# Patient Record
Sex: Female | Born: 1995 | Race: White | Hispanic: No | Marital: Married | Smoking: Former smoker
Health system: Southern US, Community
[De-identification: ages and names within clinical notes are randomized; demographics above are authoritative.]

## PROBLEM LIST (undated history)

## (undated) ENCOUNTER — Inpatient Hospital Stay (HOSPITAL_COMMUNITY): Payer: Self-pay

## (undated) DIAGNOSIS — O009 Unspecified ectopic pregnancy without intrauterine pregnancy: Secondary | ICD-10-CM

## (undated) DIAGNOSIS — D649 Anemia, unspecified: Secondary | ICD-10-CM

## (undated) DIAGNOSIS — Z3493 Encounter for supervision of normal pregnancy, unspecified, third trimester: Secondary | ICD-10-CM

## (undated) DIAGNOSIS — I491 Atrial premature depolarization: Secondary | ICD-10-CM

## (undated) DIAGNOSIS — I493 Ventricular premature depolarization: Secondary | ICD-10-CM

## (undated) DIAGNOSIS — R51 Headache: Secondary | ICD-10-CM

## (undated) DIAGNOSIS — B999 Unspecified infectious disease: Secondary | ICD-10-CM

## (undated) DIAGNOSIS — F329 Major depressive disorder, single episode, unspecified: Secondary | ICD-10-CM

## (undated) DIAGNOSIS — R002 Palpitations: Secondary | ICD-10-CM

## (undated) DIAGNOSIS — Z3490 Encounter for supervision of normal pregnancy, unspecified, unspecified trimester: Secondary | ICD-10-CM

## (undated) DIAGNOSIS — N159 Renal tubulo-interstitial disease, unspecified: Secondary | ICD-10-CM

## (undated) DIAGNOSIS — F913 Oppositional defiant disorder: Secondary | ICD-10-CM

## (undated) DIAGNOSIS — F419 Anxiety disorder, unspecified: Secondary | ICD-10-CM

## (undated) DIAGNOSIS — F32A Depression, unspecified: Secondary | ICD-10-CM

## (undated) DIAGNOSIS — F988 Other specified behavioral and emotional disorders with onset usually occurring in childhood and adolescence: Secondary | ICD-10-CM

## (undated) DIAGNOSIS — Z20828 Contact with and (suspected) exposure to other viral communicable diseases: Secondary | ICD-10-CM

## (undated) DIAGNOSIS — J45909 Unspecified asthma, uncomplicated: Secondary | ICD-10-CM

## (undated) HISTORY — PX: ANKLE SURGERY: SHX546

## (undated) HISTORY — DX: Oppositional defiant disorder: F91.3

## (undated) HISTORY — DX: Depression, unspecified: F32.A

## (undated) HISTORY — PX: OTHER SURGICAL HISTORY: SHX169

## (undated) HISTORY — PX: WISDOM TOOTH EXTRACTION: SHX21

## (undated) HISTORY — DX: Encounter for supervision of normal pregnancy, unspecified, unspecified trimester: Z34.90

## (undated) HISTORY — DX: Unspecified ectopic pregnancy without intrauterine pregnancy: O00.90

## (undated) HISTORY — DX: Anxiety disorder, unspecified: F41.9

## (undated) HISTORY — DX: Atrial premature depolarization: I49.1

## (undated) HISTORY — DX: Ventricular premature depolarization: I49.3

## (undated) HISTORY — DX: Major depressive disorder, single episode, unspecified: F32.9

## (undated) HISTORY — DX: Contact with and (suspected) exposure to other viral communicable diseases: Z20.828

## (undated) HISTORY — PX: APPENDECTOMY: SHX54

## (undated) HISTORY — DX: Unspecified asthma, uncomplicated: J45.909

## (undated) HISTORY — DX: Palpitations: R00.2

---
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
## 2014-01-30 ENCOUNTER — Ambulatory Visit (HOSPITAL_COMMUNITY): Payer: Self-pay | Admitting: Psychiatry

## 2014-07-23 ENCOUNTER — Other Ambulatory Visit (HOSPITAL_COMMUNITY): Payer: Self-pay | Admitting: Psychiatry

## 2014-08-08 ENCOUNTER — Telehealth (HOSPITAL_COMMUNITY): Payer: Self-pay

## 2014-08-09 ENCOUNTER — Telehealth (HOSPITAL_COMMUNITY): Payer: Self-pay

## 2014-08-09 NOTE — Telephone Encounter (Signed)
Can be seen by adult psychiatrist and can see Forde RadonLeanne Yates for therapy

## 2014-08-21 ENCOUNTER — Ambulatory Visit (INDEPENDENT_AMBULATORY_CARE_PROVIDER_SITE_OTHER): Payer: 59 | Admitting: Psychiatry

## 2014-08-21 ENCOUNTER — Encounter (HOSPITAL_COMMUNITY): Payer: Self-pay | Admitting: Psychiatry

## 2014-08-21 VITALS — BP 112/81 | HR 89 | Ht 63.75 in | Wt 120.8 lb

## 2014-08-21 DIAGNOSIS — F900 Attention-deficit hyperactivity disorder, predominantly inattentive type: Secondary | ICD-10-CM

## 2014-08-21 DIAGNOSIS — F410 Panic disorder [episodic paroxysmal anxiety] without agoraphobia: Secondary | ICD-10-CM

## 2014-08-21 DIAGNOSIS — F331 Major depressive disorder, recurrent, moderate: Secondary | ICD-10-CM | POA: Insufficient documentation

## 2014-08-21 MED ORDER — FLUOXETINE HCL 20 MG PO CAPS: 60.0000 mg | ORAL_CAPSULE | Freq: Every day | ORAL | Status: DC

## 2014-08-21 MED ORDER — HYDROXYZINE HCL 10 MG PO TABS: ORAL_TABLET | ORAL | Status: DC

## 2014-08-21 MED ORDER — TRAZODONE HCL 50 MG PO TABS: 50.0000 mg | ORAL_TABLET | Freq: Every day | ORAL | Status: DC

## 2014-08-21 NOTE — Progress Notes (Signed)
[facility] [id number] Progress Note  Allison Bernard [id number] 18 y.o.   Chief Complaint: I am having a lot of bad panic attacks for last 1.5 months.   History of Present Illness:  Pt is having panic attacks every 2-3 days. Reports SOB, shaking, nausea, crying, headaches, FOI, nervous. Symptoms last for few minutes to hours. During the day she can distract herself but at night it goes on for hours.   Reports stressors including- beauty school, problems with fiance and her family, finances.   Depression comes and goes and is related to her panic attacks. On bad days she feels very low. Reports low self esteem, worthlessness and some anhedonia. Sleep is good with Trazodone. Appetite and energy are variable. Concentration is good.  Irritability is high. Denies AVH. Denies grandiouse thougths and ideas of reference.   Suicidal Ideation: No, last time was one week ago Plan Formed: No Patient has means to carry out plan: No  Homicidal Ideation: No Plan Formed: No Patient has means to carry out plan: No  Review of Systems:Review of Systems  Constitutional: Positive for chills. Negative for fever.  HENT: Positive for congestion and nosebleeds. Negative for sore throat.   Eyes: Negative for blurred vision, double vision and redness.  Respiratory: Negative for cough, sputum production and shortness of breath.   Cardiovascular: Positive for chest pain and palpitations. Negative for leg swelling.  Gastrointestinal: Negative.  Negative for heartburn, nausea, vomiting and abdominal pain.  Musculoskeletal: Positive for joint pain. Negative for back pain and neck pain.  Skin: Negative for itching and rash.  Neurological: Positive for headaches. Negative for dizziness, tremors, seizures, loss of consciousness and weakness.  Psychiatric/Behavioral: Positive for depression. Negative for suicidal ideas, hallucinations and substance abuse. The patient is nervous/anxious. The patient does not  have insomnia.       Past Medical Family, Social History: Pt is in beauty school.She lives on her parents property with her fiance.  reports that she has never smoked. She has never used smokeless tobacco. She reports that she drinks alcohol. She reports that she does not use illicit drugs.  Family History  Problem Relation Age of Onset  . Depression Mother   . Anxiety disorder Mother    Past Medical History  Diagnosis Date  . Anxiety   . Depression   . Oppositional defiant disorder   . ADD (attention deficit disorder)   . Kidney infection   . Headache(784.0)     had migraines when she was younger  . Anemia      Outpatient Encounter Prescriptions as of 08/21/2014  Medication Sig  . FLUoxetine (PROZAC) 40 MG capsule TAKE 1 CAPSULE BY MOUTH DAILY.  Marland Kitchen. Norethindrone-Ethinyl Estradiol-Fe Biphas (LO LOESTRIN FE) 1 MG-10 MCG / 10 MCG tablet Take 1 tablet by mouth daily.  . traZODone (DESYREL) 50 MG tablet TAKE 1 TABLET BY MOUTH AT BEDTIME.  Marland Kitchen. HYDROcodone-acetaminophen (NORCO) 5-325 MG per tablet Take 1 tablet by mouth every 6 (six) hours as needed for pain. (Patient not taking: Reported on 08/21/2014)  . ibuprofen (ADVIL,MOTRIN) 200 MG tablet Take 400 mg by mouth every 6 (six) hours as needed for pain.     Past Psychiatric History/Hospitalization(s): Anxiety: Yes Bipolar Disorder: No Depression: Yes Mania: No Psychosis: No Schizophrenia: No Personality Disorder: No Hospitalization for psychiatric illness: No History of Electroconvulsive Shock Therapy: No Prior Suicide Attempts: No  Physical Exam: Constitutional:  BP 112/81 mmHg  Pulse 89  Ht 5' 3.75" (1.619 m)  Wt 120  lb 12.8 oz (54.795 kg)  BMI 20.90 kg/m2  General Appearance: alert, oriented, no acute distress  Musculoskeletal: Strength & Muscle Tone: within normal limits Gait & Station: normal Patient leans: N/A  Mental Status Examination/Evaluation: Objective: Attitude: Calm and cooperative  Appearance:  Casual, appears to be stated age  Eye Contact::  Good  Speech:  Clear and Coherent and Normal Rate  Volume:  Normal  Mood:  anxious  Affect:  Congruent  Thought Process:  Goal Directed  Orientation:  Full (Time, Place, and Person)  Thought Content:  Negative  Suicidal Thoughts:  No  Homicidal Thoughts:  No  Judgement:  Fair  Insight:  Fair  Concentration: good  Memory: Immediate-fair Recent-fair Remote-fair  Recall: fair  Language: fair  Gait and Station: normal  [employer] of Knowledge: average  Psychomotor Activity:  Normal  Akathisia:  No  Handed:  Right  AIMS (if indicated):  n/a  Assets:  Communication Skills Desire for Improvement Housing Intimacy Leisure Time Physical Health Resilience Social Support Advertising account executiveTalents/Skills Transportation Vocational/Educational     Medical Decision Making (Choose Three): Review of Psycho-Social Stressors (1), Established Problem, Worsening (2), Review of Last Therapy Session (1), Review of Medication Regimen & Side Effects (2) and Review of New Medication or Change in Dosage (2)   Assessment: Axis I: major depressive disorder- recurrent, moderate; 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 Anxiety   . Depression   . Oppositional defiant disorder   . ADD (attention deficit disorder)   . Kidney infection   . Headache(784.0)     had migraines when she was younger  . Anemia      Axis IV: Educational issues, limited coping skills  Axis V: 65   Plan:Continue trazodone 50 mg at bedtime as needed for sleep. Increase Prozac 60 mg qAM for anxiety and depression Start trial of Vistaril 10-20mg  po BID prn anxiety  Medication management with supportive therapy. Risks/benefits and SE of the medication discussed. Pt verbalized understanding and verbal consent obtained for treatment.  Affirm with the patient that the medications  are taken as ordered. Patient expressed understanding of how their medications were to be used.  -worsening of symptoms  Labs: order at next visit  Therapy: brief supportive therapy provided. Discussed psychosocial stressors in detail.   Encouraged to restart individual therapy  Pt denies SI and is at an acute low risk for suicide.Patient told to call clinic if any problems occur. Patient advised to go to ER if they should develop SI/HI, side effects, or if symptoms worsen. Has crisis numbers to call if needed. Pt verbalized understanding.  F/up in 2 months or sooner if needed   Oletta DarterAGARWAL, , MD 08/21/2014

## 2014-08-31 ENCOUNTER — Encounter (HOSPITAL_COMMUNITY): Payer: Self-pay

## 2014-08-31 ENCOUNTER — Emergency Department (INDEPENDENT_AMBULATORY_CARE_PROVIDER_SITE_OTHER)
Admission: EM | Admit: 2014-08-31 | Discharge: 2014-08-31 | Disposition: A | Discharge status: Home / Self Care | Payer: 59 | Source: Home / Self Care | Attending: Family Medicine | Admitting: Family Medicine

## 2014-08-31 DIAGNOSIS — F410 Panic disorder [episodic paroxysmal anxiety] without agoraphobia: Secondary | ICD-10-CM

## 2014-08-31 DIAGNOSIS — F419 Anxiety disorder, unspecified: Secondary | ICD-10-CM

## 2014-08-31 LAB — POCT PREGNANCY, URINE: Preg Test, Ur: NEGATIVE

## 2014-08-31 LAB — POCT URINALYSIS DIP (DEVICE)
Bilirubin Urine: NEGATIVE
GLUCOSE, UA: NEGATIVE mg/dL
Hgb urine dipstick: NEGATIVE
Ketones, ur: NEGATIVE mg/dL
Leukocytes, UA: NEGATIVE
NITRITE: NEGATIVE
PH: 6.5 (ref 5.0–8.0)
Protein, ur: NEGATIVE mg/dL
Specific Gravity, Urine: 1.015 (ref 1.005–1.030)
Urobilinogen, UA: 1 mg/dL (ref 0.0–1.0)

## 2014-08-31 NOTE — ED Notes (Signed)
C/o dizzy , shaky x couple of days . Has a recent dental abscess (started on amoxicillin yesterday)

## 2014-08-31 NOTE — Discharge Instructions (Signed)
Panic Attacks Recommend take a vistaril when you get home Panic attacks are sudden, short-livedsurges of severe anxiety, fear, or discomfort. They may occur for no reason when you are relaxed, when you are anxious, or when you are sleeping. Panic attacks may occur for a number of reasons:   Healthy people occasionally have panic attacks in extreme, life-threatening situations, such as war or natural disasters. Normal anxiety is a protective mechanism of the body that helps us react to danger (fight or flight response).  Panic attacks are often seen with anxiety disorders, such as panic disorder, social anxiety disorder, generalized anxiety disorder, and phobias. Anxiety disorders cause excessive or uncontrollable anxiety. They may interfere with your relationships or other life activities.  Panic attacks are sometimes seen with other mental illnesses, such as depression and posttraumatic stress disorder.  Certain medical conditions, prescription medicines, and drugs of abuse can cause panic attacks. SYMPTOMS  Panic attacks start suddenly, peak within 20 minutes, and are accompanied by four or more of the following symptoms:  Pounding heart or fast heart rate (palpitations).  Sweating.  Trembling or shaking.  Shortness of breath or feeling smothered.  Feeling choked.  Chest pain or discomfort.  Nausea or strange feeling in your stomach.  Dizziness, light-headedness, or feeling like you will faint.  Chills or hot flushes.  Numbness or tingling in your lips or hands and feet.  Feeling that things are not real or feeling that you are not yourself.  Fear of losing control or going crazy.  Fear of dying. Some of these symptoms can mimic serious medical conditions. For example, you may think you are having a heart attack. Although panic attacks can be very scary, they are not life threatening. DIAGNOSIS  Panic attacks are diagnosed through an assessment by your health care  provider. Your health care provider will ask questions about your symptoms, such as where and when they occurred. Your health care provider will also ask about your medical history and use of alcohol and drugs, including prescription medicines. Your health care provider may order blood tests or other studies to rule out a serious medical condition. Your health care provider may refer you to a mental health professional for further evaluation. TREATMENT   Most healthy people who have one or two panic attacks in an extreme, life-threatening situation will not require treatment.  The treatment for panic attacks associated with anxiety disorders or other mental illness typically involves counseling with a mental health professional, medicine, or a combination of both. Your health care provider will help determine what treatment is best for you.  Panic attacks due to physical illness usually go away with treatment of the illness. If prescription medicine is causing panic attacks, talk with your health care provider about stopping the medicine, decreasing the dose, or substituting another medicine.  Panic attacks due to alcohol or drug abuse go away with abstinence. Some adults need professional help in order to stop drinking or using drugs. HOME CARE INSTRUCTIONS   Take all medicines as directed by your health care provider.   Schedule and attend follow-up visits as directed by your health care provider. It is important to keep all your appointments. SEEK MEDICAL CARE IF:  You are not able to take your medicines as prescribed.  Your symptoms do not improve or get worse. SEEK IMMEDIATE MEDICAL CARE IF:   You experience panic attack symptoms that are different than your usual symptoms.  You have serious thoughts about hurting yourself or others.  You are taking medicine for panic attacks and have a serious side effect. MAKE SURE YOU:  Understand these instructions.  Will watch your  condition.  Will get help right away if you are not doing well or get worse. Document Released: 09/14/2005 Document Revised: 09/19/2013 Document Reviewed: 04/28/2013 [facility] Patient Information 2015 [place], [place]. This information is not intended to replace advice given to you by your health care provider. Make sure you discuss any questions you have with your health care provider.

## 2014-08-31 NOTE — ED Provider Notes (Signed)
CSN: [id number]     Arrival date & time 08/31/14  1134 History   First MD Initiated Contact with Patient 08/31/14 1209     Chief Complaint  Patient presents with  . Dizziness   (Consider location/radiation/quality/duration/timing/severity/associated sxs/prior Treatment) HPI Comments: 18 year old female states that while in school, approximately one hour prior to arrival to the urgent care, she developed dizziness and lightheadedness. She felt like she may pass out. She had transient blurring of vision, hyperventilation and tingling of hands. The preponderance of the symptoms last 15-20 minutes. She still has some tingling in the hands and nausea. She endorses vomiting twice during this episode. She feels tired. She has a history of panic attacks and her psychiatrist increased her Prozac from 40 mg to 60 mg just a little over a week ago. In addition, she has a history of depression, oppositional defiant disorder, ADD and headaches. Other medicines include trazodone and Vistaril.   Past Medical History  Diagnosis Date  . Anxiety   . Depression   . Oppositional defiant disorder   . ADD (attention deficit disorder)   . Kidney infection   . Headache(784.0)     had migraines when she was younger  . Anemia    Past Surgical History  Procedure Laterality Date  . Tubes in ears      in the past  . Mass excision Left 07/04/2013    Procedure: EXCISION MASS;  Surgeon: Nadara MustardMarcus V Duda, MD;  [facility];  Service: Orthopedics;  Laterality: Left;  Excision Left Foot Talo-calcaneous fibrous bar  . Ankle surgery     Family History  Problem Relation Age of Onset  . Depression Mother   . Anxiety disorder Mother    History  Substance Use Topics  . Smoking status: Never Smoker   . Smokeless tobacco: Never Used  . Alcohol Use: Yes     Comment: 1 drink a month   OB History    No data available     Review of Systems  Constitutional: Positive for activity change. Negative for fever.  HENT:  Negative.   Eyes: Positive for visual disturbance.  Respiratory: Positive for shortness of breath. Negative for cough and wheezing.   Cardiovascular: Negative for chest pain and leg swelling.  Gastrointestinal: Positive for nausea and vomiting. Negative for abdominal pain.  Genitourinary: Positive for dysuria and urgency.  Musculoskeletal: Negative.   Skin: Negative.   Neurological: Positive for dizziness, light-headedness and numbness. Negative for seizures, syncope, speech difficulty and weakness.  Psychiatric/Behavioral: The patient is nervous/anxious.     Allergies  Review of patient's allergies indicates no known allergies.  Home Medications   Prior to Admission medications   Medication Sig Start Date End Date Taking? Authorizing Provider  FLUoxetine (PROZAC) 20 MG capsule Take 3 capsules (60 mg total) by mouth daily. 08/21/14  Yes Oletta DarterSalina Agarwal, MD  Norethindrone-Ethinyl Estradiol-Fe Biphas (LO LOESTRIN FE) 1 MG-10 MCG / 10 MCG tablet Take 1 tablet by mouth daily.   Yes Historical Provider, MD  traZODone (DESYREL) 50 MG tablet Take 1 tablet (50 mg total) by mouth at bedtime. 08/21/14  Yes Oletta DarterSalina Agarwal, MD  HYDROcodone-acetaminophen (NORCO) 5-325 MG per tablet Take 1 tablet by mouth every 6 (six) hours as needed for pain. Patient not taking: Reported on 08/21/2014 07/04/13   Nadara MustardMarcus Duda V, MD  hydrOXYzine (ATARAX/VISTARIL) 10 MG tablet Take 10-20mg  po BID prn anxiety 08/21/14   Oletta DarterSalina Agarwal, MD  ibuprofen (ADVIL,MOTRIN) 200 MG tablet Take 400 mg by mouth  every 6 (six) hours as needed for pain.     Historical Provider, MD   BP 113/74 mmHg  Pulse 92  Temp(Src) 98.4 F (36.9 C) (Oral)  Resp 14  SpO2 100%  LMP  (LMP Unknown) Physical Exam  Constitutional: She is oriented to person, place, and time. She appears well-developed and well-nourished. No distress.  HENT:  Right Ear: External ear normal.  Left Ear: External ear normal.  Mouth/Throat: Oropharynx is clear and moist.  No oropharyngeal exudate.  Eyes: Conjunctivae and EOM are normal. Pupils are equal, round, and reactive to light.  Neck: Normal range of motion. Neck supple.  Cardiovascular: Normal rate, regular rhythm, normal heart sounds and intact distal pulses.   No murmur heard. Pulmonary/Chest: Effort normal and breath sounds normal. No respiratory distress. She has no wheezes. She has no rales.  Abdominal: Soft. There is no tenderness.  Musculoskeletal: She exhibits no edema or tenderness.  Lymphadenopathy:    She has no cervical adenopathy.  Neurological: She is alert and oriented to person, place, and time. No cranial nerve deficit or sensory deficit. She exhibits normal muscle tone. She displays a negative Romberg sign. Coordination and gait normal.  Skin: Skin is warm and dry.  Psychiatric: She has a normal mood and affect.  Nursing note and vitals reviewed.   ED Course  Procedures (including critical care time) Labs Review Labs Reviewed  POCT PREGNANCY, URINE    Imaging Review No results found.   MDM   1. Panic attacks   2. Anxiety    Sx's typical of panic attacks. Pt did not recognize it as such because she could not identify a particular trigger. Exam unremarkable Home, [facility]    Hayden Rasmussenavid , NP 08/31/14 1253

## 2014-09-06 ENCOUNTER — Ambulatory Visit (HOSPITAL_COMMUNITY): Payer: Self-pay | Admitting: Psychiatry

## 2014-10-04 ENCOUNTER — Telehealth (HOSPITAL_COMMUNITY): Payer: Self-pay

## 2014-10-04 NOTE — Telephone Encounter (Signed)
Called and left voice message for pt to call back.

## 2014-10-11 NOTE — Telephone Encounter (Signed)
Called and left voice message for call back 

## 2014-10-25 ENCOUNTER — Encounter (HOSPITAL_COMMUNITY): Payer: Self-pay | Admitting: Psychiatry

## 2014-10-25 ENCOUNTER — Ambulatory Visit (INDEPENDENT_AMBULATORY_CARE_PROVIDER_SITE_OTHER): Payer: Commercial Indemnity | Admitting: Psychiatry

## 2014-10-25 VITALS — BP 118/82 | HR 93 | Ht 63.5 in | Wt 135.0 lb

## 2014-10-25 DIAGNOSIS — F900 Attention-deficit hyperactivity disorder, predominantly inattentive type: Secondary | ICD-10-CM

## 2014-10-25 DIAGNOSIS — F410 Panic disorder [episodic paroxysmal anxiety] without agoraphobia: Secondary | ICD-10-CM

## 2014-10-25 DIAGNOSIS — F331 Major depressive disorder, recurrent, moderate: Secondary | ICD-10-CM

## 2014-10-25 MED ORDER — PAROXETINE HCL 20 MG PO TABS: 20.0000 mg | ORAL_TABLET | Freq: Every day | ORAL | Status: DC

## 2014-10-25 MED ORDER — TRAZODONE HCL 50 MG PO TABS: 50.0000 mg | ORAL_TABLET | Freq: Every day | ORAL | Status: DC

## 2014-10-25 MED ORDER — HYDROXYZINE HCL 10 MG PO TABS: ORAL_TABLET | ORAL | Status: DC

## 2014-10-25 NOTE — Progress Notes (Signed)
[facility] [id number] Progress Note  Allison Bernard [id number] 19 y.o.   Chief Complaint: I am depressed and anxious  History of Present Illness:  Pt feels depressed at least 5 days a week for a few hours. She feels sad, frustrated and stressed. Reports frequent crying spells and low motivation. She has some anhednoia and isolation. She is having some hopelessness and worthlessness.   Sleep was ok until this week for unknown reasons. Takes Trazodone to help with sleep. She is getting about 4-5 hrs. Energy is low and appetite is increased. Concentration is ok.   Pt is having panic attacks 1-2x/week. They are random and stress induced. She is very anxious in social situations. Feels things are not going her way for the last few months. She takes Vistaril at night to help her calm down.   Reports stressors including- beauty school, problems with fiance and her family, finances, planning wedding and finding a job.   Irritability is a little better. Denies AVH. Denies grandiouse thougths and ideas of reference and compulsions.   Taking Trazodone and Prozac as prescribed and denies SE.   Suicidal Ideation: No Plan Formed: No Patient has means to carry out plan: No  Homicidal Ideation: No Plan Formed: No Patient has means to carry out plan: No  Review of Systems:Review of Systems  Constitutional: Negative for fever and chills.  HENT: Negative for congestion and sore throat.   Eyes: Negative for blurred vision, double vision and redness.  Respiratory: Negative for cough, sputum production and shortness of breath.   Cardiovascular: Negative for chest pain, palpitations and leg swelling.  Gastrointestinal: Negative for heartburn, nausea, vomiting and abdominal pain.  Musculoskeletal: Positive for joint pain. Negative for myalgias, back pain and neck pain.  Skin: Negative for itching and rash.  Neurological: Positive for headaches. Negative for dizziness, sensory change, focal  weakness, seizures and weakness.  Psychiatric/Behavioral: Positive for depression. Negative for suicidal ideas, hallucinations and substance abuse. The patient is nervous/anxious and has insomnia.       Past Medical Family, Social History: Pt is in beauty school.She lives on her parents property with her fiance.  reports that she has never smoked. She has never used smokeless tobacco. She reports that she drinks alcohol. She reports that she does not use illicit drugs.  Family History  Problem Relation Age of Onset  . Depression Mother   . Anxiety disorder Mother    Past Medical History  Diagnosis Date  . Anxiety   . Depression   . Oppositional defiant disorder   . ADD (attention deficit disorder)   . Kidney infection   . Headache(784.0)     had migraines when she was younger  . Anemia      Outpatient Encounter Prescriptions as of 10/25/2014  Medication Sig  . FLUoxetine (PROZAC) 20 MG capsule Take 3 capsules (60 mg total) by mouth daily.  Marland Kitchen HYDROcodone-acetaminophen (NORCO) 5-325 MG per tablet Take 1 tablet by mouth every 6 (six) hours as needed for pain.  . hydrOXYzine (ATARAX/VISTARIL) 10 MG tablet Take 10-20mg  po BID prn anxiety  . ibuprofen (ADVIL,MOTRIN) 200 MG tablet Take 400 mg by mouth every 6 (six) hours as needed for pain.   . Norethindrone-Ethinyl Estradiol-Fe Biphas (LO LOESTRIN FE) 1 MG-10 MCG / 10 MCG tablet Take 1 tablet by mouth daily.  . traZODone (DESYREL) 50 MG tablet Take 1 tablet (50 mg total) by mouth at bedtime.    Past Psychiatric History/Hospitalization(s): Anxiety: Yes Bipolar Disorder: No  Depression: Yes Mania: No Psychosis: No Schizophrenia: No Personality Disorder: No Hospitalization for psychiatric illness: No History of Electroconvulsive Shock Therapy: No Prior Suicide Attempts: No  Physical Exam: Constitutional:  BP 118/82 mmHg  Pulse 93  Ht 5' 3.5" (1.613 m)  Wt 135 lb (61.236 kg)  BMI 23.54 kg/m2  General Appearance: alert,  oriented, no acute distress  Musculoskeletal: Strength & Muscle Tone: within normal limits Gait & Station: normal Patient leans: N/A  Mental Status Examination/Evaluation: Objective: Attitude: Calm and cooperative  Appearance: Casual, appears to be stated age  Eye Contact::  Good  Speech:  Clear and Coherent and Normal Rate  Volume:  Normal  Mood:  Anxious and depressed  Affect:  Congruent  Thought Process:  Goal Directed  Orientation:  Full (Time, Place, and Person)  Thought Content:  Negative  Suicidal Thoughts:  No  Homicidal Thoughts:  No  Judgement:  Fair  Insight:  Fair  Concentration: good  Memory: Immediate-fair Recent-fair Remote-fair  Recall: fair  Language: fair  Gait and Station: normal  [employer] of Knowledge: average  Psychomotor Activity:  Normal  Akathisia:  No  Handed:  Right  AIMS (if indicated):  n/a  Assets:  Communication Skills Desire for Improvement Housing Intimacy Leisure Time Physical Health Resilience Social Support Advertising account executiveTalents/Skills Transportation Vocational/Educational     Medical Decision Making (Choose Three): Review of Psycho-Social Stressors (1), Established Problem, Worsening (2), Review of Last Therapy Session (1), Review of Medication Regimen & Side Effects (2) and Review of New Medication or Change in Dosage (2)   Assessment: Axis I: major depressive disorder- recurrent, moderate; 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 Anxiety   . Depression   . Oppositional defiant disorder   . ADD (attention deficit disorder)   . Kidney infection   . Headache(784.0)     had migraines when she was younger  . Anemia      Axis IV: Educational issues, limited coping skills  Axis V: 65   Plan:Continue trazodone 50 mg at bedtime as needed for sleep. D/c Prozac as pt feels it is no longer effective Start trial of  Paxil  20mg  qAM for anxiety and depression Vistaril 10-20mg  po BID prn anxiety  Medication management with supportive therapy. Risks/benefits and SE of the medication discussed. Pt verbalized understanding and verbal consent obtained for treatment.  Affirm with the patient that the medications are taken as ordered. Patient expressed understanding of how their medications were to be used.  -worsening of symptoms  Labs: order at next visit  Therapy: brief supportive therapy provided. Discussed psychosocial stressors in detail.   Encouraged to restart individual therapy  Pt denies SI and is at an acute low risk for suicide.Patient told to call clinic if any problems occur. Patient advised to go to ER if they should develop SI/HI, side effects, or if symptoms worsen. Has crisis numbers to call if needed. Pt verbalized understanding.  F/up in 2 months or sooner if needed   Oletta DarterAGARWAL, , MD 10/25/2014

## 2014-12-27 ENCOUNTER — Ambulatory Visit (HOSPITAL_COMMUNITY): Payer: Self-pay | Admitting: Psychiatry

## 2015-01-03 ENCOUNTER — Ambulatory Visit (INDEPENDENT_AMBULATORY_CARE_PROVIDER_SITE_OTHER): Payer: Commercial Indemnity | Admitting: Psychiatry

## 2015-01-03 ENCOUNTER — Encounter (HOSPITAL_COMMUNITY): Payer: Self-pay | Admitting: Psychiatry

## 2015-01-03 VITALS — BP 113/74 | HR 86 | Ht 63.0 in | Wt 146.8 lb

## 2015-01-03 DIAGNOSIS — Z0000 Encounter for general adult medical examination without abnormal findings: Secondary | ICD-10-CM

## 2015-01-03 DIAGNOSIS — F410 Panic disorder [episodic paroxysmal anxiety] without agoraphobia: Secondary | ICD-10-CM

## 2015-01-03 DIAGNOSIS — F331 Major depressive disorder, recurrent, moderate: Secondary | ICD-10-CM

## 2015-01-03 DIAGNOSIS — F900 Attention-deficit hyperactivity disorder, predominantly inattentive type: Secondary | ICD-10-CM | POA: Diagnosis not present

## 2015-01-03 MED ORDER — PAROXETINE HCL 40 MG PO TABS: 40.0000 mg | ORAL_TABLET | Freq: Every day | ORAL | Status: DC

## 2015-01-03 MED ORDER — HYDROXYZINE HCL 10 MG PO TABS: ORAL_TABLET | ORAL | Status: DC

## 2015-01-03 MED ORDER — TRAZODONE HCL 100 MG PO TABS: 100.0000 mg | ORAL_TABLET | Freq: Every day | ORAL | Status: DC

## 2015-01-03 NOTE — Progress Notes (Signed)
Patient ID: Allison KocherLauren A Bernard, female   DOB: 11/02/1995, 19 y.o.   MRN: [id number]  [facility] [id number] Progress Note  Allison KocherLauren A Bernard [id number] 19 y.o.   Chief Complaint: I am overall good  History of Present Illness:  Pt has graduated and [place] She has been pretty stressed out. Her uncle passed away suddenly earlier this week. She was not close to him and feels sad watching her dad feeling down.   Pt feels depressed at least 5 days a week for a few hours but is more situational. She feels sad, frustrated and stressed. Reports frequent crying spells and low motivation. She has some anhednoia and isolation. She is having some hopelessness and worthlessness. Overall depression has not changed much. States she has noticed much change with Paxil.   Sleep was ok until this week for unknown reasons. Takes Trazodone to help with sleep but it is not working as well as it used to. She is getting about 5-6 hrs. Energy is slowly improving and appetite is increased. Pt states she is gaining weight (10 lbs in 3 months). Pt thinks she might be stress eating and lack of activity.  Concentration is ok.   Pt is having panic attacks 1-2x/week. They are random and stress induced. She is very anxious in social situations. Feels things are not going her way for the last few months. She takes Vistaril at night to help her calm down. She can't take it during the day b/c it makes her tired.   Irritability is a little unchanged. Denies AVH. Denies grandiouse thougths and ideas of reference and compulsions.   Taking Trazodone and Paxil as prescribed and denies SE.   Suicidal Ideation: No Plan Formed: No Patient has means to carry out plan: No  Homicidal Ideation: No Plan Formed: No Patient has means to carry out plan: No  Review of Systems:Review of Systems  Constitutional: Negative for fever, chills and weight loss.  HENT: Positive for congestion and  sore throat. Negative for ear pain and hearing loss.   Eyes: Negative for blurred vision, double vision and pain.  Respiratory: Positive for cough. Negative for sputum production and wheezing.   Cardiovascular: Negative for chest pain, palpitations and leg swelling.  Gastrointestinal: Negative for heartburn, nausea, vomiting and abdominal pain.  Musculoskeletal: Positive for myalgias and joint pain. Negative for back pain and neck pain.  Skin: Negative for itching and rash.  Neurological: Negative for dizziness, sensory change, seizures, loss of consciousness and headaches.  Psychiatric/Behavioral: Positive for depression. Negative for suicidal ideas, hallucinations and substance abuse. The patient is nervous/anxious and has insomnia.       Past Medical Family, Social History: [place] She lives [place] her parents property with her fiance.  reports that she has never smoked. She has never used smokeless tobacco. She reports that she drinks alcohol. She reports that she does not use illicit drugs.  Family History  Problem Relation Age of Onset  . Depression Mother   . Anxiety disorder Mother    Past Medical History  Diagnosis Date  . Anxiety   . Depression   . Oppositional defiant disorder   . ADD (attention deficit disorder)   . Kidney infection   . Headache(784.0)     had migraines when she was younger  . Anemia      Outpatient Encounter Prescriptions as of 01/03/2015  Medication Sig  .  HYDROcodone-acetaminophen (NORCO) 5-325 MG per tablet Take 1 tablet by mouth every 6 (six) hours as needed for pain.  . hydrOXYzine (ATARAX/VISTARIL) 10 MG tablet Take 10-20mg  po BID prn anxiety  . ibuprofen (ADVIL,MOTRIN) 200 MG tablet Take 400 mg by mouth every 6 (six) hours as needed for pain.   . Norethindrone-Ethinyl Estradiol-Fe Biphas (LO LOESTRIN FE) 1 MG-10 MCG / 10 MCG tablet Take 1 tablet by mouth daily.  Marland Kitchen PARoxetine (PAXIL) 20 MG tablet Take 1 tablet (20  mg total) by mouth daily.  . traZODone (DESYREL) 50 MG tablet Take 1 tablet (50 mg total) by mouth at bedtime.    Past Psychiatric History/Hospitalization(s): Anxiety: Yes Bipolar Disorder: No Depression: Yes Mania: No Psychosis: No Schizophrenia: No Personality Disorder: No Hospitalization for psychiatric illness: No History of Electroconvulsive Shock Therapy: No Prior Suicide Attempts: No  Physical Exam: Constitutional:  BP 113/74 mmHg  Pulse 86  Ht  (1.6 m)  Wt 146 lb 12.8 oz (66.588 kg)  BMI 26.01 kg/m2  General Appearance: alert, oriented, no acute distress  Musculoskeletal: Strength & Muscle Tone: within normal limits Gait & Station: normal Patient leans: N/A  Mental Status Examination/Evaluation: Objective: Attitude: Calm and cooperative  Appearance: Casual, appears to be stated age  Eye Contact::  Good  Speech:  Clear and Coherent and Normal Rate  Volume:  Normal  Mood:  Anxious and depressed  Affect:  Congruent  Thought Process:  Goal Directed  Orientation:  Full (Time, Place, and Person)  Thought Content:  Negative  Suicidal Thoughts:  No  Homicidal Thoughts:  No  Judgement:  Fair  Insight:  Fair  Concentration: good  Memory: Immediate-fair Recent-fair Remote-fair  Recall: fair  Language: fair  Gait and Station: normal  [employer] of Knowledge: average  Psychomotor Activity:  Normal  Akathisia:  No  Handed:  Right  AIMS (if indicated):  n/a  Assets:  Communication Skills Desire for Improvement Housing Intimacy Leisure Time Physical Health Resilience Social Support Advertising account executive (Choose Three): Review of Psycho-Social Stressors (1), Review or order clinical lab tests (1), Established Problem, Worsening (2), Review of Last Therapy Session (1), Review of Medication Regimen & Side Effects (2) and Review of New Medication or Change in Dosage (2)   Assessment: Axis  I: major depressive disorder- recurrent, moderate; 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 Anxiety   . Depression   . Oppositional defiant disorder   . ADD (attention deficit disorder)   . Kidney infection   . Headache(784.0)     had migraines when she was younger  . Anemia      Axis IV: Educational issues, limited coping skills  Axis V: 65   Plan: Increase Trazodone to  at bedtime as needed for sleep. Increase Paxil to  qAM for anxiety and depression. Pt is not pregnant and is not actively trying to get pregnant at this time but is concerned about the possibility. We discussed teratogenic risks and pt  Verbalized understanding and opted to continue. She will inform us if she gets pregnant and plans to stop using all MH meds at that point in time.  Vistaril 10-20mg  po BID prn anxiety  Medication management with supportive therapy. Risks/benefits and SE of the medication discussed. Pt verbalized understanding and verbal consent obtained for treatment.  Affirm with the patient that the medications are  taken as ordered. Patient expressed understanding of how their medications were to be used.  -worsening of symptoms  Labs: ordered CBC, CMP, TSH, UDS  Therapy: brief supportive therapy provided. Discussed psychosocial stressors in detail.   Encouraged to restart individual therapy  Pt denies SI and is at an acute low risk for suicide.Patient told to call clinic if any problems occur. Patient advised to go to ER if they should develop SI/HI, side effects, or if symptoms worsen. Has crisis numbers to call if needed. Pt verbalized understanding.  F/up in 2 months or sooner if needed   Oletta Darter, MD 01/03/2015

## 2015-03-05 ENCOUNTER — Encounter (HOSPITAL_COMMUNITY): Payer: Self-pay | Admitting: Psychiatry

## 2015-03-05 ENCOUNTER — Ambulatory Visit (INDEPENDENT_AMBULATORY_CARE_PROVIDER_SITE_OTHER): Payer: Commercial Indemnity | Admitting: Psychiatry

## 2015-03-05 VITALS — BP 131/81 | HR 92 | Ht 63.0 in | Wt 150.0 lb

## 2015-03-05 DIAGNOSIS — F410 Panic disorder [episodic paroxysmal anxiety] without agoraphobia: Secondary | ICD-10-CM | POA: Diagnosis not present

## 2015-03-05 DIAGNOSIS — F900 Attention-deficit hyperactivity disorder, predominantly inattentive type: Secondary | ICD-10-CM

## 2015-03-05 DIAGNOSIS — F988 Other specified behavioral and emotional disorders with onset usually occurring in childhood and adolescence: Secondary | ICD-10-CM

## 2015-03-05 DIAGNOSIS — F331 Major depressive disorder, recurrent, moderate: Secondary | ICD-10-CM

## 2015-03-05 MED ORDER — TRAZODONE HCL 100 MG PO TABS
100.0000 mg | ORAL_TABLET | Freq: Every day | ORAL | Status: DC
Start: 2015-03-05 — End: 2015-07-25

## 2015-03-05 MED ORDER — PAROXETINE HCL 40 MG PO TABS: 60.0000 mg | ORAL_TABLET | Freq: Every day | ORAL | Status: DC

## 2015-03-05 MED ORDER — HYDROXYZINE HCL 10 MG PO TABS: ORAL_TABLET | ORAL | Status: DC

## 2015-03-05 NOTE — Progress Notes (Signed)
BH MD/PA/NP OP Progress Note  03/05/2015 9:43 AM Allison Bernard  MRN:  [id number]  Subjective:   Pt has been stressed due to her new job. She is training and they are moving her between 3 locations.   She still has 1-2 bad days a week of depression. On those days she is overwhelmed and is scared about her progress and future. She questions everything about herself. She feels tired and wants to spend her day in bed. On those days she has to force herself to do things. She is motivated at work but nothing else. Reports isolation and crying spells on bad days. Anehdonia remains. Denies worthlessness and hopelessness. Denies SI/HI.   Pt is stress eating and is concerned about her weight. Sleep is good with Vistaril and Trazodone. Energy is low.   Concentration is ok and she able to focus at work.   When overwhelmed and on bad days pt has stress induced panic attacks.   Irritability is a little unchanged. Denies AVH. Denies grandiouse thougths and ideas of reference and compulsions.   Taking meds as prescribed and denies SE.   Chief Complaint: good Chief Complaint    Follow-up     Visit Diagnosis:     ICD-9-CM ICD-10-CM   1. Panic disorder without agoraphobia 300.01 F41.0 hydrOXYzine (ATARAX/VISTARIL) 10 MG tablet     PARoxetine (PAXIL) 40 MG tablet  2. Major depressive disorder, recurrent episode, moderate 296.32 F33.1 PARoxetine (PAXIL) 40 MG tablet     traZODone (DESYREL) 100 MG tablet  3. 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 Anxiety   . Depression   . Oppositional defiant disorder   . ADD (attention deficit disorder)   . Kidney infection   . Headache(784.0)     had migraines when she was younger  . Anemia     Past Surgical History  Procedure Laterality Date  . Tubes in ears      in the past  . Mass excision Left 07/04/2013    Procedure: EXCISION MASS;  Surgeon: Nadara Mustard, MD;  Location: [facility] OR;  Service: Orthopedics;  Laterality: Left;  Excision Left Foot Talo-calcaneous fibrous bar  . Ankle surgery    . Wisdom tooth extraction     Family History:  Family History  Problem Relation Age of Onset  . Depression Mother   . Anxiety disorder Mother    Social History:  History   Social History  . Marital Status: Single    Spouse Name: N/A  . Number of Children: N/A  . Years of Education: N/A   Social History Main Topics  . Smoking status: Never Smoker   . Smokeless tobacco: Never Used  . Alcohol Use: Yes     Comment: 1 drink a month  . Drug Use: No  . Sexual Activity: Yes    Birth Control/ Protection: Condom, Pill   Other Topics Concern  . 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 She lives on her parents property with her fiance.  reports that she has never smoked. She has never used smokeless tobacco. She reports that she drinks alcohol. She reports that she does not use illicit drugs. Past Psychiatric History/Hospitalization(s): Anxiety: Yes Bipolar Disorder: No Depression: Yes Mania: No Psychosis: No Schizophrenia: No Personality Disorder: No Hospitalization for psychiatric illness: No History of Electroconvulsive Shock  Therapy: No Prior Suicide Attempts: No   Assessment:   Musculoskeletal: Strength & Muscle Tone: within normal limits Gait & Station: normal Patient leans: N/A  Psychiatric Specialty Exam: HPI  Review of Systems  Constitutional: Negative for fever, chills and weight loss.  HENT: Negative for congestion and sore throat.   Eyes: Negative for blurred vision, double vision and redness.  Respiratory: Negative for cough, shortness of breath and wheezing.   Cardiovascular: Negative for chest pain, palpitations and leg swelling.  Gastrointestinal: Negative for heartburn, nausea, vomiting and abdominal pain.  Musculoskeletal: Negative for back pain, joint  pain and neck pain.  Skin: Negative for itching and rash.  Neurological: Negative for dizziness, seizures, loss of consciousness and headaches.  Psychiatric/Behavioral: Positive for depression. Negative for suicidal ideas, hallucinations and substance abuse. The patient is nervous/anxious. The patient does not have insomnia.     Blood pressure 131/81, pulse 92, height 5\' 3"  (1.6 m), weight 150 lb (68.04 kg).Body mass index is 26.58 kg/(m^2).  General Appearance: Fairly Groomed  Eye Contact:  Good  Speech:  Clear and Coherent and Normal Rate  Volume:  Normal  Mood:  Depressed  Affect:  Congruent  Thought Process:  Goal Directed, Linear and Logical  Orientation:  Full (Time, Place, and Person)  Thought Content:  Negative  Suicidal Thoughts:  No  Homicidal Thoughts:  No  Memory:  Immediate;   Good Recent;   Good Remote;   Good  Judgement:  Good  Insight:  Good  Psychomotor Activity:  Normal  Concentration:  Good  Recall:  Good  Fund of Knowledge: Good  Language: Good  Akathisia:  No  Handed:  Right  AIMS (if indicated):  n/a  Assets:  Communication Skills Desire for Improvement Housing Intimacy Leisure Time Physical Health Resilience Social Support Talents/Skills Transportation Vocational/Educational  ADL's:  Intact  Cognition: WNL  Sleep:  good   Is the patient at risk to self?  No. Has the patient been a risk to self in the past 6 months?  No. Has the patient been a risk to self within the distant past?  No. Is the patient a risk to others?  No. Has the patient been a risk to others in the past 6 months?  No. Has the patient been a risk to others within the distant past?  No.  Current Medications: Current Outpatient Prescriptions  Medication Sig Dispense Refill  . HYDROcodone-acetaminophen (NORCO) 5-325 MG per tablet Take 1 tablet by mouth every 6 (six) hours as needed for pain. 30 tablet 0  . hydrOXYzine (ATARAX/VISTARIL) 10 MG tablet Take 10-20mg  po BID prn  anxiety 120 tablet 1  . ibuprofen (ADVIL,MOTRIN) 200 MG tablet Take 400 mg by mouth every 6 (six) hours as needed for pain.     . Norethindrone-Ethinyl Estradiol-Fe Biphas (LO LOESTRIN FE) 1 MG-10 MCG / 10 MCG tablet Take 1 tablet by mouth daily.    Marland Kitchen. PARoxetine (PAXIL) 40 MG tablet Take 1 tablet (40 mg total) by mouth daily. 30 tablet 1  . traZODone (DESYREL) 100 MG tablet Take 1 tablet (100 mg total) by mouth at bedtime. 30 tablet 1   No current facility-administered medications for this visit.    Medical Decision Making:  Established Problem, Stable/Improving (1), Review of Psycho-Social Stressors (1), Review or order clinical lab tests (1), Established Problem, Worsening (2), Review of Medication Regimen & Side Effects (2) and Review of New Medication or Change in Dosage (2)  Treatment Plan Summary:Medication management and Plan see  below  Axis I: Major depressive disorder- recurrent, moderate; Panic disorder without agorophobia; ADHD inattentive type moderate severity  Axis II: Deferred   Plan: Trazodone  at bedtime as needed for sleep. Increase Paxil to  qAM for anxiety and depression. Pt is not pregnant and is not actively trying to get pregnant at this time but is concerned about the possibility. We discussed teratogenic risks and pt Verbalized understanding and opted to continue. She will inform us if she gets pregnant and plans to stop using all MH meds at that point in time.  Vistaril 10-20mg  po BID prn anxiety Medication management with supportive therapy. Risks/benefits and SE of the medication discussed. Pt verbalized understanding and verbal consent obtained for treatment. Affirm with the patient that the medications are taken as ordered. Patient expressed understanding of how their medications were to be used.  -worsening of depression symptoms  Labs: ordered CBC, CMP, TSH, UDS. Pt reminded to get labs done   Therapy: brief supportive therapy provided.  Discussed psychosocial stressors in detail.  Encouraged to restart individual therapy  Pt denies SI and is at an acute low risk for suicide.Patient told to call clinic if any problems occur. Patient advised to go to ER if they should develop SI/HI, side effects, or if symptoms worsen. Has crisis numbers to call if needed. Pt verbalized understanding.  F/up in 6 months or sooner if needed           ,  03/05/2015, 9:43 AM

## 2015-03-30 ENCOUNTER — Inpatient Hospital Stay (HOSPITAL_COMMUNITY)
Admission: AD | Admit: 2015-03-30 | Discharge: 2015-03-30 | Disposition: A | Discharge status: Home / Self Care | Payer: Managed Care, Other (non HMO) | Source: Ambulatory Visit | Attending: Obstetrics and Gynecology | Admitting: Obstetrics and Gynecology

## 2015-03-30 ENCOUNTER — Encounter (HOSPITAL_COMMUNITY): Payer: Self-pay

## 2015-03-30 DIAGNOSIS — O009 Ectopic pregnancy, unspecified: Secondary | ICD-10-CM | POA: Diagnosis present

## 2015-03-30 LAB — CBC WITH DIFFERENTIAL/PLATELET
BASOS ABS: 0.1 10*3/uL (ref 0.0–0.1)
Basophils Relative: 1 % (ref 0–1)
Eosinophils Absolute: 0.1 10*3/uL (ref 0.0–0.7)
Eosinophils Relative: 1 % (ref 0–5)
HEMATOCRIT: 40.5 % (ref 36.0–46.0)
HEMOGLOBIN: 13.7 g/dL (ref 12.0–15.0)
LYMPHS ABS: 1.8 10*3/uL (ref 0.7–4.0)
Lymphocytes Relative: 23 % (ref 12–46)
MCH: 27.6 pg (ref 26.0–34.0)
MCHC: 33.8 g/dL (ref 30.0–36.0)
MCV: 81.5 fL (ref 78.0–100.0)
MONO ABS: 0.5 10*3/uL (ref 0.1–1.0)
Monocytes Relative: 6 % (ref 3–12)
NEUTROS ABS: 5.5 10*3/uL (ref 1.7–7.7)
Neutrophils Relative %: 69 % (ref 43–77)
PLATELETS: 317 10*3/uL (ref 150–400)
RBC: 4.97 MIL/uL (ref 3.87–5.11)
RDW: 13 % (ref 11.5–15.5)
WBC: 7.9 10*3/uL (ref 4.0–10.5)

## 2015-03-30 LAB — ABO/RH
ABO/RH(D): O POS
Weak D: POSITIVE

## 2015-03-30 LAB — BUN: BUN: 11 mg/dL (ref 6–20)

## 2015-03-30 LAB — CREATININE, SERUM
Creatinine, Ser: 0.93 mg/dL (ref 0.44–1.00)
GFR calc non Af Amer: >60 mL/min (ref >60)

## 2015-03-30 LAB — AST: AST: 16 U/L (ref 15–41)

## 2015-03-30 LAB — HCG, QUANTITATIVE, PREGNANCY: hCG, Beta Chain, Quant, S: 158 m[IU]/mL — ABNORMAL HIGH (ref <5)

## 2015-03-30 MED ORDER — METHOTREXATE INJECTION FOR WOMEN'S HOSPITAL
50.0000 mg/m2 | Freq: Once | INTRAMUSCULAR | Status: AC
  Administered 2015-03-30: 85 mg via INTRAMUSCULAR
  Filled 2015-03-30: qty 1.7

## 2015-03-30 NOTE — MAU Note (Signed)
Urine in lab 

## 2015-03-30 NOTE — Discharge Instructions (Signed)
Return to Maternity Admissions Wednesday and Saturday for repeat BHCG   Methotrexate Treatment for an Ectopic Pregnancy Methotrexate is a medicine that treats ectopic pregnancy by stopping the growth of the fertilized egg. It also helps your body absorb tissue from the egg. This takes between 2 weeks and 6 weeks. Most ectopic pregnancies can be successfully treated with methotrexate if they are detected early enough. LET [facility] PROVIDER KNOW ABOUT:  Any allergies you have.  All medicines you are taking, including vitamins, herbs, eye drops, creams, and over-the-counter medicines.  Medical conditions you have. RISKS AND COMPLICATIONS Generally, this is a safe treatment. However, as with any treatment, problems can occur. Possible problems or side effects include:  Nausea.  Vomiting.  Diarrhea.  Abdominal cramping.  Mouth sores.  Increased vaginal bleeding or spotting.   Swelling or irritation of the lining of your lungs (pneumonitis).  Failed treatment and continuation of the pregnancy.   Liver damage.  Hair loss. There is still a risk of the ectopic pregnancy rupturing while using the methotrexate. BEFORE THE PROCEDURE Before you take the medicine:   Liver tests, kidney tests, and a complete blood test are performed.  Blood tests are performed to measure the pregnancy hormone levels and to determine your blood type.  If you are Rh-negative and the father is Rh-positive or his Rh type is not known, you will be given a Rho (D) immune globulin shot. PROCEDURE  There are two methods that your health care provider may use to prescribe methotrexate. One method involves a single dose or injection of the medicine. Another method involves a series of doses given through several injections.  AFTER THE PROCEDURE  You may have some abdominal cramping, vaginal bleeding, and fatigue in the first few days after taking methotrexate.  Blood tests will be taken for several  weeks to check the pregnancy hormone levels. The blood tests are performed until there is no more pregnancy hormone detected in the blood. Document Released: 09/08/2001 Document Revised: 01/29/2014 Document Reviewed: 07/03/2013 [facility] Patient Information 2015 [place]ExitCare, [place]LL[place]. This information is not intended to replace advice given to you by your health care provider. Make sure you discuss any questions you have with your health care provider.

## 2015-03-30 NOTE — MAU Note (Signed)
Pt states was seen in office yesterday. Dx'd with ectopic Wednesday, and went back to office yesterday. Was told to come to MAU for MTX. No pain. Has r sided ectopic.

## 2015-03-30 NOTE — Progress Notes (Addendum)
Notified of pt's lab results, will place order for MTX and have pt return to MAU day 4 and 7 for repeat BHCG.

## 2015-03-30 NOTE — Progress Notes (Signed)
Notified pt here for MTX. Orders obtained, will call with lab results.

## 2015-03-30 NOTE — MAU Provider Note (Signed)
History     CSN: [id number]  Arrival date and time: 03/30/15 1027   None     Chief Complaint  Patient presents with  . Sent from office for MTX    HPI Comments: Pt sent from office for MTX treatment of suspected ectopic pregnancy.  Pt initially seen with +UPT, conceived on OCP's.  A beta quant was noted to be 165 on 03/27/15 and and US performed showed  Nothing in the uterus and a right adnexal mass measuring 1.5 x 1.6 x 1.3 cm.  The patient reports intermittent right LQ pain but nothing severe or persistent.  She had a f/u beta on 03/29/15 which dropped to 158 c/w a failed pregnancy.  She stopped her OCP's 6/29 and began to have a heavy period-like bleed.  She presents this AM for MTX and the beta is unchanged at 158 despite her bleeding.  She reports no severe pain.  Pt will proceed with MTX today and return for Day 4 and Day 7 beta levels.  Ectopic precautions have been reviewed with patient.       Past Medical History  Diagnosis Date  . Anxiety   . Depression   . Oppositional defiant disorder   . ADD (attention deficit disorder)   . Kidney infection   . Headache(784.0)     had migraines when she was younger  . Anemia     Past Surgical History  Procedure Laterality Date  . Tubes in ears      in the past  . Mass excision Left 07/04/2013    Procedure: EXCISION MASS;  Surgeon: Nadara MustardMarcus V Duda, MD;  [facility];  Service: Orthopedics;  Laterality: Left;  Excision Left Foot Talo-calcaneous fibrous bar  . Ankle surgery    . Wisdom tooth extraction      Family History  Problem Relation Age of Onset  . Depression Mother   . Anxiety disorder Mother     History  Substance Use Topics  . Smoking status: Never Smoker   . Smokeless tobacco: Never Used  . Alcohol Use: Yes     Comment: 1 drink a month    Allergies: No Known Allergies  Prescriptions prior to admission  Medication Sig Dispense Refill Last Dose  . HYDROcodone-acetaminophen (NORCO) 5-325 MG per tablet Take 1  tablet by mouth every 6 (six) hours as needed for pain. 30 tablet 0 Taking  . hydrOXYzine (ATARAX/VISTARIL) 10 MG tablet Take 10-20mg  po BID prn anxiety 60 tablet 5   . ibuprofen (ADVIL,MOTRIN) 200 MG tablet Take 400 mg by mouth every 6 (six) hours as needed for pain.    Taking  . Norethindrone-Ethinyl Estradiol-Fe Biphas (LO LOESTRIN FE) 1 MG-10 MCG / 10 MCG tablet Take 1 tablet by mouth daily.   Taking  . PARoxetine (PAXIL) 40 MG tablet Take 1.5 tablets (60 mg total) by mouth daily. 45 tablet 5   . traZODone (DESYREL) 100 MG tablet Take 1 tablet (100 mg total) by mouth at bedtime. 30 tablet 5     ROS Physical Exam   Blood pressure 119/77, pulse 89, temperature 98.4 F (36.9 C), temperature source Oral, resp. rate 18, height 5' 3.25" (1.607 m), weight 66.452 kg (146 lb 8 oz), last menstrual period 03/10/2015.  Physical Exam  Per MAU provider, medical screening exam WNL  MAU Course  Procedures     Assessment and Plan  Pt's labs WNL and beta 158, unchanged from yesterday despite VB.  Will proceed with MTX and f/u levels  day 4 and day 7.  Oliver Pila 03/30/2015, 12:35 PM

## 2015-04-03 ENCOUNTER — Inpatient Hospital Stay (HOSPITAL_COMMUNITY)
Admission: AD | Admit: 2015-04-03 | Discharge: 2015-04-03 | Disposition: A | Discharge status: Home / Self Care | Payer: Managed Care, Other (non HMO) | Source: Ambulatory Visit | Attending: Obstetrics and Gynecology | Admitting: Obstetrics and Gynecology

## 2015-04-03 DIAGNOSIS — O009 Unspecified ectopic pregnancy without intrauterine pregnancy: Secondary | ICD-10-CM

## 2015-04-03 LAB — HCG, QUANTITATIVE, PREGNANCY: HCG, BETA CHAIN, QUANT, S: 168 m[IU]/mL — AB (ref <5)

## 2015-04-03 NOTE — MAU Note (Signed)
Pt here for day 4 post MTX.  States is doing good, needs to leave for work.  Denies pain. Is now just spotting

## 2015-04-03 NOTE — Discharge Instructions (Signed)
Ectopic Pregnancy °An ectopic pregnancy happens when a fertilized egg grows outside the uterus. A pregnancy cannot live outside of the uterus. This problem often happens in the fallopian tube. It is often caused by damage to the fallopian tube. °If this problem is found early, you may be treated with medicine. If your tube tears or bursts open (ruptures), you will bleed inside. This is an emergency. You will need surgery. Get help right away.  °SYMPTOMS °You may have normal pregnancy symptoms at first. These include: °· Missing your period. °· Feeling sick to your stomach (nauseous). °· Being tired. °· Having tender breasts. °Then, you may start to have symptoms that are not normal. These include: °· Pain with sex (intercourse). °· Bleeding from the vagina. This includes light bleeding (spotting). °· Belly (abdomen) or lower belly cramping or pain. This may be felt on one side. °· A fast heartbeat (pulse). °· Passing out (fainting) after going poop (bowel movement). °If your tube tears, you may have symptoms such as: °· Really bad pain in the belly or lower belly. This happens suddenly. °· Dizziness. °· Passing out. °· Shoulder pain. °GET HELP RIGHT AWAY IF:  °You have any of these symptoms. This is an emergency. °MAKE SURE YOU: °· Understand these instructions. °· Will watch your condition. °· Will get help right away if you are not doing well or get worse. °Document Released: 12/11/2008 Document Revised: 09/19/2013 Document Reviewed: 04/26/2013 °ExitCare® Patient Information ©2015 Exit[employer], LLC. This information is not intended to replace advice given to you by your health care provider. Make sure you discuss any questions you have with your health care provider. ° °Methotrexate Treatment for an Ectopic Pregnancy, Care After °Refer to this sheet in the next few weeks. These instructions provide you with information on caring for yourself after your procedure. Your health care provider may also give you more  specific instructions. Your treatment has been planned according to current medical practices, but problems sometimes occur. Call your health care provider if you have any problems or questions after your procedure. °WHAT TO EXPECT AFTER THE PROCEDURE °You may have some abdominal cramping, vaginal bleeding, and fatigue in the first few days after taking methotrexate. Some other possible side effects of methotrexate include: °· Nausea. °· Vomiting. °· Diarrhea. °· Mouth sores. °· Swelling or irritation of the lining of your lungs (pneumonitis). °· Liver damage. °· Hair loss. °HOME CARE INSTRUCTIONS  °After you have received the methotrexate medicine, you need to be careful of your activities and watch your condition for several weeks. It may take 1 week before your hormone levels return to normal. °· Keep all follow-up appointments as directed by your health care provider. °· Avoid traveling too far away from your health care provider. °· Do not have sexual intercourse until your health care provider says it is safe to do so. °· You may resume your usual diet. °· Limit strenuous activity. °· Do not take folic acid, prenatal vitamins, or other vitamins that contain folic acid. °· Do not take aspirin, ibuprofen, or naproxen (nonsteroidal anti-inflammatory drugs [NSAIDs]). °· Do not drink alcohol. °SEEK MEDICAL CARE IF:  °· You cannot control your nausea and vomiting. °· You cannot control your diarrhea. °· You have sores in your mouth and want treatment. °· You need pain medicine for your abdominal pain. °· You have a rash. °· You are having a reaction to the medicine. °SEEK IMMEDIATE MEDICAL CARE IF:  °· You have increasing abdominal or pelvic pain. °·   You notice increased bleeding. °· You feel light-headed, or you faint. °· You have shortness of breath. °· Your heart rate increases. °· You have a cough. °· You have chills. °· You have a fever. °Document Released: 09/03/2011 Document Revised: 09/19/2013 Document  Reviewed: 07/03/2013 °ExitCare® Patient Information ©2015 Exit[employer], LLC. This information is not intended to replace advice given to you by your health care provider. Make sure you discuss any questions you have with your health care provider. ° °

## 2015-04-10 ENCOUNTER — Inpatient Hospital Stay (HOSPITAL_COMMUNITY)
Admission: AD | Admit: 2015-04-10 | Discharge: 2015-04-10 | Disposition: A | Discharge status: Home / Self Care | Payer: Managed Care, Other (non HMO) | Source: Ambulatory Visit | Attending: Obstetrics and Gynecology | Admitting: Obstetrics and Gynecology

## 2015-04-10 ENCOUNTER — Encounter (HOSPITAL_COMMUNITY): Payer: Self-pay | Admitting: *Deleted

## 2015-04-10 DIAGNOSIS — O009 Unspecified ectopic pregnancy without intrauterine pregnancy: Secondary | ICD-10-CM

## 2015-04-10 LAB — HCG, QUANTITATIVE, PREGNANCY: hCG, Beta Chain, Quant, S: 6 m[IU]/mL — ABNORMAL HIGH (ref <5)

## 2015-04-10 NOTE — MAU Note (Signed)
Patient presents for follow-up blood-work after methotrexate administration. Denies discharge or complications but scant bleeding noted.

## 2015-04-10 NOTE — MAU Provider Note (Signed)
History     CSN: [id number]  Arrival date and time: 04/10/15 1655   None     Chief Complaint  Patient presents with  . Follow up Blood work    HPI Allison Bernard 19 y.o. G1P0 @[redacted]w[redacted]d  presents to MAU for follow up bloodwork after methotrexate.  She denies any abdominal pain, headache, weakness.  She reports very scant vaginal bleeding from time to time.   OB History    Gravida Para Term Preterm AB TAB SAB Ectopic Multiple Living   1               Past Medical History  Diagnosis Date  . Anxiety   . Depression   . Oppositional defiant disorder   . ADD (attention deficit disorder)   . Kidney infection   . Headache(784.0)     had migraines when she was younger  . Anemia     Past Surgical History  Procedure Laterality Date  . Tubes in ears      in the past  . Mass excision Left 07/04/2013    Procedure: EXCISION MASS;  Surgeon: Nadara MustardMarcus V Duda, MD;  [facility];  Service: Orthopedics;  Laterality: Left;  Excision Left Foot Talo-calcaneous fibrous bar  . Ankle surgery    . Wisdom tooth extraction      Family History  Problem Relation Age of Onset  . Depression Mother   . Anxiety disorder Mother     History  Substance Use Topics  . Smoking status: Never Smoker   . Smokeless tobacco: Never Used  . Alcohol Use: Yes     Comment: 1 drink a month    Allergies: No Known Allergies  Prescriptions prior to admission  Medication Sig Dispense Refill Last Dose  . HYDROcodone-acetaminophen (NORCO) 5-325 MG per tablet Take 1 tablet by mouth every 6 (six) hours as needed for pain. 30 tablet 0 Taking  . hydrOXYzine (ATARAX/VISTARIL) 10 MG tablet Take 10-20mg  po BID prn anxiety 60 tablet 5   . ibuprofen (ADVIL,MOTRIN) 200 MG tablet Take 400 mg by mouth every 6 (six) hours as needed for pain.    Taking  . Norethindrone-Ethinyl Estradiol-Fe Biphas (LO LOESTRIN FE) 1 MG-10 MCG / 10 MCG tablet Take 1 tablet by mouth daily.   Taking  . PARoxetine (PAXIL) 40 MG tablet Take 1.5  tablets (60 mg total) by mouth daily. 45 tablet 5   . traZODone (DESYREL) 100 MG tablet Take 1 tablet (100 mg total) by mouth at bedtime. 30 tablet 5     ROS Pertinent ROS in HPI.  All other systems are negative.   Physical Exam   Blood pressure 114/56, pulse 77, temperature 98.3 F (36.8 C), temperature source Oral, resp. rate 16, height 5\' 3"  (1.6 m), weight 145 lb 4 oz (65.885 kg), last menstrual period 03/10/2015, unknown if currently breastfeeding.  Physical Exam  Constitutional: She is oriented to person, place, and time. She appears well-developed and well-nourished. No distress.  HENT:  Head: Normocephalic and atraumatic.  Eyes: EOM are normal.  Neck: Normal range of motion.  Cardiovascular: Normal rate.   Respiratory: Effort normal. No respiratory distress.  Neurological: She is alert and oriented to person, place, and time.  Psychiatric: She has a normal mood and affect.   Labs:  BHCG:  6/29: 165 7/2: 158 (received MTX) 7/6: 168 7/13: 6 MAU Course  Procedures  MDM Discussed with Dr. Mindi SlickerBanga whom is agreeable to discharge of patient.  Pt to follow up in  office next week if any symptoms continue.  MAU for severe symptoms  Assessment and Plan  A:Ectopic pregnancy  P: Discharge to home Continue good supportive care Follow up in office if bleeding continues Follow up in office for contraception Patient may return to MAU as needed or if her condition were to change or worsen   Bertram Denver 04/10/2015, 6:39 PM

## 2015-06-06 ENCOUNTER — Encounter (HOSPITAL_COMMUNITY): Payer: Self-pay | Admitting: Psychology

## 2015-06-06 ENCOUNTER — Ambulatory Visit (INDEPENDENT_AMBULATORY_CARE_PROVIDER_SITE_OTHER): Payer: Managed Care, Other (non HMO) | Admitting: Psychology

## 2015-06-06 DIAGNOSIS — F331 Major depressive disorder, recurrent, moderate: Secondary | ICD-10-CM | POA: Diagnosis not present

## 2015-06-06 DIAGNOSIS — F410 Panic disorder [episodic paroxysmal anxiety] without agoraphobia: Secondary | ICD-10-CM | POA: Diagnosis not present
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
## 2015-06-17 ENCOUNTER — Ambulatory Visit (INDEPENDENT_AMBULATORY_CARE_PROVIDER_SITE_OTHER): Payer: 59 | Admitting: Psychology

## 2015-06-17 DIAGNOSIS — F331 Major depressive disorder, recurrent, moderate: Secondary | ICD-10-CM

## 2015-06-17 DIAGNOSIS — F411 Generalized anxiety disorder: Secondary | ICD-10-CM | POA: Diagnosis not present

## 2015-06-17 NOTE — Progress Notes (Signed)
   THERAPIST PROGRESS NOTE  Session Time: 12.30pm-1.16pm  Participation Level: Active  Behavioral Response: Well GroomedAlertAnxious  Type of Therapy: Individual Therapy  Treatment Goals addressed: Diagnosis: GAD and goal 1.  Interventions: CBT and Supportive  Summary: Allison Bernard is a 19 y.o. female who presents with full and bright affect.  Pt reported that things went well last week picking up her things from last job and felt good about how she coped through.  Pt also reported that she is feeling more encouraged and confident about continuing to work and reports she has an interview Wednesday.  Pt reported that she enjoyed time in [place] with her boyfriend over weekend.  Pt reported that she does well when around others but does struggle w/ depressed feelings when she is by herself.  Pt discussed ways she could approach alone time so that more structured and also acceptance of feelings w/ focus on coping.  Pt liked the idea of initiating more stuctured family time w/ mom and sister duing the week.  Pt also discussed feeling anxious a lot lately w/out any trigger.  Pt did identify that boyfriend being very outgoing and how adjusting to being present in these interactions.  pt also increased awareness of impact long term stress was having on her system and takes time to recover.  Pt receptive to movement and grounding activities.   Suicidal/Homicidal: Nowithout intent/plan  Therapist Response: Assessed pt current functioning per pt report.  Processed w/pt her coping through stressful interaction last week and adding to her success cycle for confidence.  Explored w/pt her feelings of loneliness and how to not avoid alone time w/ sleeping and how to accept and focus on coping.  Discussed ways she can use her down time and how to initiate engaging with others.  Discussed impact of stress on mind and body and how practices that are soothing to nervous system will assist in recovery.  Explored  with pt current skills using through dance and how to supplement w/ mindful movement and grounding activities.   Plan: Return again in 1 weeks.  Diagnosis: GAD and MDD    YATES,LEANNE, [facility] 06/17/2015

## 2015-06-24 ENCOUNTER — Encounter (HOSPITAL_COMMUNITY): Payer: Self-pay | Admitting: Psychology

## 2015-06-24 ENCOUNTER — Ambulatory Visit (HOSPITAL_COMMUNITY): Payer: Self-pay | Admitting: Psychology

## 2015-06-27 ENCOUNTER — Encounter (HOSPITAL_COMMUNITY): Payer: Self-pay | Admitting: Psychology

## 2015-06-27 NOTE — Progress Notes (Signed)
Allison Bernard is a 19 y.o. female patient who didn't show for her appointment.  Letter sent.        [name], LPC

## 2015-07-11 ENCOUNTER — Ambulatory Visit (HOSPITAL_COMMUNITY): Payer: Self-pay | Admitting: Psychiatry

## 2015-07-25 ENCOUNTER — Encounter (HOSPITAL_COMMUNITY): Payer: Self-pay | Admitting: Psychiatry

## 2015-07-25 ENCOUNTER — Ambulatory Visit (INDEPENDENT_AMBULATORY_CARE_PROVIDER_SITE_OTHER): Payer: 59 | Admitting: Psychiatry

## 2015-07-25 VITALS — BP 122/94 | HR 84 | Ht 63.0 in | Wt 138.6 lb

## 2015-07-25 DIAGNOSIS — F900 Attention-deficit hyperactivity disorder, predominantly inattentive type: Secondary | ICD-10-CM

## 2015-07-25 DIAGNOSIS — F331 Major depressive disorder, recurrent, moderate: Secondary | ICD-10-CM

## 2015-07-25 DIAGNOSIS — F410 Panic disorder [episodic paroxysmal anxiety] without agoraphobia: Secondary | ICD-10-CM | POA: Diagnosis not present

## 2015-07-25 MED ORDER — HYDROXYZINE HCL 10 MG PO TABS: ORAL_TABLET | ORAL | Status: DC

## 2015-07-25 MED ORDER — PAROXETINE HCL 40 MG PO TABS: 80.0000 mg | ORAL_TABLET | Freq: Every day | ORAL | Status: DC

## 2015-07-25 NOTE — Progress Notes (Signed)
Patient ID: Allison Bernard, female   DOB: 07/11/1996, 19 y.o.   MRN: [id number] [facility]PA[facility]NP OP Progress Note  07/25/2015 9:15 AM Allison Bernard  MRN:  [id number]  Subjective:   Pt ended relationship with her fiance in June and one week later found out she had an ectopic pregnancy. She was treated with Methotrexate.  Pt was fired unknown reason in September.   Pt is in a new relationship. Pt will be moving to [place] and is looking for a new job. 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   Depression has been bad for the last months. Pt has increased panic attacks (almost daily) and low motivation. Pt is spending most of her time in bed. Reports anhedonia and crying spells. States depression is consuming and the worst it has ever been. Denies SI/HI but last week was having passive thoughts of death without plan or intent.   Pt is stress eating but has lost some weight. Sleep is poor even with Vistaril and Trazodone she is only getting about 5 hrs/night.  Energy is low and pt is napping during the day.   Concentration is scattered but since she isn't working it is not a big deal.   Irritability is getting worse. Denies AVH. Denies grandiouse thougths and ideas of reference and compulsions.   Taking meds as prescribed and denies SE.   Chief Complaint: good Chief Complaint    Follow-up     Visit Diagnosis:   No diagnosis found.  Past Medical History:  Past Medical History  Diagnosis Date  . Anxiety   . Depression   . Oppositional defiant disorder   . ADD (attention deficit disorder)   . Kidney infection   . Headache(784.0)     had migraines when she was younger  . Anemia   . Ectopic pregnancy     Past Surgical History  Procedure Laterality Date  . Tubes in ears      in the past  . Mass excision Left 07/04/2013    Procedure: EXCISION MASS;  Surgeon: Nadara MustardMarcus V Duda, MD;  [facility];  Service: Orthopedics;  Laterality: Left;  Excision Left Foot  Talo-calcaneous fibrous bar  . Ankle surgery    . Wisdom tooth extraction     Family History:  Family History  Problem Relation Age of Onset  . Depression Mother   . Anxiety disorder Mother    Social History:  Social History   Social History  . Marital Status: Single    Spouse Name: N/A  . Number of Children: N/A  . Years of Education: N/A   Social History Main Topics  . Smoking status: Never Smoker   . Smokeless tobacco: Never Used  . Alcohol Use: Yes     Comment: 1 drink a month  . Drug Use: No  . Sexual Activity: Yes    Birth Control/ Protection: Pill   Other Topics Concern  . None   Social History Narrative   Additional History: She lives on her parents property. Reports that she has never smoked. She has never used smokeless tobacco. She reports that she drinks alcohol. She reports that she does not use illicit drugs.   Past Psychiatric History/Hospitalization(s): Anxiety: Yes Bipolar Disorder: No Depression: Yes Mania: No Psychosis: No Schizophrenia: No Personality Disorder: No Hospitalization for psychiatric illness: No History of Electroconvulsive Shock Therapy: No Prior Suicide Attempts: No   Assessment:   Musculoskeletal: Strength & Muscle Tone: within normal  limits Gait & Station: normal Patient leans: N/A  Psychiatric Specialty Exam: HPI  Review of Systems  Constitutional: Negative for fever, chills and weight loss.  HENT: Negative for congestion and sore throat.   Eyes: Negative for blurred vision, double vision and redness.  Respiratory: Negative for cough, shortness of breath and wheezing.   Cardiovascular: Negative for chest pain, palpitations and leg swelling.  Gastrointestinal: Negative for heartburn, nausea, vomiting and abdominal pain.  Musculoskeletal: Negative for back pain, joint pain and neck pain.  Skin: Negative for itching and rash.  Neurological: Negative for dizziness, seizures, loss of consciousness and headaches.   Psychiatric/Behavioral: Positive for depression. Negative for suicidal ideas, hallucinations and substance abuse. The patient is nervous/anxious and has insomnia.     Blood pressure 122/94, pulse 84, height  (1.6 m), weight 138 lb 9.6 oz (62.869 kg), last menstrual period 03/10/2015, unknown if currently breastfeeding.Body mass index is 24.56 kg/(m^2).  General Appearance: Fairly Groomed  Eye Contact:  Good  Speech:  Clear and Coherent and Normal Rate  Volume:  Normal  Mood:  Depressed  Affect:  Congruent  Thought Process:  Goal Directed, Linear and Logical  Orientation:  Full (Time, Place, and Person)  Thought Content:  Negative  Suicidal Thoughts:  No  Homicidal Thoughts:  No  Memory:  Immediate;   Good Recent;   Good Remote;   Good  Judgement:  Good  Insight:  Good  Psychomotor Activity:  Normal  Concentration:  Good  Recall:  Good  Fund of Knowledge: Good  Language: Good  Akathisia:  No  Handed:  Right  AIMS (if indicated):  n/a  Assets:  Communication Skills Desire for Improvement Housing Intimacy Leisure Time Physical Health Resilience Social Support Talents/Skills Transportation Vocational/Educational  ADL's:  Intact  Cognition: WNL  Sleep:  good   Is the patient at risk to self?  No. Has the patient been a risk to self in the past 6 months?  No. Has the patient been a risk to self within the distant past?  No. Is the patient a risk to others?  No. Has the patient been a risk to others in the past 6 months?  No. Has the patient been a risk to others within the distant past?  No.  Current Medications: Current Outpatient Prescriptions  Medication Sig Dispense Refill  . HYDROcodone-acetaminophen (NORCO) 5-325 MG per tablet Take 1 tablet by mouth every 6 (six) hours as needed for pain. 30 tablet 0  . hydrOXYzine (ATARAX/VISTARIL) 10 MG tablet Take 10-20mg  po BID prn anxiety 60 tablet 5  . ibuprofen (ADVIL,MOTRIN) 200 MG tablet Take 400 mg by mouth every  6 (six) hours as needed for pain.     . Norethindrone-Ethinyl Estradiol-Fe Biphas (LO LOESTRIN FE) 1 MG-10 MCG / 10 MCG tablet Take 1 tablet by mouth daily.    Marland Kitchen PARoxetine (PAXIL) 40 MG tablet Take 1.5 tablets (60 mg total) by mouth daily. 45 tablet 5  . traZODone (DESYREL) 100 MG tablet Take 1 tablet (100 mg total) by mouth at bedtime. 30 tablet 5   No current facility-administered medications for this visit.    Medical Decision Making:  Review of Psycho-Social Stressors (1), Established Problem, Worsening (2), Review of Medication Regimen & Side Effects (2) and Review of New Medication or Change in Dosage (2)  Treatment Plan Summary:Medication management and Plan see below  Axis I: Major depressive disorder- recurrent, moderate; Panic disorder without agorophobia; ADHD inattentive type moderate severity  Axis II: Deferred  Plan:  D/c Trazodone  Increase Paxil to  qAM for anxiety and depression. Pt is not pregnant and is not actively trying to get pregnant at this time but is concerned about the possibility. We discussed teratogenic risks and pt Verbalized understanding and opted to continue. She will inform us if she gets pregnant and plans to stop using all MH meds at that point in time.  Vistaril 10-20mg  po BID prn anxiety Medication management with supportive therapy. Risks/benefits and SE of the medication discussed. Pt verbalized understanding and verbal consent obtained for treatment. Affirm with the patient that the medications are taken as ordered. Patient expressed understanding of how their medications were to be used.  -worsening of depression symptoms  Labs: reviewed labs 03/30/2015 CBC WNL  Therapy: brief supportive therapy provided. Discussed psychosocial stressors in detail.  Encouraged to continue individual therapy  Pt denies SI and is at an acute low risk for suicide.Patient told to call clinic if any problems occur. Patient advised to go to ER if they  should develop SI/HI, side effects, or if symptoms worsen. Has crisis numbers to call if needed. Pt verbalized understanding.  F/up in 2 months or sooner if needed           ,  07/25/2015, 9:15 AM

## 2015-08-20 ENCOUNTER — Ambulatory Visit (INDEPENDENT_AMBULATORY_CARE_PROVIDER_SITE_OTHER): Payer: 59 | Admitting: Psychology

## 2015-08-20 DIAGNOSIS — F411 Generalized anxiety disorder: Secondary | ICD-10-CM

## 2015-08-20 NOTE — Progress Notes (Signed)
   THERAPIST PROGRESS NOTE  Session Time: 10.55am-11.37am  Participation Level: Active  Behavioral Response: Well GroomedAlertAnxious  Type of Therapy: Individual Therapy  Treatment Goals addressed: Diagnosis: GAD, MDD and goal 1.  Interventions: CBT and Strength-based  Summary: Allison Bernard is a 19 y.o. female who presents with full and bright affect today.  Pt reported that her depressive symptoms have improved since increase of medication.  Pt reported that she is planning [place] moving to [place] live [place]/ [place] boyfriend in February 2017.  Pt reported that she is excited about this but feels that she might "freak out" as time gets closer as first time leaving the area of "home".  Pt is able to reframe that she will experience anxiety, validating that but that she is preparing appropriately and has good supports and skills to manage.  Pt reports she is discouraged as doesn't have job yet.  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  Pt also reports that she does want to go back to school for early childhood education.  Pt discussed how she is planning for the move, how she has begun building a network there and has spent time there when her boyfriend has been working.  Pt expresses feeling confident about her transition.    Suicidal/Homicidal: Nowithout intent/plan  Therapist Response: Assessed pt current functioning per pt report.  Processed w/pt her anxiety about the move- normalized for the transition and had pt focus on her strength of preparing for this move and acclimating she has done.    Plan: Return again in 3 weeks.  Diagnosis: GAD    Forde RadonYATES,, [facility] 08/20/2015

## 2015-09-05 ENCOUNTER — Ambulatory Visit (HOSPITAL_COMMUNITY): Payer: Self-pay | Admitting: Psychiatry

## 2015-09-08 ENCOUNTER — Emergency Department (HOSPITAL_COMMUNITY): Payer: Managed Care, Other (non HMO)

## 2015-09-08 ENCOUNTER — Emergency Department (HOSPITAL_COMMUNITY)
Admission: EM | Admit: 2015-09-08 | Discharge: 2015-09-09 | Disposition: A | Discharge status: Home / Self Care | Payer: Managed Care, Other (non HMO) | Attending: Emergency Medicine | Admitting: Emergency Medicine

## 2015-09-08 ENCOUNTER — Encounter (HOSPITAL_COMMUNITY): Payer: Self-pay | Admitting: Emergency Medicine

## 2015-09-08 DIAGNOSIS — Z87448 Personal history of other diseases of urinary system: Secondary | ICD-10-CM | POA: Diagnosis not present

## 2015-09-08 DIAGNOSIS — R161 Splenomegaly, not elsewhere classified: Secondary | ICD-10-CM | POA: Diagnosis not present

## 2015-09-08 DIAGNOSIS — F419 Anxiety disorder, unspecified: Secondary | ICD-10-CM | POA: Diagnosis not present

## 2015-09-08 DIAGNOSIS — Z79818 Long term (current) use of other agents affecting estrogen receptors and estrogen levels: Secondary | ICD-10-CM | POA: Insufficient documentation

## 2015-09-08 DIAGNOSIS — Z8679 Personal history of other diseases of the circulatory system: Secondary | ICD-10-CM | POA: Diagnosis not present

## 2015-09-08 DIAGNOSIS — R079 Chest pain, unspecified: Secondary | ICD-10-CM | POA: Diagnosis present

## 2015-09-08 DIAGNOSIS — R0789 Other chest pain: Secondary | ICD-10-CM | POA: Diagnosis not present

## 2015-09-08 DIAGNOSIS — Z862 Personal history of diseases of the blood and blood-forming organs and certain disorders involving the immune mechanism: Secondary | ICD-10-CM | POA: Insufficient documentation

## 2015-09-08 DIAGNOSIS — J039 Acute tonsillitis, unspecified: Secondary | ICD-10-CM | POA: Insufficient documentation

## 2015-09-08 DIAGNOSIS — Z79899 Other long term (current) drug therapy: Secondary | ICD-10-CM | POA: Insufficient documentation

## 2015-09-08 DIAGNOSIS — R0602 Shortness of breath: Secondary | ICD-10-CM | POA: Insufficient documentation

## 2015-09-08 DIAGNOSIS — R945 Abnormal results of liver function studies: Secondary | ICD-10-CM

## 2015-09-08 DIAGNOSIS — B338 Other specified viral diseases: Secondary | ICD-10-CM | POA: Diagnosis not present

## 2015-09-08 DIAGNOSIS — B2790 Infectious mononucleosis, unspecified without complication: Secondary | ICD-10-CM | POA: Diagnosis not present

## 2015-09-08 DIAGNOSIS — E876 Hypokalemia: Secondary | ICD-10-CM | POA: Insufficient documentation

## 2015-09-08 DIAGNOSIS — F329 Major depressive disorder, single episode, unspecified: Secondary | ICD-10-CM | POA: Insufficient documentation

## 2015-09-08 DIAGNOSIS — R112 Nausea with vomiting, unspecified: Secondary | ICD-10-CM

## 2015-09-08 DIAGNOSIS — R7989 Other specified abnormal findings of blood chemistry: Secondary | ICD-10-CM

## 2015-09-08 DIAGNOSIS — R1012 Left upper quadrant pain: Secondary | ICD-10-CM

## 2015-09-08 LAB — COMPREHENSIVE METABOLIC PANEL
ALT: 60 U/L — ABNORMAL HIGH (ref 14–54)
AST: 166 U/L — AB (ref 15–41)
Albumin: 3.6 g/dL (ref 3.5–5.0)
Alkaline Phosphatase: 164 U/L — ABNORMAL HIGH (ref 38–126)
Anion gap: 11 (ref 5–15)
BILIRUBIN TOTAL: 1 mg/dL (ref 0.3–1.2)
BUN: 7 mg/dL (ref 6–20)
CHLORIDE: 98 mmol/L — AB (ref 101–111)
CO2: 27 mmol/L (ref 22–32)
Calcium: 8.9 mg/dL (ref 8.9–10.3)
Creatinine, Ser: 1.07 mg/dL — ABNORMAL HIGH (ref 0.44–1.00)
Glucose, Bld: 106 mg/dL — ABNORMAL HIGH (ref 65–99)
Potassium: 3 mmol/L — ABNORMAL LOW (ref 3.5–5.1)
Sodium: 136 mmol/L (ref 135–145)
TOTAL PROTEIN: 7.6 g/dL (ref 6.5–8.1)

## 2015-09-08 LAB — DIFFERENTIAL
Basophils Absolute: 0.3 10*3/uL — ABNORMAL HIGH (ref 0.0–0.1)
Basophils Relative: 3 %
EOS ABS: 0 10*3/uL (ref 0.0–0.7)
Eosinophils Relative: 0 %
LYMPHS ABS: 7.5 10*3/uL — AB (ref 0.7–4.0)
Lymphocytes Relative: 67 %
Monocytes Absolute: 1.2 10*3/uL — ABNORMAL HIGH (ref 0.1–1.0)
Monocytes Relative: 11 %
NEUTROS ABS: 2.1 10*3/uL (ref 1.7–7.7)
NEUTROS PCT: 19 %

## 2015-09-08 LAB — URINE MICROSCOPIC-ADD ON

## 2015-09-08 LAB — D-DIMER, QUANTITATIVE (NOT AT ARMC): D DIMER QUANT: 3.15 ug{FEU}/mL — AB (ref 0.00–0.50)

## 2015-09-08 LAB — URINALYSIS, ROUTINE W REFLEX MICROSCOPIC
Glucose, UA: NEGATIVE mg/dL
KETONES UR: NEGATIVE mg/dL
Nitrite: NEGATIVE
Protein, ur: 30 mg/dL — AB
Specific Gravity, Urine: 1.027 (ref 1.005–1.030)
pH: 6.5 (ref 5.0–8.0)

## 2015-09-08 LAB — I-STAT BETA HCG BLOOD, ED (MC, WL, AP ONLY)

## 2015-09-08 LAB — CBC
HCT: 39.4 % (ref 36.0–46.0)
Hemoglobin: 13.6 g/dL (ref 12.0–15.0)
MCH: 27.5 pg (ref 26.0–34.0)
MCHC: 34.5 g/dL (ref 30.0–36.0)
MCV: 79.6 fL (ref 78.0–100.0)
Platelets: 165 10*3/uL (ref 150–400)
RBC: 4.95 MIL/uL (ref 3.87–5.11)
RDW: 14 % (ref 11.5–15.5)
WBC: 11.1 10*3/uL — AB (ref 4.0–10.5)

## 2015-09-08 LAB — I-STAT TROPONIN, ED: TROPONIN I, POC: 0 ng/mL (ref 0.00–0.08)

## 2015-09-08 LAB — I-STAT CG4 LACTIC ACID, ED: LACTIC ACID, VENOUS: 0.88 mmol/L (ref 0.5–2.0)

## 2015-09-08 LAB — LIPASE, BLOOD: LIPASE: 38 U/L (ref 11–51)

## 2015-09-08 MED ORDER — ACETAMINOPHEN 325 MG PO TABS
650.0000 mg | ORAL_TABLET | Freq: Once | ORAL | Status: AC
  Administered 2015-09-08: 650 mg via ORAL
  Filled 2015-09-08: qty 2

## 2015-09-08 MED ORDER — SODIUM CHLORIDE 0.9 % IV BOLUS (SEPSIS)
1000.0000 mL | Freq: Once | INTRAVENOUS | Status: AC
  Administered 2015-09-08: 1000 mL via INTRAVENOUS

## 2015-09-08 MED ORDER — POTASSIUM CHLORIDE CRYS ER 20 MEQ PO TBCR
60.0000 meq | EXTENDED_RELEASE_TABLET | Freq: Once | ORAL | Status: AC
  Administered 2015-09-08: 60 meq via ORAL
  Filled 2015-09-08: qty 3

## 2015-09-08 MED ORDER — MORPHINE SULFATE (PF) 2 MG/ML IV SOLN
2.0000 mg | Freq: Once | INTRAVENOUS | Status: AC
Start: 2015-09-08 — End: 2015-09-08
  Administered 2015-09-08: 2 mg via INTRAVENOUS
  Filled 2015-09-08: qty 1

## 2015-09-08 MED ORDER — DEXAMETHASONE SODIUM PHOSPHATE 10 MG/ML IJ SOLN
10.0000 mg | Freq: Once | INTRAMUSCULAR | Status: AC
  Administered 2015-09-08: 10 mg via INTRAVENOUS
  Filled 2015-09-08: qty 1

## 2015-09-08 MED ORDER — IOHEXOL 350 MG/ML SOLN
100.0000 mL | Freq: Once | INTRAVENOUS | Status: AC | PRN
  Administered 2015-09-08: 100 mL via INTRAVENOUS

## 2015-09-08 MED ORDER — ONDANSETRON HCL 4 MG/2ML IJ SOLN
4.0000 mg | Freq: Once | INTRAMUSCULAR | Status: AC
  Administered 2015-09-08: 4 mg via INTRAVENOUS
  Filled 2015-09-08: qty 2

## 2015-09-08 NOTE — ED Provider Notes (Signed)
CSN: [id number]     Arrival date & time 09/08/15  1823 History   First MD Initiated Contact with Patient 09/08/15 1942     Chief Complaint  Patient presents with  . Chest Pain     (Consider location/radiation/quality/duration/timing/severity/associated sxs/prior Treatment) HPI Comments: Allison Bernard is a 19 y.o. female with a PMHx of ADD, ODD, depression, anxiety, headaches, anemia, ectopic pregnancy, and pyelonephritis, who presents to the ED with multiple complaints. Her primary complaint is left upper quadrant pain that began gradually yesterday and worsened today as well as nausea and vomiting. Patient states that she was seen in urgent care yesterday for a sore throat that have been ongoing for 2 weeks, she tested positive for mono and had a negative rapid strep test, she was given Zofran for nausea which has not helped. She states that today she developed 8/10 intermittent sharp left upper quadrant pain that radiated into the right side of her abdomen, worse with movement and coughing, and unrelieved with Tylenol. She had 3 episodes of nonbloody nonbilious emesis as well as nausea persisted after Zofran. She then developed right-sided chest pain approximately 45 minutes prior to arrival, but this only seemed to occur with coughing, and has completely subsided at this time. At that time she also felt short of breath mostly with exertion and none at rest, and she states that this is also resolved. She states that she continues to have chills, she had fevers last week but none since then, and continues to have the sore throat and a dry cough. She states her sore throat is now making it harder to swallow although she is still able to, and she denies any drooling or trismus.  She denies any ongoing fevers, ear pain or drainage, drooling, trismus,  rhinorrhea, eye itching or drainage, eye pain, wheezing, leg swelling, recent travel/surgery/immobilization, history of DVT/PE, diaphoresis,  lightheadedness, ongoing chest pain or shortness of breath, diarrhea, constipation, melena, hematochezia, hematemesis, obstipation, dysuria, hematuria, vaginal discharge, numbness, tingling, or weakness. She is currently on her menstrual cycle. Positive sick contacts at home. She is on OCPs  Patient is a 19 y.o. female presenting with chest pain and abdominal pain. The history is provided by the patient. No language interpreter was used.  Chest Pain Associated symptoms: abdominal pain (LUQ), cough, nausea, shortness of breath (now resolved, only with coughing) and vomiting   Associated symptoms: no diaphoresis, no dysphagia, no fever (resolved last week), no numbness and no weakness   Abdominal Pain Pain location:  LUQ Pain quality: sharp   Pain radiates to:  RUQ and RLQ Pain severity:  Moderate Onset quality:  Gradual Duration:  1 day Timing:  Intermittent Progression:  Unchanged Chronicity:  New Context: recent illness and sick contacts   Context: not recent travel and not suspicious food intake   Relieved by:  Nothing Worsened by:  Movement and coughing Ineffective treatments:  Acetaminophen Associated symptoms: chest pain (only with coughing, now resolved), chills, cough, nausea, shortness of breath (now resolved, only with coughing), sore throat and vomiting   Associated symptoms: no constipation, no diarrhea, no dysuria, no fever (resolved last week), no flatus, no hematemesis, no hematochezia, no hematuria, no melena and no vaginal discharge  Vaginal bleeding: on menses.     Past Medical History  Diagnosis Date  . Anxiety   . Depression   . Oppositional defiant disorder   . ADD (attention deficit disorder)   . Kidney infection   . Headache(784.0)     had  migraines when she was younger  . Anemia   . Ectopic pregnancy    Past Surgical History  Procedure Laterality Date  . Tubes in ears      in the past  . Mass excision Left 07/04/2013    Procedure: EXCISION MASS;   Surgeon: Newt Minion, MD;  Location: [facility];  Service: Orthopedics;  Laterality: Left;  Excision Left Foot Talo-calcaneous fibrous bar  . Ankle surgery    . Wisdom tooth extraction     Family History  Problem Relation Age of Onset  . Depression Mother   . Anxiety disorder Mother    Social History  Substance Use Topics  . Smoking status: Never Smoker   . Smokeless tobacco: Never Used  . Alcohol Use: Yes     Comment: 1 drink a month   OB History    Gravida Para Term Preterm AB TAB SAB Ectopic Multiple Living   1              Review of Systems  Constitutional: Positive for chills. Negative for fever (resolved last week) and diaphoresis.  HENT: Positive for sore throat. Negative for drooling, ear discharge, ear pain, rhinorrhea and trouble swallowing.   Eyes: Negative for pain, discharge and itching.  Respiratory: Positive for cough and shortness of breath (now resolved, only with coughing). Negative for wheezing.   Cardiovascular: Positive for chest pain (only with coughing, now resolved). Negative for leg swelling.  Gastrointestinal: Positive for nausea, vomiting and abdominal pain (LUQ). Negative for diarrhea, constipation, blood in stool, melena, hematochezia, flatus and hematemesis.  Genitourinary: Negative for dysuria, hematuria and vaginal discharge. Vaginal bleeding: on menses.  Musculoskeletal: Negative for myalgias and arthralgias.  Skin: Negative for color change.  Allergic/Immunologic: Negative for immunocompromised state.  Neurological: Negative for syncope, weakness, light-headedness and numbness.  Psychiatric/Behavioral: Negative for confusion.   10 Systems reviewed and are negative for acute change except as noted in the HPI.    Allergies  Review of patient's allergies indicates no known allergies.  Home Medications   Prior to Admission medications   Medication Sig Start Date End Date Taking? Authorizing Provider  HYDROcodone-acetaminophen (NORCO) 5-325 MG  per tablet Take 1 tablet by mouth every 6 (six) hours as needed for pain. 07/04/13   Newt Minion, MD  hydrOXYzine (ATARAX/VISTARIL) 10 MG tablet Take 10-68m po BID prn anxiety 07/25/15   SCharlcie Cradle MD  ibuprofen (ADVIL,MOTRIN) 200 MG tablet Take 400 mg by mouth every 6 (six) hours as needed for pain.     Historical Provider, MD  Norethindrone-Ethinyl Estradiol-Fe Biphas (LO LOESTRIN FE) 1 MG-10 MCG / 10 MCG tablet Take 1 tablet by mouth daily.    Historical Provider, MD  PARoxetine (PAXIL) 40 MG tablet Take 2 tablets (80 mg total) by mouth daily. 07/25/15 07/24/16  SCharlcie Cradle MD   Triage VS: BP 119/85 mmHg  Pulse 112  Temp(Src) 99.2 F (37.3 C) (Oral)  Resp 18  SpO2 97%  LMP 09/08/2015  Breastfeeding? No Exam VS: BP 113/76 mmHg  Pulse 96  Temp(Src) 99.2 F (37.3 C) (Oral)  Resp 16  SpO2 100%  LMP 09/08/2015  Breastfeeding? No  Physical Exam  Constitutional: She is oriented to person, place, and time. Vital signs are normal. She appears well-developed and well-nourished.  Non-toxic appearance. No distress.  Afebrile, nontoxic, NAD  HENT:  Head: Normocephalic and atraumatic.  Mouth/Throat: Uvula is midline. Mucous membranes are dry. No trismus in the jaw. No uvula swelling. Oropharyngeal exudate, posterior  oropharyngeal edema and posterior oropharyngeal erythema present. No tonsillar abscesses.  Oropharynx diffusely erythematous without uvular swelling or deviation, no trismus or drooling, 3+ b/l tonsillar swelling and erythema, +exudates.  No evidence of ludwig's or peritonsillar abscess. Mildly dry mucous membranes  Eyes: Conjunctivae and EOM are normal. Right eye exhibits no discharge. Left eye exhibits no discharge.  Neck: Normal range of motion. Neck supple.  Cardiovascular: Normal rate, regular rhythm, normal heart sounds and intact distal pulses.  Exam reveals no gallop and no friction rub.   No murmur heard. Initially tachycardic in triage which resolved during  exam, HR 93-96 during exam. RRR, nl s1/s2, no m/r/g, distal pulses intact, no pedal edema   Pulmonary/Chest: Effort normal and breath sounds normal. No respiratory distress. She has no decreased breath sounds. She has no wheezes. She has no rhonchi. She has no rales. She exhibits tenderness. She exhibits no crepitus, no deformity and no retraction.    CTAB in all lung fields, no w/r/r, no hypoxia or increased WOB, speaking in full sentences, SpO2 97-100% on RA  Mild R sided chest wall TTP, no crepitus or retraction, no deformities  Abdominal: Soft. Normal appearance and bowel sounds are normal. She exhibits no distension. There is splenomegaly. There is tenderness in the left upper quadrant. There is no rigidity, no rebound, no guarding, no CVA tenderness, no tenderness at McBurney's point and negative Murphy's sign.    Soft, nondistended, +BS throughout, with LUQ TTP and mild splenomegaly palpated, no r/g/r, neg murphy's, neg mcburney's, no CVA TTP   Musculoskeletal: Normal range of motion.  Lymphadenopathy:    She has cervical adenopathy.  Shotty cervical LAD bilaterally  Neurological: She is alert and oriented to person, place, and time. She has normal strength. No sensory deficit.  Skin: Skin is warm, dry and intact. No rash noted.  Psychiatric: She has a normal mood and affect.  Nursing note and vitals reviewed.   ED Course  Procedures (including critical care time) Labs Review Labs Reviewed  COMPREHENSIVE METABOLIC PANEL - Abnormal; Notable for the following:    Potassium 3.0 (*)    Chloride 98 (*)    Glucose, Bld 106 (*)    Creatinine, Ser 1.07 (*)    AST 166 (*)    ALT 60 (*)    Alkaline Phosphatase 164 (*)    All other components within normal limits  CBC - Abnormal; Notable for the following:    WBC 11.1 (*)    All other components within normal limits  URINALYSIS, ROUTINE W REFLEX MICROSCOPIC (NOT AT [facility]) - Abnormal; Notable for the following:    Color, Urine ORANGE  (*)    APPearance CLOUDY (*)    Hgb urine dipstick LARGE (*)    Bilirubin Urine SMALL (*)    Protein, ur 30 (*)    Leukocytes, UA SMALL (*)    All other components within normal limits  DIFFERENTIAL - Abnormal; Notable for the following:    Lymphs Abs 7.5 (*)    Monocytes Absolute 1.2 (*)    Basophils Absolute 0.3 (*)    All other components within normal limits  URINE MICROSCOPIC-ADD ON - Abnormal; Notable for the following:    Squamous Epithelial / LPF 6-30 (*)    Bacteria, UA MANY (*)    All other components within normal limits  D-DIMER, QUANTITATIVE (NOT AT [facility]) - Abnormal; Notable for the following:    D-Dimer, Quant 3.15 (*)    All other components within normal limits  LIPASE, BLOOD  PATHOLOGIST SMEAR REVIEW  I-STAT BETA HCG BLOOD, ED ([facility], [facility], [facility] ONLY)  I-STAT TROPOININ, ED  I-STAT CG4 LACTIC ACID, ED    Imaging Review Dg Chest 2 View  09/08/2015  CLINICAL DATA:  Chest pain on the right EXAM: CHEST - 2 VIEW COMPARISON:  None. FINDINGS: The heart size and mediastinal contours are within normal limits. Both lungs are clear. The visualized skeletal structures are unremarkable. IMPRESSION: No active disease. Electronically Signed   By: Inez Catalina M.D.   On: 09/08/2015 18:50   Ct Angio Chest Pe W/cm &/or Wo Cm  09/08/2015  CLINICAL DATA:  LEFT-sided abdominal pain, RIGHT-sided chest pain, nausea for 45 minutes. Diagnosed with mononucleosis yesterday. EXAM: CT ANGIOGRAPHY CHEST WITH CONTRAST TECHNIQUE: Multidetector CT imaging of the chest was performed using the standard protocol during bolus administration of intravenous contrast. Multiplanar CT image reconstructions and MIPs were obtained to evaluate the vascular anatomy. CONTRAST:  168m OMNIPAQUE IOHEXOL 350 MG/ML SOLN COMPARISON:  Chest radiograph September 08, 2015 at [facility] FINDINGS: PULMONARY ARTERY: Adequate contrast opacification of the pulmonary artery's. Main pulmonary artery is not enlarged. No pulmonary  arterial filling defects to the level of the subsegmental branches. MEDIASTINUM: Heart and pericardium are unremarkable, no right heart strain. Thoracic aorta is normal course and caliber, unremarkable. Small amount of residual thymic tissue. Prominent though not pathologically enlarged mediastinal and hilar lymph nodes. LUNGS: Tracheobronchial tree is patent, no pneumothorax. No pleural effusions, focal consolidations, masses. 3 mm ground-glass nodule along major fissure. Bandlike density LEFT lower lobe. 4 mm LEFT lower lobe sub solid subpleural nodules likely benign. SOFT TISSUES AND OSSEOUS STRUCTURES: Included view of the abdomen is unremarkable. Visualized soft tissues and included osseous structures appear normal. Review of the MIP images confirms the above findings. IMPRESSION: No acute pulmonary embolism nor acute cardiopulmonary process. Mild LEFT lower lobe atelectasis. Electronically Signed   By: CElon AlasM.D.   On: 09/08/2015 23:58   UKoreaAbdomen Complete  09/08/2015  CLINICAL DATA:  Elevated liver function tests. Positive mono. Left upper quadrant pain starting today. EXAM: ULTRASOUND ABDOMEN COMPLETE COMPARISON:  CT abdomen and pelvis 10/02/2012 FINDINGS: Gallbladder: No gallstones or wall thickening visualized. No sonographic Murphy sign noted. Common bile duct: Diameter: 2 mm, normal Liver: No focal lesion identified. Within normal limits in parenchymal echogenicity. IVC: No abnormality visualized. Pancreas: Visualized portion unremarkable. Spleen: Spleen is enlarged, measuring 13.8 x 16 x 6.5 cm for a calculated volume of 755 mL. No focal lesions identified. Right Kidney: Length: 9.8 cm. Echogenicity within normal limits. No mass or hydronephrosis visualized. Left Kidney: Length: 10.5 cm. Echogenicity within normal limits. No mass or hydronephrosis visualized. Abdominal aorta: No aneurysm visualized. Other findings: None. IMPRESSION: Enlarged spleen. No evidence of focal spleen lesion or  laceration. Examination is otherwise unremarkable. Electronically Signed   By: WLucienne CapersM.D.   On: 09/08/2015 21:58   I have personally reviewed and evaluated these images and lab results as part of my medical decision-making.   EKG Interpretation   Date/Time:  Sunday September 08 2015 23:12:02 EST Ventricular Rate:  76 PR Interval:  161 QRS Duration: 90 QT Interval:  401 QTC Calculation: 451 R Axis:   61 Text Interpretation:  Sinus rhythm RSR' in V1 or V2, probably normal  variant Baseline wander in lead(s) V1 V2 No significant change since last  tracing Confirmed by LITTLE MD, RACHEL ([phone] on 09/09/2015 12:23:22 AM    Initial EKG: Date/Time:  Sunday September 08 2015 18:35:56 EST Ventricular Rate:  110 PR Interval:  143 QRS Duration: 85 QT Interval:  307 QTC Calculation: 415 R Axis:   77 Text Interpretation:  Sinus tachycardia Borderline repolarization  abnormality T wave inversions in leads III, aVF, V3, no previous available  for comparison Confirmed by LITTLE MD, RACHEL [phone]) on 09/08/2015  10:58:00 PM  MDM   Final diagnoses:  Infectious mononucleosis without complication, infectious mononucleosis due to unspecified organism  Splenomegaly  Elevated LFTs  Non-intractable vomiting with nausea, vomiting of unspecified type  LUQ abdominal pain  Tonsillitis  Chest wall pain  SOB (shortness of breath) on exertion  Infectious lymphocytosis  Hypokalemia  Elevated d-dimer    19 y.o. female here with multiple complaints. Primary complaint is LUQ abd pain intermittently that began today, nausea that has been ongoing for several days now with some vomiting despite zofran at home, and then developed CP and SOB while coughing earlier today which has subsided upon evaluation. CP is reproducible on exam, no tachycardia on my exam although initial triage VS show mildly tachycardia, no hypoxia and given no ongoing CP/SOB doubt PE. Also complains of sore throat, went to  urgent care yesterday and had +mono test with neg RST. Throat with 3+ tonsils and exudates/erythema, no evidence of peritonsillar abscess or ludwigs but tonsils very large. Will give decadron to help with swelling. Abdominal exam reveals mild splenomegaly with LUQ tenderness, consistent with having mono. EKG with sinus tachy and few T-wave inversions without priors to compare but no other ischemic changes. Trop neg. BetaHCG neg. Other labs pending. CXR unremarkable. Awaiting U/A. Will give pain meds, nausea meds, fluids, and decadron then reassess shortly.   8:53 PM Temp now noted to be 100.2 therefore will give tylenol and obtain lactic acid level. Lipase WNL, CMP showing K 3.0 therefore will replete, also with Cr 1.07 with fluids now running. Also shows AST 166 and ALT 60 with alk phos 164, will obtain U/S to eval. CBC with WBC 11.1, lymphocytic predominance which is consistent with mono infection. Awaiting U/A. Will reassess shortly.   10:20 PM U/A with 6-30 squamous, small leuks, 6-30 WBC and many bacteria but likely contaminated, no urinary symptoms and no suprapubic pain, doubt UTI. Lactic acid WNL. U/S showing splenomegaly without any other findings. Dr. Rex Kras saw pt and felt we should get dimer to attempt to r/o PE given that pt stated that her CP initially was worse with inspiration and she is on OCPs, and she was tachycardic once here. Awaiting D-dimer. Will reassess shortly.  10:47 PM Dimer 3.15, will proceed with CT scan of chest to r/o PE. Pt feels much better, nausea completely resolved and has been taking PO here without difficulty, pain improved. Sore throat and pain with swallowing better after decadron, tonsils slightly less enlarged. Will continue giving fluids since pt would likely benefit from this given her illness. Of note, temp down to 98.1 after tylenol, HR continues to go down after fluids and now in the 80s. Pt with no concerns at this time, will await CT chest and reassess  shortly. Will also repeat EKG now that pt is not tachycardic  12:20 AM Repeat EKG with some Twave inversions still but less than prior EKG, no longer with tachycardia. Chest CT neg for PE, CP likely musculoskeletal. Pt continues to feel improved and ready to go home. Tolerating PO well here. Does not have a PCP, discussed calling her insurance carrier in order to find a dr in her area,  and then f/up with them in 1wk for recheck. Discussed OTC meds for symptomatic control, in addition to norco/naprosyn. Discussed avoidance of excessive tylenol use, no more than 3g per day. Phenergan as needed for nausea, since home zofran didn't help. Discussed importance of no contact sports or trauma to LUQ. Good hydration and chloraseptic spray discussed. I explained the diagnosis and have given explicit precautions to return to the ER including for any other new or worsening symptoms. The patient understands and accepts the medical plan as it's been dictated and I have answered their questions. Discharge instructions concerning home care and prescriptions have been given. The patient is STABLE and is discharged to home in good condition.   BP 110/72 mmHg  Pulse 86  Temp(Src) 98.1 F (36.7 C) (Oral)  Resp 16  SpO2 98%  LMP 09/08/2015  Breastfeeding? No  Meds ordered this encounter  Medications  . sodium chloride 0.9 % bolus 1,000 mL    Sig:   . dexamethasone (DECADRON) injection 10 mg    Sig:   . ondansetron (ZOFRAN) injection 4 mg    Sig:   . morphine 2 MG/ML injection 2 mg    Sig:   . acetaminophen (TYLENOL) tablet 650 mg    Sig:   . potassium chloride SA (K-DUR,KLOR-CON) CR tablet 60 mEq    Sig:   . sodium chloride 0.9 % bolus 1,000 mL    Sig:   . iohexol (OMNIPAQUE) 350 MG/ML injection 100 mL    Sig:   . HYDROcodone-acetaminophen (NORCO) 5-325 MG tablet    Sig: Take 1 tablet by mouth every 6 (six) hours as needed for severe pain.    Dispense:  10 tablet    Refill:  0    Order Specific  Question:  Supervising Provider    Answer:  Sabra Heck, BRIAN [[id number]]  . naproxen (NAPROSYN) 500 MG tablet    Sig: Take 1 tablet (500 mg total) by mouth 2 (two) times daily as needed for mild pain, moderate pain or headache (TAKE WITH MEALS.).    Dispense:  20 tablet    Refill:  0    Order Specific Question:  Supervising Provider    Answer:  MILLER, BRIAN [[id number]]  . promethazine (PHENERGAN) 25 MG tablet    Sig: Take 1 tablet (25 mg total) by mouth every 6 (six) hours as needed for nausea or vomiting.    Dispense:  20 tablet    Refill:  0    Order Specific Question:  Supervising Provider    Answer:  Noemi Chapel [[id number]]      Camprubi-Soms, PA-C 09/09/15 0024  Sharlett Iles, MD 09/09/15 0111

## 2015-09-08 NOTE — ED Notes (Signed)
Patient c/o left sided abdominal pain, right sided chest pain, and nausea onset 45 minutes ago. Diagnosed with Mononucleosis yesterday.

## 2015-09-09 LAB — PATHOLOGIST SMEAR REVIEW

## 2015-09-09 MED ORDER — PROMETHAZINE HCL 25 MG PO TABS: 25.0000 mg | ORAL_TABLET | Freq: Four times a day (QID) | ORAL | Status: DC | PRN

## 2015-09-09 MED ORDER — HYDROCODONE-ACETAMINOPHEN 5-325 MG PO TABS: 1.0000 | ORAL_TABLET | Freq: Four times a day (QID) | ORAL | Status: DC | PRN

## 2015-09-09 MED ORDER — NAPROXEN 500 MG PO TABS: 500.0000 mg | ORAL_TABLET | Freq: Two times a day (BID) | ORAL | Status: DC | PRN

## 2015-09-09 NOTE — Discharge Instructions (Signed)
Continue to stay well-hydrated. Gargle warm salt water and spit it out. Use over the counter chloraseptic spray as needed for sore throat. Continue to alternate between naprosyn and norco pain or fever, using additional tylenol sparingly as needed for fevers but don't exceed 3,063m of tylenol per day. Don't drive while taking norco. Use Mucinex for cough suppression/expectoration of mucus. Use netipot and flonase to help with nasal congestion. May consider over-the-counter Benadryl or other antihistamine to decrease secretions and for watery itchy eyes. Use phenergan as directed as needed for nausea. Do NOT engage in contact sports until cleared by your doctor. Followup with a primary care doctor from your insurance carrier in 5-7 days for recheck of ongoing symptoms. Return to emergency department for emergent changing or worsening of symptoms.    Enlarged Spleen The spleen is an organ located in the upper abdomen under your left ribs. It is a spongelike organ, about the size of an orange, which acts as a filter. The spleen is part of the lymph system and filters the blood. It removes old blood cells and abnormal blood cells. It is also part of the immune response and helps fight infections. An enlarged spleen (splenomegaly) is usually noticed when it is almost twice its normal size. CAUSES  There are many possible causes of an enlarged spleen. These causes include:  Infections (viral, bacterial, or parasitic).  Liver cirrhosis and other liver diseases.  Hemolytic anemia (types of anemia that lower your red blood cell count) and other blood diseases.  Hypersplenism (reduction in many types of blood cells by an enlarged spleen).  Blood cancers (leukemia, Hodgkin's disease).  Metabolic disorders (Gaucher's disease, Niemann-Pick disease).  Tumors and cysts.  Pressure or blood clots in the veins of the spleen.  Connective tissue disorders (lupus, rheumatoid arthritis with Felty's  syndrome). SYMPTOMS  An enlarged spleen may not always cause symptoms. If symptoms do occur, they may include:  Pain in the upper left abdomen (pain may spread to the left shoulder or get worse when you take a breath).  Feeling full without eating or eating only a small amount.  Feeling tired.  Chronic infections.  Bleeding easily. DIAGNOSIS  Tests may include:  Physical examination of the left upper abdomen.  Blood tests to check red and white blood cells and other proteins and enzymes.  Imaging tests, such as abdominal ultrasonography, computerized X-ray scan (computed tomography, CT), and computerized magnetic scan (magnetic resonance imaging, MRI).  Taking a tissue sample (biopsy) of the liver to examine it.  Examining a bone marrow biopsy sample. TREATMENT  Treatment varies depending on the cause of the enlarged spleen. Treatment aims to manage the conditions that cause swelling of the spleen and reduce the size of the spleen. Treatment may include:  Medications to eliminate infection or treat disease.  Radiation therapy.  Blood transfusions.  Vaccinations. If these treatments are not successful, or the cause cannot be determined, surgery to remove the spleen (splenectomy) may be recommended. HOME CARE INSTRUCTIONS   Take all medications as directed.  Take all antibiotics, even if you start to feel better. Discuss with your caregiver the use of a probiotic supplement to prevent stomach upset.  To avoid injury or a ruptured spleen:  Limit activities as directed.  Avoid contact sports.  Wear your seat belt in the car.  See your caregiver for vaccinations, follow up examinations and testing as directed.  Follow all of your caregiver's instructions on managing the conditions that cause your enlarged spleen. PREVENTION  It is not always possible to prevent an enlarged spleen. Reduce your chances of developing an enlarged spleen:  Practice good hygiene to  prevent infection.  Get recommended vaccines to prevent infection. SEEK MEDICAL CARE IF:   You develop a fever (more than 100.42F [38.1 C]) or other signs of infection (chills, feeling unwell).  You experience injury or impact to the spleen area.  Your symptoms do not go away as you and your doctor expected.  You experience increased pain when you take in a breath.  Your symptoms worsen, or you develop new symptoms. MAKE SURE YOU:   Understand these instructions.  Will watch your condition.  Will get help right away if you are not doing well or get worse. Follow up with your caregiver to find out the results of your tests. Not all test results may be available during your visit. If your test results are not back during the visit, make an appointment with your caregiver to find out the results. Do not assume everything is normal if you have not heard from your caregiver or the medical facility. It is important for you to follow up on all of your test results.    This information is not intended to replace advice given to you by your health care provider. Make sure you discuss any questions you have with your health care provider.   Document Released: 03/04/2010 Document Revised: 10/05/2014 Document Reviewed: 03/04/2015 Elsevier Interactive Patient Education 2016 [employer]  Infectious Mononucleosis Infectious mononucleosis is an infection caused by a virus. This illness is often called "mono." It causes symptoms that affect various areas of the body, including the throat, upper air passages, and lymph glands. The liver or spleen may also be affected. The virus spreads from person to person through close contact. The illness is usually not serious and often goes away in 2-4 weeks without treatment. In rare cases, symptoms can be more severe and last longer, sometimes up to several months. Because the illness can sometimes cause the liver or spleen to become enlarged, you should not  participate in contact sports or strenuous exercise until your health care provider approves. CAUSES  Infectious mononucleosis is caused by the Epstein-Barr virus. This virus spreads through contact with an infected person's saliva or other bodily fluids. It is often spread through kissing. It may also spread through coughing or sharing utensils or drinking glasses that were recently used by an infected person. An infected person will not always appear ill but can still spread the virus. RISK FACTORS This illness is most common in adolescents and young adults. SIGNS AND SYMPTOMS  The most common symptoms of infectious mononucleosis are:  Sore throat.   Headache.   Fatigue.   Muscle aches.   Swollen glands.   Fever.   Poor appetite.   Enlarged liver or spleen.  Some less common symptoms that can also occur include:  Rash. This is more common if you take antibiotic medicines.  Feeling sick to your stomach (nauseous).   Abdominal pain.  DIAGNOSIS  Your health care provider will take your medical history and do a physical exam. Blood tests can be done to confirm the diagnosis.  TREATMENT  Infectious mononucleosis usually goes away on its own with time. It cannot be cured with medicines, but medicines are sometimes used to relieve symptoms. Steroid medicine is sometimes needed if the swelling in the throat causes breathing or swallowing problems. Treatment in a hospital is sometimes needed for severe cases.  HOME  CARE INSTRUCTIONS   Rest as needed.   Do not participate in contact sports, strenuous exercise, or heavy lifting until your health care provider approves. The liver and spleen could be seriously injured if they are enlarged from the illness. You may need to wait a couple months before participating in sports.   Drink enough fluid to keep your urine clear or pale yellow.   Do not drink alcohol.  Take medicines only as directed by your health care provider.  Children under 73 years of age should not take aspirin because of the association with Reye syndrome.   Eat soft foods. Cold foods such as ice cream or frozen ice pops can soothe a sore throat.  If you have a sore throat, gargle with a mixture of salt and water. This may help relieve your discomfort. Mix 1 tsp of salt in 1 cup of warm water. Sucking on hard candy may also help.   Start regular activities gradually after the fever is gone. Be sure to rest when tired.   Avoid kissing or sharing utensils or drinking glasses until your health care provider tells you that you are no longer contagious.  PREVENTION  To avoid spreading the virus, do not kiss anyone or share utensils, drinking glasses, or food until your health care provider tells you that you are no longer contagious. SEEK MEDICAL CARE IF:   Your fever is not gone after 10 days.  You have swollen lymph nodes that are not back to normal after 4 weeks.  Your activity level is not back to normal after 2 months.   You have yellow coloring to your eyes and skin (jaundice).  You have constipation.  SEEK IMMEDIATE MEDICAL CARE IF:   You have severe pain in the abdomen or shoulder.  You are drooling.  You have trouble swallowing.  You have trouble breathing.  You develop a stiff neck.  You develop a severe headache.  You cannot stop throwing up (vomiting).  You have convulsions.  You are confused.  You have trouble with balance.  You have signs of dehydration. These may include:  Weakness.  Sunken eyes.  Pale skin.  Dry mouth.  Rapid breathing or pulse.   This information is not intended to replace advice given to you by your health care provider. Make sure you discuss any questions you have with your health care provider.   Document Released: 09/11/2000 Document Revised: 10/05/2014 Document Reviewed: 05/22/2014 Elsevier Interactive Patient Education 2016 [employer].  Hypokalemia Hypokalemia  means that the amount of potassium in the blood is lower than normal.Potassium is a chemical, called an electrolyte, that helps regulate the amount of fluid in the body. It also stimulates muscle contraction and helps nerves function properly.Most of the body's potassium is inside of cells, and only a very small amount is in the blood. Because the amount in the blood is so small, minor changes can be life-threatening. CAUSES  Antibiotics.  Diarrhea or vomiting.  Using laxatives too much, which can cause diarrhea.  Chronic kidney disease.  Water pills (diuretics).  Eating disorders (bulimia).  Low magnesium level.  Sweating a lot. SIGNS AND SYMPTOMS  Weakness.  Constipation.  Fatigue.  Muscle cramps.  Mental confusion.  Skipped heartbeats or irregular heartbeat (palpitations).  Tingling or numbness. DIAGNOSIS  Your health care provider can diagnose hypokalemia with blood tests. In addition to checking your potassium level, your health care provider may also check other lab tests. TREATMENT Hypokalemia can be treated with potassium  supplements taken by mouth or adjustments in your current medicines. If your potassium level is very low, you may need to get potassium through a vein (IV) and be monitored in the hospital. A diet high in potassium is also helpful. Foods high in potassium are:  Nuts, such as peanuts and pistachios.  Seeds, such as sunflower seeds and pumpkin seeds.  Peas, lentils, and lima beans.  Whole grain and bran cereals and breads.  Fresh fruit and vegetables, such as apricots, avocado, bananas, cantaloupe, kiwi, oranges, tomatoes, asparagus, and potatoes.  Orange and tomato juices.  Red meats.  Fruit yogurt. HOME CARE INSTRUCTIONS  Take all medicines as prescribed by your health care provider.  Maintain a healthy diet by including nutritious food, such as fruits, vegetables, nuts, whole grains, and lean meats.  If you are taking a laxative,  be sure to follow the directions on the label. SEEK MEDICAL CARE IF:  Your weakness gets worse.  You feel your heart pounding or racing.  You are vomiting or having diarrhea.  You are diabetic and having trouble keeping your blood glucose in the normal range. SEEK IMMEDIATE MEDICAL CARE IF:  You have chest pain, shortness of breath, or dizziness.  You are vomiting or having diarrhea for more than 2 days.  You faint. MAKE SURE YOU:   Understand these instructions.  Will watch your condition.  Will get help right away if you are not doing well or get worse.   This information is not intended to replace advice given to you by your health care provider. Make sure you discuss any questions you have with your health care provider.   Document Released: 09/14/2005 Document Revised: 10/05/2014 Document Reviewed: 03/17/2013 Elsevier Interactive Patient Education 2016 Elsevier Inc.  Nausea and Vomiting Nausea means you feel sick to your stomach. Throwing up (vomiting) is a reflex where stomach contents come out of your mouth. HOME CARE   Take medicine as told by your doctor.  Do not force yourself to eat. However, you do need to drink fluids.  If you feel like eating, eat a normal diet as told by your doctor.  Eat rice, wheat, potatoes, bread, lean meats, yogurt, fruits, and vegetables.  Avoid high-fat foods.  Drink enough fluids to keep your pee (urine) clear or pale yellow.  Ask your doctor how to replace body fluid losses (rehydrate). Signs of body fluid loss (dehydration) include:  Feeling very thirsty.  Dry lips and mouth.  Feeling dizzy.  Dark pee.  Peeing less than normal.  Feeling confused.  Fast breathing or heart rate. GET HELP RIGHT AWAY IF:   You have blood in your throw up.  You have black or bloody poop (stool).  You have a bad headache or stiff neck.  You feel confused.  You have bad belly (abdominal) pain.  You have chest pain or trouble  breathing.  You do not pee at least once every 8 hours.  You have cold, clammy skin.  You keep throwing up after 24 to 48 hours.  You have a fever. MAKE SURE YOU:   Understand these instructions.  Will watch your condition.  Will get help right away if you are not doing well or get worse.   This information is not intended to replace advice given to you by your health care provider. Make sure you discuss any questions you have with your health care provider.   Document Released: 03/02/2008 Document Revised: 12/07/2011 Document Reviewed: 02/13/2011 Elsevier Interactive Patient Education 2016  [place].  Potassium Content of Foods Potassium is a mineral found in many foods and drinks. It helps keep fluids and minerals balanced in your body and affects how steadily your heart beats. Potassium also helps control your blood pressure and keep your muscles and nervous system healthy. Certain health conditions and medicines may change the balance of potassium in your body. When this happens, you can help balance your level of potassium through the foods that you do or do not eat. Your health care provider or dietitian may recommend an amount of potassium that you should have each day. The following lists of foods provide the amount of potassium (in parentheses) per serving in each item. HIGH IN POTASSIUM  The following foods and beverages have 200 mg or more of potassium per serving:  Apricots, 2 raw or 5 dry (200 mg).  Artichoke, 1 medium (345 mg).  Avocado, raw,  each (245 mg).  Banana, 1 medium (425 mg).  Beans, lima, or baked beans, canned,  cup (280 mg).  Beans, white, canned,  cup (595 mg).  Beef roast, 3 oz (320 mg).  Beef, ground, 3 oz (270 mg).  Beets, raw or cooked,  cup (260 mg).  Bran muffin, 2 oz (300 mg).  Broccoli,  cup (230 mg).  Brussels sprouts,  cup (250 mg).  Cantaloupe,  cup (215 mg).  Cereal, 100% bran,  cup (200-400  mg).  Cheeseburger, single, fast food, 1 each (225-400 mg).  Chicken, 3 oz (220 mg).  Clams, canned, 3 oz (535 mg).  Crab, 3 oz (225 mg).  Dates, 5 each (270 mg).  Dried beans and peas,  cup (300-475 mg).  Figs, dried, 2 each (260 mg).  Fish: halibut, tuna, cod, snapper, 3 oz (480 mg).  Fish: salmon, haddock, swordfish, perch, 3 oz (300 mg).  Fish, tuna, canned 3 oz (200 mg).  [place] fries, fast food, 3 oz (470 mg).  Granola with fruit and nuts,  cup (200 mg).  Grapefruit juice,  cup (200 mg).  Greens, beet,  cup (655 mg).  Honeydew melon,  cup (200 mg).  Kale, raw, 1 cup (300 mg).  Kiwi, 1 medium (240 mg).  Kohlrabi, rutabaga, parsnips,  cup (280 mg).  Lentils,  cup (365 mg).  Mango, 1 each (325 mg).  Milk, chocolate, 1 cup (420 mg).  Milk: nonfat, low-fat, whole, buttermilk, 1 cup (350-380 mg).  Molasses, 1 Tbsp (295 mg).  Mushrooms,  cup (280) mg.  Nectarine, 1 each (275 mg).  Nuts: almonds, peanuts, hazelnuts, [place], cashew, mixed, 1 oz (200 mg).  Nuts, pistachios, 1 oz (295 mg).  Orange, 1 each (240 mg).  Orange juice,  cup (235 mg).  Papaya, medium,  fruit (390 mg).  Peanut butter, chunky, 2 Tbsp (240 mg).  Peanut butter, smooth, 2 Tbsp (210 mg).  Pear, 1 medium (200 mg).  Pomegranate, 1 whole (400 mg).  Pomegranate juice,  cup (215 mg).  Pork, 3 oz (350 mg).  Potato chips, salted, 1 oz (465 mg).  Potato, baked with skin, 1 medium (925 mg).  Potatoes, boiled,  cup (255 mg).  Potatoes, mashed,  cup (330 mg).  Prune juice,  cup (370 mg).  Prunes, 5 each (305 mg).  Pudding, chocolate,  cup (230 mg).  Pumpkin, canned,  cup (250 mg).  Raisins, seedless,  cup (270 mg).  Seeds, sunflower or pumpkin, 1 oz (240 mg).  Soy milk, 1 cup (300 mg).  Spinach,  cup (420 mg).  Spinach, canned,  cup (  370 mg).  Sweet potato, baked with skin, 1 medium (450 mg).  Swiss chard,  cup (480 mg).  Tomato or  vegetable juice,  cup (275 mg).  Tomato sauce or puree,  cup (400-550 mg).  Tomato, raw, 1 medium (290 mg).  Tomatoes, canned,  cup (200-300 mg).  [place], 3 oz (250 mg).  Wheat germ, 1 oz (250 mg).  Winter squash,  cup (250 mg).  Yogurt, plain or fruited, 6 oz (260-435 mg).  Zucchini,  cup (220 mg). MODERATE IN POTASSIUM The following foods and beverages have 50-200 mg of potassium per serving:  Apple, 1 each (150 mg).  Apple juice,  cup (150 mg).  Applesauce,  cup (90 mg).  Apricot nectar,  cup (140 mg).  Asparagus, small spears,  cup or 6 spears (155 mg).  Bagel, cinnamon raisin, 1 each (130 mg).  Bagel, egg or plain, 4 in., 1 each (70 mg).  Beans, green,  cup (90 mg).  Beans, yellow,  cup (190 mg).  Beer, regular, 12 oz (100 mg).  Beets, canned,  cup (125 mg).  Blackberries,  cup (115 mg).  Blueberries,  cup (60 mg).  Bread, whole wheat, 1 slice (70 mg).  Broccoli, raw,  cup (145 mg).  Cabbage,  cup (150 mg).  Carrots, cooked or raw,  cup (180 mg).  Cauliflower, raw,  cup (150 mg).  Celery, raw,  cup (155 mg).  Cereal, bran flakes, cup (120-150 mg).  Cheese, cottage,  cup (110 mg).  Cherries, 10 each (150 mg).  Chocolate, 1 oz bar (165 mg).  Coffee, brewed 6 oz (90 mg).  Corn,  cup or 1 ear (195 mg).  Cucumbers,  cup (80 mg).  Egg, large, 1 each (60 mg).  Eggplant,  cup (60 mg).  Endive, raw, cup (80 mg).  English muffin, 1 each (65 mg).  Fish, orange roughy, 3 oz (150 mg).  Frankfurter, beef or pork, 1 each (75 mg).  Fruit cocktail,  cup (115 mg).  Grape juice,  cup (170 mg).  Grapefruit,  fruit (175 mg).  Grapes,  cup (155 mg).  Greens: kale, turnip, collard,  cup (110-150 mg).  Ice cream or frozen yogurt, chocolate,  cup (175 mg).  Ice cream or frozen yogurt, vanilla,  cup (120-150 mg).  Lemons, limes, 1 each (80 mg).  Lettuce, all types, 1 cup (100 mg).  Mixed vegetables,   cup (150 mg).  Mushrooms, raw,  cup (110 mg).  Nuts: walnuts, pecans, or macadamia, 1 oz (125 mg).  Oatmeal,  cup (80 mg).  Okra,  cup (110 mg).  Onions, raw,  cup (120 mg).  Peach, 1 each (185 mg).  Peaches, canned,  cup (120 mg).  Pears, canned,  cup (120 mg).  Peas, green, frozen,  cup (90 mg).  Peppers, green,  cup (130 mg).  Peppers, red,  cup (160 mg).  Pineapple juice,  cup (165 mg).  Pineapple, fresh or canned,  cup (100 mg).  Plums, 1 each (105 mg).  Pudding, vanilla,  cup (150 mg).  Raspberries,  cup (90 mg).  Rhubarb,  cup (115 mg).  Rice, wild,  cup (80 mg).  Shrimp, 3 oz (155 mg).  Spinach, raw, 1 cup (170 mg).  Strawberries,  cup (125 mg).  Summer squash  cup (175-200 mg).  Swiss chard, raw, 1 cup (135 mg).  Tangerines, 1 each (140 mg).  Tea, brewed, 6 oz (65 mg).  Turnips,  cup (140 mg).  Watermelon,  cup (85 mg).  Wine, red, table, 5 oz (180 mg).  Wine, white, table, 5 oz (100 mg). LOW IN POTASSIUM The following foods and beverages have less than 50 mg of potassium per serving.  Bread, white, 1 slice (30 mg).  Carbonated beverages, 12 oz (less than 5 mg).  Cheese, 1 oz (20-30 mg).  Cranberries,  cup (45 mg).  Cranberry juice cocktail,  cup (20 mg).  Fats and oils, 1 Tbsp (less than 5 mg).  Hummus, 1 Tbsp (32 mg).  Nectar: papaya, mango, or pear,  cup (35 mg).  Rice, white or brown,  cup (50 mg).  Spaghetti or macaroni,  cup cooked (30 mg).  Tortilla, flour or corn, 1 each (50 mg).  Waffle, 4 in., 1 each (50 mg).  Water chestnuts,  cup (40 mg).   This information is not intended to replace advice given to you by your health care provider. Make sure you discuss any questions you have with your health care provider.   Document Released: 04/28/2005 Document Revised: 09/19/2013 Document Reviewed: 08/11/2013 Elsevier Interactive Patient Education 2016 [employer]  Inc[employer]   Tonsillitis Tonsillitis is an infection of the throat that causes the tonsils to become red, tender, and swollen. Tonsils are collections of lymphoid tissue at the back of the throat. Each tonsil has crevices (crypts). Tonsils help fight nose and throat infections and keep infection from spreading to other parts of the body for the first 18 months of life.  CAUSES Sudden (acute) tonsillitis is usually caused by infection with streptococcal bacteria. Long-lasting (chronic) tonsillitis occurs when the crypts of the tonsils become filled with pieces of food and bacteria, which makes it easy for the tonsils to become repeatedly infected. SYMPTOMS  Symptoms of tonsillitis include:  A sore throat, with possible difficulty swallowing.  White patches on the tonsils.  Fever.  Tiredness.  New episodes of snoring during sleep, when you did not snore before.  Small, foul-smelling, yellowish-white pieces of material (tonsilloliths) that you occasionally cough up or spit out. The tonsilloliths can also cause you to have bad breath. DIAGNOSIS Tonsillitis can be diagnosed through a physical exam. Diagnosis can be confirmed with the results of lab tests, including a throat culture. TREATMENT  The goals of tonsillitis treatment include the reduction of the severity and duration of symptoms and prevention of associated conditions. Symptoms of tonsillitis can be improved with the use of steroids to reduce the swelling. Tonsillitis caused by bacteria can be treated with antibiotic medicines. Usually, treatment with antibiotic medicines is started before the cause of the tonsillitis is known. However, if it is determined that the cause is not bacterial, antibiotic medicines will not treat the tonsillitis. If attacks of tonsillitis are severe and frequent, your health care provider may recommend surgery to remove the tonsils (tonsillectomy). HOME CARE INSTRUCTIONS   Rest as much as possible and get plenty  of sleep.  Drink plenty of fluids. While the throat is very sore, eat soft foods or liquids, such as sherbet, soups, or instant breakfast drinks.  Eat frozen ice pops.  Gargle with a warm or cold liquid to help soothe the throat. Mix 1/4 teaspoon of salt and 1/4 teaspoon of baking soda in 8 oz of water. SEEK MEDICAL CARE IF:   Large, tender lumps develop in your neck.  A rash develops.  A green, yellow-brown, or bloody substance is coughed up.  You are unable to swallow liquids or food for 24 hours.  You notice that only one of the tonsils is swollen.  SEEK IMMEDIATE MEDICAL CARE IF:   You develop any new symptoms such as vomiting, severe headache, stiff neck, chest pain, or trouble breathing or swallowing.  You have severe throat pain along with drooling or voice changes.  You have severe pain, unrelieved with recommended medications.  You are unable to fully open the mouth.  You develop redness, swelling, or severe pain anywhere in the neck.  You have a fever. MAKE SURE YOU:   Understand these instructions.  Will watch your condition.  Will get help right away if you are not doing well or get worse.   This information is not intended to replace advice given to you by your health care provider. Make sure you discuss any questions you have with your health care provider.   Document Released: 06/24/2005 Document Revised: 10/05/2014 Document Reviewed: 03/03/2013 Elsevier Interactive Patient Education 2016 [employer] Abdominal Pain, Adult Many things can cause belly (abdominal) pain. Most times, the belly pain is not dangerous. Many cases of belly pain can be watched and treated at home. HOME CARE   Do not take medicines that help you go poop (laxatives) unless told to by your doctor.  Only take medicine as told by your doctor.  Eat or drink as told by your doctor. Your doctor will tell you if you should be on a special diet. GET HELP IF:  You do not know what is  causing your belly pain.  You have belly pain while you are sick to your stomach (nauseous) or have runny poop (diarrhea).  You have pain while you pee or poop.  Your belly pain wakes you up at night.  You have belly pain that gets worse or better when you eat.  You have belly pain that gets worse when you eat fatty foods.  You have a fever. GET HELP RIGHT AWAY IF:   The pain does not go away within 2 hours.  You keep throwing up (vomiting).  The pain changes and is only in the right or left part of the belly.  You have bloody or tarry looking poop. MAKE SURE YOU:   Understand these instructions.  Will watch your condition.  Will get help right away if you are not doing well or get worse.   This information is not intended to replace advice given to you by your health care provider. Make sure you discuss any questions you have with your health care provider.   Document Released: 03/02/2008 Document Revised: 10/05/2014 Document Reviewed: 05/24/2013 Elsevier Interactive Patient Education 2016 Elsevier Inc.  Chest Wall Pain Chest wall pain is pain in or around the bones and muscles of your chest. Sometimes, an injury causes this pain. Sometimes, the cause may not be known. This pain may take several weeks or longer to get better. HOME CARE Pay attention to any changes in your symptoms. Take these actions to help with your pain:  Rest as told by your doctor.  Avoid activities that cause pain. Try not to use your chest, belly (abdominal), or side muscles to lift heavy things.  If directed, apply ice to the painful area:  Put ice in a plastic bag.  Place a towel between your skin and the bag.  Leave the ice on for 20 minutes, 2-3 times per day.  Take over-the-counter and prescription medicines only as told by your doctor.  Do not use tobacco products, including cigarettes, chewing tobacco, and e-cigarettes. If you need help quitting, ask your doctor.  Keep all  follow-up  visits as told by your doctor. This is important. GET HELP IF:  You have a fever.  Your chest pain gets worse.  You have new symptoms. GET HELP RIGHT AWAY IF:  You feel sick to your stomach (nauseous) or you throw up (vomit).  You feel sweaty or light-headed.  You have a cough with phlegm (sputum) or you cough up blood.  You are short of breath.   This information is not intended to replace advice given to you by your health care provider. Make sure you discuss any questions you have with your health care provider.   Document Released: 03/02/2008 Document Revised: 06/05/2015 Document Reviewed: 12/10/2014 Elsevier Interactive Patient Education 2016 Elsevier Inc.  Cough, Adult A cough helps to clear your throat and lungs. A cough may last only 2-3 weeks (acute), or it may last longer than 8 weeks (chronic). Many different things can cause a cough. A cough may be a sign of an illness or another medical condition. HOME CARE  Pay attention to any changes in your cough.  Take medicines only as told by your doctor.  If you were prescribed an antibiotic medicine, take it as told by your doctor. Do not stop taking it even if you start to feel better.  Talk with your doctor before you try using a cough medicine.  Drink enough fluid to keep your pee (urine) clear or pale yellow.  If the air is dry, use a cold steam vaporizer or humidifier in your home.  Stay away from things that make you cough at work or at home.  If your cough is worse at night, try using extra pillows to raise your head up higher while you sleep.  Do not smoke, and try not to be around smoke. If you need help quitting, ask your doctor.  Do not have caffeine.  Do not drink alcohol.  Rest as needed. GET HELP IF:  You have new problems (symptoms).  You cough up yellow fluid (pus).  Your cough does not get better after 2-3 weeks, or your cough gets worse.  Medicine does not help your cough and  you are not sleeping well.  You have pain that gets worse or pain that is not helped with medicine.  You have a fever.  You are losing weight and you do not know why.  You have night sweats. GET HELP RIGHT AWAY IF:  You cough up blood.  You have trouble breathing.  Your heartbeat is very fast.   This information is not intended to replace advice given to you by your health care provider. Make sure you discuss any questions you have with your health care provider.   Document Released: 05/28/2011 Document Revised: 06/05/2015 Document Reviewed: 11/21/2014 Elsevier Interactive Patient Education [employer].

## 2015-09-12 ENCOUNTER — Ambulatory Visit (HOSPITAL_COMMUNITY): Payer: Self-pay | Admitting: Psychology

## 2015-09-24 ENCOUNTER — Ambulatory Visit (INDEPENDENT_AMBULATORY_CARE_PROVIDER_SITE_OTHER): Payer: 59 | Admitting: Psychiatry

## 2015-09-24 ENCOUNTER — Encounter (HOSPITAL_COMMUNITY): Payer: Self-pay | Admitting: Psychiatry

## 2015-09-24 VITALS — BP 120/86 | HR 89 | Ht 63.5 in | Wt 129.0 lb

## 2015-09-24 DIAGNOSIS — F410 Panic disorder [episodic paroxysmal anxiety] without agoraphobia: Secondary | ICD-10-CM | POA: Diagnosis not present

## 2015-09-24 DIAGNOSIS — F331 Major depressive disorder, recurrent, moderate: Secondary | ICD-10-CM

## 2015-09-24 DIAGNOSIS — F900 Attention-deficit hyperactivity disorder, predominantly inattentive type: Secondary | ICD-10-CM

## 2015-09-24 MED ORDER — PAROXETINE HCL 40 MG PO TABS: 80.0000 mg | ORAL_TABLET | Freq: Every day | ORAL | Status: DC

## 2015-09-24 MED ORDER — HYDROXYZINE HCL 10 MG PO TABS: ORAL_TABLET | ORAL | Status: DC

## 2015-09-24 NOTE — Progress Notes (Signed)
Patient ID: Allison Bernard, female   DOB: Jan 28, 1996, 19 y.o.   MRN: [id number] Patient ID: Allison Bernard, female   DOB: 1996/06/07, 19 y.o.   MRN: [id number] [facility] MD/PA/NP OP Progress Note  09/24/2015 10:57 AM Allison Bernard  MRN:  [id number]  Subjective:   Pt had mono a few weeks ago but has now recovered.   Pt is in a new relationship. Pt might be moving to [place] and is looking for a new job. They might stay here and things are uncertain.  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   Depression has improved recently. A lot of it stems from living alone. Mood improves when she is with people.  Pt has decreased panic attacks to one every few weeks. Low motivation is improving. She is spending a lot of time in bed but is getting up more. Reports improving anhedonia but ongoing crying spells.  Denies SI/HI.  Pt is eating better and has lost some weight. Sleep is good and she is getting about 8 hrs/night.  Energy is good. Concentration is good.  Irritability is present when tired or stressed. Denies AVH. Denies grandiouse thougths and ideas of reference and compulsions.   Taking meds as prescribed and denies SE.   Chief Complaint: good Chief Complaint    Follow-up     Visit Diagnosis:   No diagnosis found.  Past Medical History:  Past Medical History  Diagnosis Date  . Anxiety   . Depression   . Oppositional defiant disorder   . ADD (attention deficit disorder)   . Kidney infection   . Headache(784.0)     had migraines when she was younger  . Anemia   . Ectopic pregnancy   . 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     Past Surgical History  Procedure Laterality Date  . Tubes in ears      in the past  . Mass excision Left 07/04/2013    Procedure: EXCISION MASS;  Surgeon: Nadara Mustard, MD;  Location: [facility] OR;  Service: Orthopedics;  Laterality: Left;  Excision Left Foot Talo-calcaneous fibrous bar  . Ankle surgery    . Wisdom tooth extraction     Family History:   Family History  Problem Relation Age of Onset  . Depression Mother   . Anxiety disorder Mother    Social History:  Social History   Social History  . Marital Status: Single    Spouse Name: N/A  . Number of Children: N/A  . Years of Education: N/A   Social History Main Topics  . Smoking status: Never Smoker   . Smokeless tobacco: Never Used  . Alcohol Use: Yes     Comment: 1 drink a month  . Drug Use: No  . Sexual Activity: Yes    Birth Control/ Protection: Pill   Other Topics Concern  . None   Social History Narrative   Additional History: She lives on her parents property. Reports that she has never smoked. She has never used smokeless tobacco. She reports that she drinks alcohol. She reports that she does not use illicit drugs.   Past Psychiatric History/Hospitalization(s): Anxiety: Yes Bipolar Disorder: No Depression: Yes Mania: No Psychosis: No Schizophrenia: No Personality Disorder: No Hospitalization for psychiatric illness: No History of Electroconvulsive Shock Therapy: No Prior Suicide Attempts: No   Musculoskeletal: Strength & Muscle Tone: within normal limits Gait & Station: normal Patient leans: N/A  Psychiatric Specialty Exam: HPI  Review of Systems  Constitutional: Negative for fever, chills and weight loss.  HENT: Negative for congestion and sore throat.   Eyes: Negative for blurred vision, double vision and redness.  Respiratory: Negative for cough, shortness of breath and wheezing.   Cardiovascular: Negative for chest pain, palpitations and leg swelling.  Gastrointestinal: Negative for heartburn, nausea, vomiting and abdominal pain.  Musculoskeletal: Negative for back pain, joint pain and neck pain.  Skin: Negative for itching and rash.  Neurological: Negative for dizziness, seizures, loss of consciousness and headaches.  Psychiatric/Behavioral: Positive for depression. Negative for suicidal ideas, hallucinations and substance abuse. The  patient is nervous/anxious. The patient does not have insomnia.     Blood pressure 120/86, pulse 89, height 5' 3.5" (1.613 m), weight 129 lb (58.514 kg), last menstrual period 09/08/2015.Body mass index is 22.49 kg/(m^2).  General Appearance: Fairly Groomed  Eye Contact:  Good  Speech:  Clear and Coherent and Normal Rate  Volume:  Normal  Mood:  Depressed  Affect:  Congruent- brighter than at previous appt  Thought Process:  Goal Directed, Linear and Logical  Orientation:  Full (Time, Place, and Person)  Thought Content:  Negative  Suicidal Thoughts:  No  Homicidal Thoughts:  No  Memory:  Immediate;   Good Recent;   Good Remote;   Good  Judgement:  Good  Insight:  Good  Psychomotor Activity:  Normal  Concentration:  Good  Recall:  Good  Fund of Knowledge: Good  Language: Good  Akathisia:  No  Handed:  Right  AIMS (if indicated):  n/a  Assets:  Communication Skills Desire for Improvement Housing Intimacy Leisure Time Physical Health Resilience Social Support Talents/Skills Transportation Vocational/Educational  ADL's:  Intact  Cognition: WNL  Sleep:  good   Is the patient at risk to self?  No. Has the patient been a risk to self in the past 6 months?  No. Has the patient been a risk to self within the distant past?  No. Is the patient a risk to others?  No. Has the patient been a risk to others in the past 6 months?  No. Has the patient been a risk to others within the distant past?  No.  Current Medications: Current Outpatient Prescriptions  Medication Sig Dispense Refill  . naproxen (NAPROSYN) 500 MG tablet Take 1 tablet (500 mg total) by mouth 2 (two) times daily as needed for mild pain, moderate pain or headache (TAKE WITH MEALS.). 20 tablet 0  . norgestrel-ethinyl estradiol (LO/OVRAL,CRYSELLE) 0.3-30 MG-MCG tablet Take 1 tablet by mouth daily.    Marland Kitchen. PARoxetine (PAXIL) 40 MG tablet Take 2 tablets (80 mg total) by mouth daily. 60 tablet 1  .  HYDROcodone-acetaminophen (NORCO) 5-325 MG per tablet Take 1 tablet by mouth every 6 (six) hours as needed for pain. (Patient not taking: Reported on 09/08/2015) 30 tablet 0  . HYDROcodone-acetaminophen (NORCO) 5-325 MG tablet Take 1 tablet by mouth every 6 (six) hours as needed for severe pain. (Patient not taking: Reported on 09/24/2015) 10 tablet 0  . hydrOXYzine (ATARAX/VISTARIL) 10 MG tablet Take 10-20mg  po BID prn anxiety (Patient not taking: Reported on 09/08/2015) 60 tablet 1  . ibuprofen (ADVIL,MOTRIN) 200 MG tablet Take 400 mg by mouth every 6 (six) hours as needed for headache. Reported on 09/24/2015    . promethazine (PHENERGAN) 25 MG tablet Take 1 tablet (25 mg total) by mouth every 6 (six) hours as needed for nausea or vomiting. (Patient not taking: Reported on 09/24/2015) 20 tablet 0  . Pseudoeph-Doxylamine-DM-APAP (NYQUIL  PO) Take 1 Dose by mouth daily. Reported on 09/24/2015     No current facility-administered medications for this visit.    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 Pt is not pregnant and is not actively trying to get pregnant at this time but is concerned about the possibility. We discussed teratogenic risks and pt Verbalized understanding and opted to continue. She will inform us if she gets pregnant and plans to stop using all MH meds at that point in time.  -Vistaril 10-20mg  po BID prn anxiety Medication management with supportive therapy. Risks/benefits and SE of the medication discussed. Pt verbalized understanding and verbal consent obtained for treatment. Affirm with  the patient that the medications are taken as ordered. Patient expressed understanding of how their medications were to be used.    Labs: reviewed labs 03/30/2015 CBC WNL  Therapy: brief supportive therapy provided. Discussed psychosocial stressors in detail.  Encouraged to continue individual therapy  Pt denies SI and is at an acute low risk for suicide.Patient told to call clinic if any problems occur. Patient advised to go to ER if they should develop SI/HI, side effects, or if symptoms worsen. Has crisis numbers to call if needed. Pt verbalized understanding.  F/up in 3 months or sooner if needed           ,  09/24/2015, 10:57 AM

## 2015-09-29 NOTE — L&D Delivery Note (Signed)
Delivery Note At 8:33 PM a viable and healthy female was delivered via Vaginal, Spontaneous Delivery (Presentation: OA; ROT).  APGAR: 9, 9; weight P .   Placenta status: delivered, intact .  Cord: 3V  with the following complications: nuchal x 1.  Anesthesia:  epidural Episiotomy: None Lacerations: 1st degree;Periurethral;B Labial Suture Repair: 3.0 vicryl rapide Est. Blood Loss (mL):  200cc  Mom to postpartum.  Baby to Couplet care / Skin to Skin.  Bovard-Stuckert,  06/10/2016, 9:24 PM  Br/RI/Tdap/Contra?/O+  Desires circumcision at the office

## 2015-10-14 ENCOUNTER — Telehealth (HOSPITAL_COMMUNITY): Payer: Self-pay

## 2015-10-14 NOTE — Telephone Encounter (Signed)
Telephone call with patient who reported she just found out today she is probably pregnant as she tested positive with a home testing kit.  Patient reports she has an appointment with the OBGY&N on 10/17/15 but questions how she should continue with medication as is currently taking 80 mg of Paxil at night and Vistaril as needed.  Informed patient Dr. Doyne Keel would not be back in the office until 10/15/15 but would question covering provider and call patient back.  Patient agree with plan.

## 2015-10-14 NOTE — Telephone Encounter (Signed)
Met with Dr. Adele Schilder to discuss patient's status with report of being pregnant and taking 80 mg of Paxil daily currently.  Agreed to call patient to inform to stop Paxil and Hydroxyzine she takes PRN.  Called patient to inform to d/c Paxil and Hydroxyzine.  Informed patient with large dosage of Paxil she is currently taking she can expect some withdrawal side effects such as possible headaches, flu-like symptoms, jitteriness, increased anxiety, etc.   Informed patient to call back if these became too extreme to manage or if bad and patient stated agreement.  Informed would let Dr. Doyne Keel know of discontinuation of psychiatric medications due to positive pregnancy test and per Dr. Adele Schilder instruction incase she had other recommendations upon her return to the office on 1/17/187.  Patient to call back as needed and agreed with plan.  Patient also warned of symptoms that may require emergent care with and stated understanding this and options for that type of treatment if needed as well.

## 2015-10-14 NOTE — Telephone Encounter (Signed)
She needed to stop Paxil due to teratogenic side effects.  She should consult Dr. Michae KavaAgarwal tomorrow morning for future recommendations.

## 2015-10-15 NOTE — Telephone Encounter (Signed)
Since she is pregnant it would be better if she d/c. If she is not able to tolerate that she can do  for a few days then stop.

## 2015-10-17 NOTE — Telephone Encounter (Signed)
Telephone call with patient to follow up on her progress with stopping Paxil.  Stated she had stopped the medication and was a little shaky for a few days with some GI upset but doing fine and thinks she is still fine with stopping Paxil and medications "cold [place]".  Informed Dr. Michae Kava had reported if discontinuation was too much and caused too many side effects then she could do Paxil 40 mg for a few days and then stop but patient stated she felt she would be fine not taking any more medication at this time.  Patient reported plan to see OBGY&N this date and questioned if she should come back in sooner as does not currently have an appointment until 12/24/15.  Patient agreed with plan to see OBGY&N and then call back to set up an earlier appointment with Dr. Michae Kava over the coming 2-3 weeks. Patient to call back if any problems prior to then.

## 2015-10-22 ENCOUNTER — Ambulatory Visit (HOSPITAL_COMMUNITY): Payer: Self-pay | Admitting: Psychology

## 2015-10-24 ENCOUNTER — Ambulatory Visit (HOSPITAL_COMMUNITY): Payer: Self-pay | Admitting: Psychiatry

## 2015-11-11 LAB — OB RESULTS CONSOLE GC/CHLAMYDIA
Chlamydia: NEGATIVE
GC PROBE AMP, GENITAL: NEGATIVE

## 2015-11-11 LAB — OB RESULTS CONSOLE RUBELLA ANTIBODY, IGM: Rubella: IMMUNE

## 2015-11-11 LAB — OB RESULTS CONSOLE HEPATITIS B SURFACE ANTIGEN: Hepatitis B Surface Ag: NEGATIVE

## 2015-11-11 LAB — OB RESULTS CONSOLE RPR: RPR: NONREACTIVE

## 2015-11-11 LAB — OB RESULTS CONSOLE HIV ANTIBODY (ROUTINE TESTING): HIV: NONREACTIVE

## 2015-12-03 ENCOUNTER — Encounter (HOSPITAL_COMMUNITY): Payer: Self-pay | Admitting: Psychology

## 2015-12-24 ENCOUNTER — Encounter (HOSPITAL_COMMUNITY): Payer: Self-pay | Admitting: Psychiatry

## 2015-12-24 ENCOUNTER — Ambulatory Visit (INDEPENDENT_AMBULATORY_CARE_PROVIDER_SITE_OTHER): Payer: 59 | Admitting: Psychiatry

## 2015-12-24 VITALS — BP 128/68 | HR 79 | Ht 63.0 in | Wt 125.4 lb

## 2015-12-24 DIAGNOSIS — F330 Major depressive disorder, recurrent, mild: Secondary | ICD-10-CM | POA: Diagnosis not present

## 2015-12-24 DIAGNOSIS — F410 Panic disorder [episodic paroxysmal anxiety] without agoraphobia: Secondary | ICD-10-CM | POA: Diagnosis not present

## 2015-12-24 DIAGNOSIS — F900 Attention-deficit hyperactivity disorder, predominantly inattentive type: Secondary | ICD-10-CM | POA: Diagnosis not present

## 2015-12-24 NOTE — Progress Notes (Signed)
BH MD/PA/NP OP Progress Note  12/24/2015 10:45 AM Allison Bernard  MRN:  [id number]  Subjective:   Pt is pregnant and is due Sept 17. Pt is working with her OB.   Pt stopped all her meds at [redacted] weeks pregnant.   Pt has a new job at [employer]. Pt is happy with it.   Depression has improved recently. A lot of it stems from living alone but plans to move in with boyfriend soon. Mood improves when she is with people. Denies isolation, anhedonia, worthlessness and hopelessness. Denies panic attacks.  Denies SI/HI.  Pt is eating ok. Sleep is good and she is getting about 8 hrs/night.  Energy is on the low side due to pregnancy.   Concentration is good.denies any current symptoms of ADD.  Irritability is present when tired or stressed. Denies AVH. Denies grandiouse thougths and ideas of reference and compulsions.    Chief Complaint: good Chief Complaint    Follow-up     Visit Diagnosis:     ICD-9-CM ICD-10-CM   1. Mild episode of recurrent major depressive disorder (HCC) 296.31 F33.0   2. Panic disorder without agoraphobia 300.01 F41.0   3. Attention deficit hyperactivity disorder (ADHD), predominantly inattentive type 314.01 F90.0     Past Medical History:  Past Medical History  Diagnosis Date  . Anxiety   . Depression   . Oppositional defiant disorder   . ADD (attention deficit disorder)   . Kidney infection   . Headache(784.0)     had migraines when she was younger  . Anemia   . Ectopic pregnancy   . 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 Pregnant     Past Surgical History  Procedure Laterality Date  . Tubes in ears      in the past  . Mass excision Left 07/04/2013    Procedure: EXCISION MASS;  Surgeon: Nadara Mustard, MD;  Location: [facility] OR;  Service: Orthopedics;  Laterality: Left;  Excision Left Foot Talo-calcaneous fibrous bar  . Ankle surgery    . Wisdom tooth extraction     Family History:  Family History  Problem Relation Age of Onset  . Depression Mother   . Anxiety  disorder Mother    Social History:  Social History   Social History  . Marital Status: Single    Spouse Name: N/A  . Number of Children: N/A  . Years of Education: N/A   Social History Main Topics  . Smoking status: Never Smoker   . Smokeless tobacco: Never Used  . Alcohol Use: Yes     Comment: 1 drink a month  . Drug Use: No  . Sexual Activity: Yes    Birth Control/ Protection: Pill   Other Topics Concern  . None   Social History Narrative   Additional History: She lives on her parents property. Reports that she has never smoked. She has never used smokeless tobacco. She reports that she drinks alcohol. She reports that she does not use illicit drugs.   Past Psychiatric History/Hospitalization(s): Anxiety: Yes Bipolar Disorder: No Depression: Yes Mania: No Psychosis: No Schizophrenia: No Personality Disorder: No Hospitalization for psychiatric illness: No History of Electroconvulsive Shock Therapy: No Prior Suicide Attempts: No   Musculoskeletal: Strength & Muscle Tone: within normal limits Gait & Station: normal Patient leans: straight  Psychiatric Specialty Exam: HPI  Review of Systems  Constitutional: Negative for fever, chills and weight loss.  HENT: Negative for congestion and sore throat.   Eyes: Negative for blurred  vision, double vision and redness.  Respiratory: Negative for cough, shortness of breath and wheezing.   Cardiovascular: Positive for chest pain and palpitations. Negative for leg swelling.  Gastrointestinal: Positive for nausea. Negative for heartburn, vomiting and abdominal pain.  Musculoskeletal: Negative for back pain, joint pain and neck pain.  Skin: Negative for itching and rash.  Neurological: Negative for dizziness, seizures, loss of consciousness and headaches.  Psychiatric/Behavioral: Positive for depression. Negative for suicidal ideas, hallucinations and substance abuse. The patient is nervous/anxious. The patient does not have  insomnia.     Blood pressure 128/68, pulse 79, height  (1.6 m), weight 125 lb 6.4 oz (56.881 kg), last menstrual period 03/10/2015.Body mass index is 22.22 kg/(m^2).  General Appearance: Fairly Groomed  Eye Contact:  Good  Speech:  Clear and Coherent and Normal Rate  Volume:  Normal  Mood:  Euthymic  Affect:  Full Range  Thought Process:  Goal Directed, Linear and Logical  Orientation:  Full (Time, Place, and Person)  Thought Content:  Negative  Suicidal Thoughts:  No  Homicidal Thoughts:  No  Memory:  Immediate;   Good Recent;   Good Remote;   Good  Judgement:  Good  Insight:  Good  Psychomotor Activity:  Normal  Concentration:  Good  Recall:  Good  Fund of Knowledge: Good  Language: Good  Akathisia:  No  Handed:  Right  AIMS (if indicated):  n/a  Assets:  Communication Skills Desire for Improvement Housing Intimacy Leisure Time Physical Health Resilience Social Support Talents/Skills Transportation Vocational/Educational  ADL's:  Intact  Cognition: WNL  Sleep:  good   Is the patient at risk to self?  No. Has the patient been a risk to self in the past 6 months?  No. Has the patient been a risk to self within the distant past?  No. Is the patient a risk to others?  No. Has the patient been a risk to others in the past 6 months?  No. Has the patient been a risk to others within the distant past?  No.  Current Medications: Current Outpatient Prescriptions  Medication Sig Dispense Refill  . Doxylamine-Pyridoxine (DICLEGIS) 10-10 MG TBEC Take by mouth.    . Prenatal Vit-Fe Fumarate-FA (PRENATAL MULTIVITAMIN) TABS tablet Take 1 tablet by mouth daily at 12 noon.    Marland Kitchen HYDROcodone-acetaminophen (NORCO) 5-325 MG per tablet Take 1 tablet by mouth every 6 (six) hours as needed for pain. (Patient not taking: Reported on 12/24/2015) 30 tablet 0  . HYDROcodone-acetaminophen (NORCO) 5-325 MG tablet Take 1 tablet by mouth every 6 (six) hours as needed for severe pain.  (Patient not taking: Reported on 12/24/2015) 10 tablet 0  . ibuprofen (ADVIL,MOTRIN) 200 MG tablet Take 400 mg by mouth every 6 (six) hours as needed for headache. Reported on 12/24/2015    . naproxen (NAPROSYN) 500 MG tablet Take 1 tablet (500 mg total) by mouth 2 (two) times daily as needed for mild pain, moderate pain or headache (TAKE WITH MEALS.). (Patient not taking: Reported on 12/24/2015) 20 tablet 0  . norgestrel-ethinyl estradiol (LO/OVRAL,CRYSELLE) 0.3-30 MG-MCG tablet Take 1 tablet by mouth daily. Reported on 12/24/2015    . promethazine (PHENERGAN) 25 MG tablet Take 1 tablet (25 mg total) by mouth every 6 (six) hours as needed for nausea or vomiting. (Patient not taking: Reported on 12/24/2015) 20 tablet 0  . Pseudoeph-Doxylamine-DM-APAP (NYQUIL PO) Take 1 Dose by mouth daily. Reported on 12/24/2015     No current facility-administered medications for this  visit.    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 We discussed teratogenic risks and pt Verbalized understanding and opted to continue. She will inform us if mood changes. -d/c Vistaril Medication management with supportive therapy. Risks/benefits and SE of the medication discussed. Pt verbalized understanding and verbal consent obtained for treatment. Affirm with the patient that the medications are taken as ordered. Patient expressed understanding of how their medications were to be used.    Labs: reviewed labs 03/30/2015 CBC WNL  Therapy: brief supportive therapy provided. Discussed psychosocial stressors in detail.   Encouraged to continue individual therapy  Pt denies SI and is at an acute low risk for suicide.Patient told to call clinic if any problems occur. Patient advised to go to ER if they should develop SI/HI, side effects, or if symptoms worsen. Has crisis numbers to call if needed. Pt verbalized understanding.  F/up in 2 months or sooner if needed           Allison DarterSalina  12/24/2015, 10:45 AM

## 2015-12-25 ENCOUNTER — Telehealth: Payer: Self-pay | Admitting: Cardiovascular Disease

## 2015-12-25 NOTE — Telephone Encounter (Signed)
[facility] with Dr Duke Salviaandolph 01/03/16.  [facility] for Dr Leonides Sakeandolph's schedule on 01/03/16., lp

## 2016-01-03 ENCOUNTER — Encounter: Payer: Self-pay | Admitting: Cardiovascular Disease

## 2016-01-03 ENCOUNTER — Ambulatory Visit (INDEPENDENT_AMBULATORY_CARE_PROVIDER_SITE_OTHER): Payer: Managed Care, Other (non HMO) | Admitting: Cardiovascular Disease

## 2016-01-03 VITALS — BP 96/66 | HR 91 | Ht 63.0 in | Wt 129.0 lb

## 2016-01-03 DIAGNOSIS — R072 Precordial pain: Secondary | ICD-10-CM | POA: Diagnosis not present

## 2016-01-03 DIAGNOSIS — R002 Palpitations: Secondary | ICD-10-CM | POA: Diagnosis not present

## 2016-01-03 HISTORY — DX: Palpitations: R00.2

## 2016-01-03 NOTE — Progress Notes (Signed)
Cardiology Office Note   Date:  01/03/2016   ID:  Milas KocherLauren A Bernard, DOB 02/14/1996, MRN [id number]  PCP:  Pcp Not In System  Cardiologist:   Madilyn Hookandolph,  P, MD  OB/GYN:  Dr. Jeanella FlatteryJody Stuckert  Chief Complaint  Patient presents with  . Palpitations    heart races and then chest pain 3-4 times a day    History of Present Illness:  Allison ChurchLauren A Bernard is a 20 y.o. [redacted] week pregnant female who presents for an evaluation of chest pain and palpitations.  2-3 weeks ago she started noticing episodes of palpitations. Her heart races for 2-3 minutes at a time. After the palpitations start she notices sharp substernal chest pain.  She denies shortness of breath but does have mild nausea when the episodes occur. She also notes mild lightheadedness but denies syncope. She called her OB/GYN who recommended that she limit her caffeine intake and referred her to cardiology for evaluation. Prior to the limiting her caffeine episodes occurred 4-5 times per day. Now it occurs once or twice daily. She was drinking one to 2 sodas daily and had one coffee per week. She is not having any caffeine.  Overall, Allison Bernard is feeling well. Now that she is in the second trimester she no longer has nausea and her energy levels have improved. She did have one episode of severe lightheadedness while at work. 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 She had to stop working and sit down but did not have syncope.  She denies lower extremity edema, orthopnea or PND. She does not have chest pain or palpitations with exercise. She dances 2-3 hours per week and likes to walk.  She denies any family history of premature coronary artery disease or sudden cardiac death.  Past Medical History  Diagnosis Date  . Anxiety   . Depression   . Oppositional defiant disorder   . ADD (attention deficit disorder)   . Kidney infection   . Headache(784.0)     had migraines when she was younger  . Anemia     . Ectopic pregnancy   . 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 Pregnant   . Palpitations 01/03/2016    Past Surgical History  Procedure Laterality Date  . Tubes in ears      in the past  . Mass excision Left 07/04/2013    Procedure: EXCISION MASS;  Surgeon: Nadara MustardMarcus V Duda, MD;  [facility];  Service: Orthopedics;  Laterality: Left;  Excision Left Foot Talo-calcaneous fibrous bar  . Ankle surgery    . Wisdom tooth extraction       Current Outpatient Prescriptions  Medication Sig Dispense Refill  . Doxylamine-Pyridoxine (DICLEGIS) 10-10 MG TBEC Take by mouth.    . Prenatal Vit-Fe Fumarate-FA (PRENATAL MULTIVITAMIN) TABS tablet Take 1 tablet by mouth daily at 12 noon.     No current facility-administered medications for this visit.    Allergies:   Review of patient's allergies indicates no known allergies.    Social History:  The patient  reports that she has never smoked. She has never used smokeless tobacco. She reports that she drinks alcohol. She reports that she does not use illicit drugs.   Family History:  The patient's family history includes Anxiety disorder in her mother; Depression in her mother; Heart failure in her maternal grandmother; Hypertension in her father, maternal grandfather, and maternal grandmother.    ROS:  Please see the history of present  illness.   Otherwise, review of systems are positive for none.   All other systems are reviewed and negative.    PHYSICAL EXAM: VS:  BP 96/66 mmHg  Pulse 91  Ht  (1.6 m)  Wt 58.514 kg (129 lb)  BMI 22.86 kg/m2  LMP 03/10/2015 , BMI Body mass index is 22.86 kg/(m^2). GENERAL:  Well appearing HEENT:  Pupils equal round and reactive, fundi not visualized, oral mucosa unremarkable NECK:  No jugular venous distention, waveform within normal limits, carotid upstroke brisk and symmetric, no bruits, no thyromegaly LYMPHATICS:  No cervical adenopathy LUNGS:  Clear to auscultation bilaterally HEART:  RRR.  PMI not displaced or  sustained,S1 and S2 within normal limits, no S3, no S4, no clicks, no rubs, no murmurs ABD:  Flat, positive bowel sounds normal in frequency in pitch, no bruits, no rebound, no guarding, no midline pulsatile mass, no hepatomegaly, no splenomegaly EXT:  2 plus pulses throughout, no edema, no cyanosis no clubbing SKIN:  No rashes no nodules NEURO:  Cranial nerves II through XII grossly intact, motor grossly intact throughout PSYCH:  Cognitively intact, oriented to person place and time   EKG:  EKG is ordered today. The ekg ordered today demonstrates sinus rhythm rate 91 bpm.   Recent Labs: 09/08/2015: ALT 60*; BUN 7; Creatinine, Ser 1.07*; Hemoglobin 13.6; Platelets 165; Potassium 3.0*; Sodium 136    Lipid Panel No results found for: CHOL, TRIG, HDL, CHOLHDL, VLDL, LDLCALC, LDLDIRECT    Wt Readings from Last 3 Encounters:  01/03/16 58.514 kg (129 lb)  12/24/15 56.881 kg (125 lb 6.4 oz)  09/24/15 58.514 kg (129 lb)      ASSESSMENT AND PLAN:  # Palpitations: Symptoms are likely PACs or PVCs. However, SVT is also more common in pregnancy.  We will check a CBC, BMP, magnesium, TSH and free T4. We will also have her wear a 48 hour Holter monitor to assess the underlying arrhythmia.   She is artery eliminating caffeine from her diet which helps. She also has a history of anxiety, which may be contributing to her symptoms.  # Chest pain: Symptoms occur In the setting of palpitations and not with exertion. Given that and the fact that she is 19, this is very unlikely to represent ischemia. Therefore we will defer stress testing at this time.   Current medicines are reviewed at length with the patient today.  The patient does not have concerns regarding medicines.  The following changes have been made:  no change  Labs/ tests ordered today include:   Orders Placed This Encounter  Procedures  . Holter monitor - 48 hour  . EKG 12-Lead     Disposition:   FU with  C. Duke Salvia,  MD, [facility] in 2 weeks.   This note was written with the assistance of speech recognition software.  Please excuse any transcriptional errors.  Signed,  C. Duke Salvia, MD, [facility]  01/03/2016 2:02 PM    [facility]

## 2016-01-03 NOTE — Patient Instructions (Signed)
Medication Instructions:  Your physician recommends that you continue on your current medications as directed. Please refer to the Current Medication list given to you today.  Labwork: Bmet/cbc/magnesium/tsh/ft4 when you have your labs at your upcoming doctors appointment   Testing/Procedures: Your physician has recommended that you wear a holter monitor. Holter monitors are medical devices that record the heart's electrical activity. Doctors most often use these monitors to diagnose arrhythmias. Arrhythmias are problems with the speed or rhythm of the heartbeat. The monitor is a small, portable device. You can wear one while you do your normal daily activities. This is usually used to diagnose what is causing palpitations/syncope (passing out). 48 hour at [facility] [address]  Follow-Up: Your physician recommends that you schedule a follow-up appointment in: 3 weeks  If you need a refill on your cardiac medications before your next appointment, please call your pharmacy.

## 2016-01-13 ENCOUNTER — Ambulatory Visit (INDEPENDENT_AMBULATORY_CARE_PROVIDER_SITE_OTHER): Payer: Managed Care, Other (non HMO)

## 2016-01-13 DIAGNOSIS — R002 Palpitations: Secondary | ICD-10-CM | POA: Diagnosis not present

## 2016-01-29 ENCOUNTER — Encounter: Payer: Self-pay | Admitting: Cardiovascular Disease

## 2016-01-29 ENCOUNTER — Ambulatory Visit (INDEPENDENT_AMBULATORY_CARE_PROVIDER_SITE_OTHER): Payer: Managed Care, Other (non HMO) | Admitting: Cardiovascular Disease

## 2016-01-29 VITALS — BP 103/69 | HR 85 | Ht 63.0 in | Wt 136.4 lb

## 2016-01-29 DIAGNOSIS — I491 Atrial premature depolarization: Secondary | ICD-10-CM | POA: Diagnosis not present

## 2016-01-29 DIAGNOSIS — I493 Ventricular premature depolarization: Secondary | ICD-10-CM

## 2016-01-29 HISTORY — DX: Ventricular premature depolarization: I49.3

## 2016-01-29 HISTORY — DX: Atrial premature depolarization: I49.1

## 2016-01-29 NOTE — Progress Notes (Signed)
Cardiology Office Note   Date:  01/29/2016   ID:  Allison Bernard, DOB [birth year], MRN [id number]  PCP:  Pcp Not In System  Cardiologist:   Chilton Si, MD  OB/GYN:  Dr. Jeanella Flattery  Chief Complaint  Patient presents with  . Follow-up    SOB;none. CHEST none.LIGHTHEADED/DIZZINESS;none. PAIN OR CRAMPING IN LEGS;none.EDEMA; none.    Patient ID: Allison Bernard is a 20 y.o. [redacted] week pregnant female who presents for an evaluation of chest pain and palpitations.    Interval History 01/29/16:  After her last appointment Allison Bernard  wore a 48-hour Holter that revealed occasional PACs and PVCs as well as sinus arrhythmia. Additionally, it showed one episode of second degree heart block. It was unclear whether it was Mobitz 1 or Mobitz 2.   She has been feeling well.  She denies chest pain or shortness of breath.  She continues to have occasional palpitations but they are occurring much less frequently.  She denies chest pain, shortness of breath, lower extremity edema, orthopnea or PND.  She found out that she is having a boy.  She has been avoiding caffeine.  History of Present Illness 01/03/16:  2-3 weeks ago she started noticing episodes of palpitations. Her heart races for 2-3 minutes at a time. After the palpitations start she notices sharp substernal chest pain.  She denies shortness of breath but does have mild nausea when the episodes occur. She also notes mild lightheadedness but denies syncope. She called her OB/GYN who recommended that she limit her caffeine intake and referred her to cardiology for evaluation. Prior to the limiting her caffeine episodes occurred 4-5 times per day. Now it occurs once or twice daily. She was drinking one to 2 sodas daily and had one coffee per week. She is not having any caffeine.  Overall, Ms. Allison Bernard is feeling well. Now that she is in the second trimester she no longer has nausea and her energy levels have improved. She did have one  episode of severe lightheadedness while at work. 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 She had to stop working and sit down but did not have syncope.  She denies lower extremity edema, orthopnea or PND. She does not have chest pain or palpitations with exercise. She dances 2-3 hours per week and likes to walk.  She denies any family history of premature coronary artery disease or sudden cardiac death.  Past Medical History  Diagnosis Date  . Anxiety   . Depression   . Oppositional defiant disorder   . ADD (attention deficit disorder)   . Kidney infection   . Headache(784.0)     had migraines when she was younger  . Anemia   . Ectopic pregnancy   . 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 Pregnant   . Palpitations 01/03/2016  . PAC (premature atrial contraction) 01/29/2016  . PVC (premature ventricular contraction) 01/29/2016    Past Surgical History  Procedure Laterality Date  . Tubes in ears      in the past  . Mass excision Left 07/04/2013    Procedure: EXCISION MASS;  Surgeon: Nadara Mustard, MD;  Location: [facility] OR;  Service: Orthopedics;  Laterality: Left;  Excision Left Foot Talo-calcaneous fibrous bar  . Ankle surgery    . Wisdom tooth extraction      Current Outpatient Prescriptions  Medication Sig Dispense Refill  . Doxylamine-Pyridoxine (DICLEGIS) 10-10 MG TBEC Take by mouth.    Marland Kitchen  Prenatal Vit-Fe Fumarate-FA (PRENATAL MULTIVITAMIN) TABS tablet Take 1 tablet by mouth daily at 12 noon.     No current facility-administered medications for this visit.    Allergies:   Review of patient's allergies indicates no known allergies.    Social History:  The patient  reports that she has never smoked. She has never used smokeless tobacco. She reports that she drinks alcohol. She reports that she does not use illicit drugs.   Family History:  The patient's family history includes Anxiety disorder in her mother; Depression in her mother; Heart failure in her maternal grandmother;  Hypertension in her father, maternal grandfather, and maternal grandmother.    ROS:  Please see the history of present illness.   Otherwise, review of systems are positive for none.   All other systems are reviewed and negative.    PHYSICAL EXAM: VS:  BP 103/69 mmHg  Pulse 85  Ht 5\' 3"  (1.6 m)  Wt 61.871 kg (136 lb 6.4 oz)  BMI 24.17 kg/m2  LMP 03/10/2015 , BMI Body mass index is 24.17 kg/(m^2). GENERAL:  Well appearing HEENT:  Pupils equal round and reactive, fundi not visualized, oral mucosa unremarkable NECK:  No jugular venous distention, waveform within normal limits, carotid upstroke brisk and symmetric, no bruits, no thyromegaly LYMPHATICS:  No cervical adenopathy LUNGS:  Clear to auscultation bilaterally HEART:  RRR.  PMI not displaced or sustained,S1 and S2 within normal limits, no S3, no S4, no clicks, no rubs, no murmurs ABD:  Flat, positive bowel sounds normal in frequency in pitch, no bruits, no rebound, no guarding, no midline pulsatile mass, no hepatomegaly, no splenomegaly EXT:  2 plus pulses throughout, no edema, no cyanosis no clubbing SKIN:  No rashes no nodules NEURO:  Cranial nerves II through XII grossly intact, motor grossly intact throughout PSYCH:  Cognitively intact, oriented to person place and time   EKG:  EKG is ordered today. The ekg ordered today demonstrates sinus rhythm rate 91 bpm.  48 Hour Holter Monitor 01/15/16:  Quality: Fair. Baseline artifact.  Average heart rate: 96 bpm Max heart rate: 148 bpm Min heart rate: 66 bpm  Occasioanl PVCs and PACs One episode of second degree heart block. Sinus arrhythmia with pauses <2 seconds. Unable to determine Mobitz I vs Mobtiz II.  Recent Labs: 09/08/2015: ALT 60*; BUN 7; Creatinine, Ser 1.07*; Hemoglobin 13.6; Platelets 165; Potassium 3.0*; Sodium 136    Lipid Panel No results found for: CHOL, TRIG, HDL, CHOLHDL, VLDL, LDLCALC, LDLDIRECT    Wt Readings from Last 3 Encounters:  01/29/16  61.871 kg (136 lb 6.4 oz)  01/03/16 58.514 kg (129 lb)  12/24/15 56.881 kg (125 lb 6.4 oz)      ASSESSMENT AND PLAN:  # PACs/PVCs Symptoms are occurring less frequently.  We will not start any medications at this time.   # Chest pain: Resolved.   Current medicines are reviewed at length with the patient today.  The patient does not have concerns regarding medicines.  The following changes have been made:  no change  Labs/ tests ordered today include:   No orders of the defined types were placed in this encounter.     Disposition:   FU with  C. Duke Salviaandolph, MD, [facility] as needed.   This note was written with the assistance of speech recognition software.  Please excuse any transcriptional errors.  Signed,  C. Duke Salviaandolph, MD, [facility]  01/29/2016 7:01 PM    [facility]

## 2016-01-29 NOTE — Patient Instructions (Signed)
Medication Instructions:  Your physician recommends that you continue on your current medications as directed. Please refer to the Current Medication list given to you today.   Labwork: none  Testing/Procedures: none  Follow-Up: Follow up with Dr. Duke Salviaandolph as needed.   Any Other Special Instructions Will Be Listed Below (If Applicable).     If you need a refill on your cardiac medications before your next appointment, please call your pharmacy.

## 2016-01-30 ENCOUNTER — Encounter (HOSPITAL_COMMUNITY): Payer: Self-pay | Admitting: Psychology

## 2016-01-30 DIAGNOSIS — F411 Generalized anxiety disorder: Secondary | ICD-10-CM

## 2016-01-30 NOTE — Progress Notes (Signed)
Allison KocherLauren A Bernard is a 20 y.o. female patient being discharged from counseling as last seen on 08/20/15 and didn't respond to 12/03/15 re: scheduling an appointment to f/u.  Outpatient Therapist Discharge Summary  Allison KocherLauren A Bernard    04/09/1996   Admission Date: 06/06/15   Discharge Date:  01/30/16 Reason for Discharge:  Not active Diagnosis:   Generalized anxiety disorder and MDD    Comments:  Pt will continue as scheduled w/ Dr. Michae KavaAgarwal.  Alesia BandaLeanne Harrison MonsM            ,, [facility]

## 2016-02-02 ENCOUNTER — Encounter (HOSPITAL_COMMUNITY): Payer: Self-pay | Admitting: *Deleted

## 2016-02-06 ENCOUNTER — Encounter (HOSPITAL_COMMUNITY): Payer: Self-pay | Admitting: *Deleted

## 2016-02-06 ENCOUNTER — Inpatient Hospital Stay (HOSPITAL_COMMUNITY)
Admission: AD | Admit: 2016-02-06 | Discharge: 2016-02-06 | Disposition: A | Discharge status: Home / Self Care | Payer: Managed Care, Other (non HMO) | Source: Ambulatory Visit | Attending: Obstetrics and Gynecology | Admitting: Obstetrics and Gynecology

## 2016-02-06 DIAGNOSIS — Z87891 Personal history of nicotine dependence: Secondary | ICD-10-CM | POA: Diagnosis not present

## 2016-02-06 DIAGNOSIS — Z3A21 21 weeks gestation of pregnancy: Secondary | ICD-10-CM | POA: Insufficient documentation

## 2016-02-06 DIAGNOSIS — Z79899 Other long term (current) drug therapy: Secondary | ICD-10-CM | POA: Diagnosis not present

## 2016-02-06 DIAGNOSIS — O26892 Other specified pregnancy related conditions, second trimester: Secondary | ICD-10-CM | POA: Diagnosis not present

## 2016-02-06 DIAGNOSIS — O368120 Decreased fetal movements, second trimester, not applicable or unspecified: Secondary | ICD-10-CM | POA: Insufficient documentation

## 2016-02-06 DIAGNOSIS — F329 Major depressive disorder, single episode, unspecified: Secondary | ICD-10-CM | POA: Diagnosis not present

## 2016-02-06 DIAGNOSIS — Z842 Family history of other diseases of the genitourinary system: Secondary | ICD-10-CM | POA: Insufficient documentation

## 2016-02-06 DIAGNOSIS — O368121 Decreased fetal movements, second trimester, fetus 1: Secondary | ICD-10-CM

## 2016-02-06 DIAGNOSIS — Z833 Family history of diabetes mellitus: Secondary | ICD-10-CM | POA: Diagnosis not present

## 2016-02-06 DIAGNOSIS — F419 Anxiety disorder, unspecified: Secondary | ICD-10-CM | POA: Diagnosis not present

## 2016-02-06 DIAGNOSIS — Z8249 Family history of ischemic heart disease and other diseases of the circulatory system: Secondary | ICD-10-CM | POA: Insufficient documentation

## 2016-02-06 DIAGNOSIS — Z3492 Encounter for supervision of normal pregnancy, unspecified, second trimester: Secondary | ICD-10-CM

## 2016-02-06 DIAGNOSIS — R300 Dysuria: Secondary | ICD-10-CM | POA: Diagnosis not present

## 2016-02-06 HISTORY — DX: Unspecified infectious disease: B99.9

## 2016-02-06 LAB — URINE MICROSCOPIC-ADD ON
Bacteria, UA: NONE SEEN
RBC / HPF: NONE SEEN RBC/hpf (ref 0–5)

## 2016-02-06 LAB — URINALYSIS, ROUTINE W REFLEX MICROSCOPIC
Bilirubin Urine: NEGATIVE
GLUCOSE, UA: NEGATIVE mg/dL
HGB URINE DIPSTICK: NEGATIVE
Ketones, ur: NEGATIVE mg/dL
Nitrite: NEGATIVE
Protein, ur: NEGATIVE mg/dL
pH: 6 (ref 5.0–8.0)

## 2016-02-06 LAB — WET PREP, GENITAL
SPERM: NONE SEEN
Trich, Wet Prep: NONE SEEN
Yeast Wet Prep HPF POC: NONE SEEN

## 2016-02-06 NOTE — MAU Note (Signed)
Feeling more movement now.  Last couple days it just wasn't as much, scared her.

## 2016-02-06 NOTE — MAU Provider Note (Signed)
Chief Complaint:  Decreased Fetal Movement   First Provider Initiated Contact with Patient 02/06/16 1902      HPI: Allison Bernard is a 20 y.o. G2P0010 at [redacted]w[redacted]d who presents to maternity admissions reporting she normally feels her baby move all the time but has not felt movement in several hours today.  She does feel movement since arriving in MAU.  She reports some burning with urination x 3 days.  She has not tried any treatments and the discomfort is intermittent and unchanged since onset.   She denies LOF, vaginal bleeding, vaginal itching/burning, h/a, dizziness, n/v, or fever/chills.    HPI  Past Medical History: Past Medical History  Diagnosis Date  . Anxiety   . Oppositional defiant disorder   . ADD (attention deficit disorder)   . Kidney infection   . Headache(784.0)     had migraines when she was younger  . Anemia   . Ectopic pregnancy   . 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 Pregnant   . Palpitations 01/03/2016  . PAC (premature atrial contraction) 01/29/2016  . PVC (premature ventricular contraction) 01/29/2016  . Ectopic pregnancy     received methotrexate  . Infection     UTI  . Depression     no meds, doing ok    Past obstetric history: OB History  Gravida Para Term Preterm AB SAB TAB Ectopic Multiple Living  0     # Outcome Date GA Lbr Len/2nd Weight Sex Delivery Anes PTL Lv  2 Current           1 Ectopic               Past Surgical History: Past Surgical History  Procedure Laterality Date  . Tubes in ears      in the past  . Mass excision Left 07/04/2013    Procedure: EXCISION MASS;  Surgeon: Nadara Mustard, MD;  Location: [facility];  Service: Orthopedics;  Laterality: Left;  Excision Left Foot Talo-calcaneous fibrous bar  . Ankle surgery Left     reconstruction   . Wisdom tooth extraction      Family History: Family History  Problem Relation Age of Onset  . Depression Mother   . Anxiety disorder Mother   . Hypertension Father   . Diabetes Father   .  Heart failure Maternal Grandmother   . Hypertension Maternal Grandmother   . Cancer Maternal Grandmother     4 types  . Hypertension Maternal Grandfather   . Mesothelioma Maternal Grandfather   . Cancer Paternal Grandmother     uterine    Social History: Social History  Substance Use Topics  . Smoking status: Former Games developer  . Smokeless tobacco: Never Used     Comment: quit prior to preg  . Alcohol Use: No     Comment: 1 drink a month    Allergies: No Known Allergies  Meds:  Prescriptions prior to admission  Medication Sig Dispense Refill Last Dose  . Doxylamine-Pyridoxine (DICLEGIS) 10-10 MG TBEC Take by mouth.   Taking  . Prenatal Vit-Fe Fumarate-FA (PRENATAL MULTIVITAMIN) TABS tablet Take 1 tablet by mouth daily at 12 noon.   Taking    ROS:  Review of Systems  Constitutional: Negative for fever, chills and fatigue.  Respiratory: Negative for shortness of breath.   Cardiovascular: Negative for chest pain.  Genitourinary: Positive for dysuria. Negative for urgency, frequency, flank pain, vaginal bleeding, vaginal discharge, difficulty urinating, vaginal pain  and pelvic pain.  Neurological: Negative for dizziness and headaches.  Psychiatric/Behavioral: Negative.      I have reviewed patient's Past Medical Hx, Surgical Hx, Family Hx, Social Hx, medications and allergies.   Physical Exam   Patient Vitals for the past 24 hrs:  BP Pulse Resp Height Weight  02/06/16 1742 - - - 5\' 3"  (1.6 m) 138 lb 6.4 oz (62.778 kg)  02/06/16 1740 105/57 mmHg 81 18 - -   Constitutional: Well-developed, well-nourished female in no acute distress.  Cardiovascular: normal rate Respiratory: normal effort GI: Abd soft, non-tender, gravid appropriate for gestational age.  MS: Extremities nontender, no edema, normal ROM Neurologic: Alert and oriented x 4.  GU: Neg CVAT.  GCC and wet prep collected with blind swab    FHT:  145 by doppler Labs: Results for orders placed or performed  during the hospital encounter of 02/06/16 (from the past 24 hour(s))  Urinalysis, Routine w reflex microscopic (not at [facility])     Status: Abnormal   Collection Time: 02/06/16  5:45 PM  Result Value Ref Range   Color, Urine STRAW (A) YELLOW   APPearance CLEAR CLEAR   Specific Gravity, Urine <1.005 (L) 1.005 - 1.030   pH 6.0 5.0 - 8.0   Glucose, UA NEGATIVE NEGATIVE mg/dL   Hgb urine dipstick NEGATIVE NEGATIVE   Bilirubin Urine NEGATIVE NEGATIVE   Ketones, ur NEGATIVE NEGATIVE mg/dL   Protein, ur NEGATIVE NEGATIVE mg/dL   Nitrite NEGATIVE NEGATIVE   Leukocytes, UA TRACE (A) NEGATIVE  Urine microscopic-add on     Status: Abnormal   Collection Time: 02/06/16  5:45 PM  Result Value Ref Range   Squamous Epithelial / LPF 0-5 (A) NONE SEEN   WBC, UA 0-5 0 - 5 WBC/hpf   RBC / HPF NONE SEEN 0 - 5 RBC/hpf   Bacteria, UA NONE SEEN NONE SEEN   --/--/O POS (07/02 1125)  Imaging:  No results found.  MAU Course/MDM: I have ordered labs and reviewed results.  Normal FHT and movement in MAU reassuring for pt.  Consult Dr Mindi SlickerBanga.  Wet prep/GCC pending. Urine sent for culture.  Discussed expected fetal movement at 21 weeks, reassurance provided.  Pt to keep appt next week in office.  Pt stable at time of discharge.  Assessment: 1. Decreased fetal movement, second trimester, fetus 1   2. Fetal heart rate present, second trimester   3. Dysuria     Plan: Discharge home F/U in office as scheduled Return to MAU as needed for emergencies    Medication List    TAKE these medications        DICLEGIS 10-10 MG Tbec  Generic drug:  Doxylamine-Pyridoxine  Take by mouth.     prenatal multivitamin Tabs tablet  Take 1 tablet by mouth daily at 12 noon.        Sharen CounterLisa Leftwich-Kirby Certified Nurse-Midwife 02/06/2016 7:21 PM

## 2016-02-06 NOTE — Discharge Instructions (Signed)

## 2016-02-06 NOTE — MAU Note (Signed)
Pt reprots she has not felt the baby move much today. Stated she usually feel the baby  A lot after she eats. Alsos c/o burning with urination.

## 2016-02-07 LAB — GC/CHLAMYDIA PROBE AMP (~~LOC~~) NOT AT ARMC
Chlamydia: NEGATIVE
Neisseria Gonorrhea: NEGATIVE

## 2016-02-08 LAB — CULTURE, OB URINE

## 2016-02-25 ENCOUNTER — Encounter (HOSPITAL_COMMUNITY): Payer: Self-pay | Admitting: Psychiatry

## 2016-02-25 ENCOUNTER — Ambulatory Visit (INDEPENDENT_AMBULATORY_CARE_PROVIDER_SITE_OTHER): Payer: Managed Care, Other (non HMO) | Admitting: Psychiatry

## 2016-02-25 VITALS — BP 100/65 | HR 81 | Ht 63.0 in | Wt 140.6 lb

## 2016-02-25 DIAGNOSIS — F900 Attention-deficit hyperactivity disorder, predominantly inattentive type: Secondary | ICD-10-CM | POA: Diagnosis not present

## 2016-02-25 DIAGNOSIS — F331 Major depressive disorder, recurrent, moderate: Secondary | ICD-10-CM

## 2016-02-25 DIAGNOSIS — F410 Panic disorder [episodic paroxysmal anxiety] without agoraphobia: Secondary | ICD-10-CM | POA: Diagnosis not present

## 2016-02-25 NOTE — Progress Notes (Signed)
Patient ID: Allison Bernard, female   DOB: [birth year], 20 y.o.   MRN: [id number]  [facility] MD/PA/NP OP Progress Note  02/25/2016 3:47 PM Allison Bernard  MRN:  [id number]  Subjective:   Pt is pregnant and is due Sept 17. Pt is working with her OB. Pt states the second trimester is better and she is no longer having N/V.   Pt stopped all her meds at [redacted] weeks pregnant.   Pt is working at [employer]. Pt is happy with it.   Depression has worsened recently. A lot of it stems from living with her parents. Pt is looking for a larger space.  Pt is having a lot of low motivation. Mood improves when she is with people. Reports some isolation and anhedonia. Denies worthlessness and hopelessness. Denies panic attacks.  Denies SI/HI.  Pt is eating ok. Sleep is good and she is getting about 8 hrs/night.  Energy is on the low side due to pregnancy.   Concentration is good. Denies any current symptoms of ADD.  Irritability is present when tired or stressed. Pt reports she is having a low frustration tolerance. Denies AVH. Denies grandiouse thougths and ideas of reference and compulsions.   Pt saw a cardiologist for her chest pain. Pt reports she has a pregnancy associated arrhthymias.   Chief Complaint: going ok Chief Complaint    Follow-up     Visit Diagnosis:     ICD-9-CM ICD-10-CM   1. MDD (major depressive disorder), recurrent episode, moderate (HCC) 296.32 F33.1   2. Panic disorder without agoraphobia 300.01 F41.0   3. Attention deficit hyperactivity disorder (ADHD), predominantly inattentive type 314.01 F90.0     Past Medical History:  Past Medical History  Diagnosis Date  . Anxiety   . Oppositional defiant disorder   . ADD (attention deficit disorder)   . Kidney infection   . Headache(784.0)     had migraines when she was younger  . Anemia   . Ectopic pregnancy   . 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 Pregnant   . Palpitations 01/03/2016  . PAC (premature atrial contraction) 01/29/2016  . PVC  (premature ventricular contraction) 01/29/2016  . Ectopic pregnancy     received methotrexate  . Infection     UTI  . Depression     no meds, doing ok    Past Surgical History  Procedure Laterality Date  . Tubes in ears      in the past  . Mass excision Left 07/04/2013    Procedure: EXCISION MASS;  Surgeon: Nadara Mustard, MD;  [facility];  Service: Orthopedics;  Laterality: Left;  Excision Left Foot Talo-calcaneous fibrous bar  . Ankle surgery Left     reconstruction   . Wisdom tooth extraction     Family History:  Family History  Problem Relation Age of Onset  . Depression Mother   . Anxiety disorder Mother   . Hypertension Father   . Diabetes Father   . Heart failure Maternal Grandmother   . Hypertension Maternal Grandmother   . Cancer Maternal Grandmother     4 types  . Hypertension Maternal Grandfather   . Mesothelioma Maternal Grandfather   . Cancer Paternal Grandmother     uterine   Social History:  Social History   Social History  . Marital Status: Single    Spouse Name: N/A  . Number of Children: N/A  . Years of Education: N/A   Social History Main Topics  . Smoking status: Former  Smoker  . Smokeless tobacco: Never Used     Comment: quit prior to preg  . Alcohol Use: No     Comment: 1 drink a month  . Drug Use: No  . Sexual Activity: Yes    Birth Control/ Protection: Pill   Other Topics Concern  . None   Social History Narrative   Additional History: She lives on her parents property. Reports that she has never smoked. She has never used smokeless tobacco. She reports that she drinks alcohol. She reports that she does not use illicit drugs.   Past Psychiatric History/Hospitalization(s): Anxiety: Yes Bipolar Disorder: No Depression: Yes Mania: No Psychosis: No Schizophrenia: No Personality Disorder: No Hospitalization for psychiatric illness: No History of Electroconvulsive Shock Therapy: No Prior Suicide Attempts:  No   Musculoskeletal: Strength & Muscle Tone: within normal limits Gait & Station: normal Patient leans: straight  Psychiatric Specialty Exam: HPI  Review of Systems  Constitutional: Negative for fever, chills and weight loss.  HENT: Negative for congestion and sore throat.   Eyes: Negative for blurred vision, double vision and redness.  Respiratory: Negative for cough, shortness of breath and wheezing.   Cardiovascular: Positive for chest pain and palpitations. Negative for leg swelling.  Gastrointestinal: Negative for heartburn, nausea, vomiting and abdominal pain.  Musculoskeletal: Negative for back pain, joint pain and neck pain.  Skin: Negative for itching and rash.  Neurological: Negative for dizziness, seizures, loss of consciousness and headaches.  Psychiatric/Behavioral: Positive for depression. Negative for suicidal ideas, hallucinations and substance abuse. The patient is nervous/anxious. The patient does not have insomnia.     Blood pressure 100/65, pulse 81, height 5\' 3"  (1.6 m), weight 140 lb 9.6 oz (63.776 kg), last menstrual period 03/10/2015, unknown if currently breastfeeding.Body mass index is 24.91 kg/(m^2).  General Appearance: Fairly Groomed  Eye Contact:  Good  Speech:  Clear and Coherent and Normal Rate  Volume:  Normal  Mood:  Depressed  Affect:  Full Range  Thought Process:  Goal Directed, Linear and Logical  Orientation:  Full (Time, Place, and Person)  Thought Content:  Negative  Suicidal Thoughts:  No  Homicidal Thoughts:  No  Memory:  Immediate;   Good Recent;   Good Remote;   Good  Judgement:  Good  Insight:  Good  Psychomotor Activity:  Normal  Concentration:  Good  Recall:  Good  Fund of Knowledge: Good  Language: Good  Akathisia:  No  Handed:  Right  AIMS (if indicated):  n/a  Assets:  Communication Skills Desire for Improvement Housing Intimacy Leisure Time Physical Health Resilience Social  Support Talents/Skills Transportation Vocational/Educational  ADL's:  Intact  Cognition: WNL  Sleep:  good   Is the patient at risk to self?  No. Has the patient been a risk to self in the past 6 months?  No. Has the patient been a risk to self within the distant past?  No. Is the patient a risk to others?  No. Has the patient been a risk to others in the past 6 months?  No. Has the patient been a risk to others within the distant past?  No.  Current Medications: Current Outpatient Prescriptions  Medication Sig Dispense Refill  . Prenatal Vit-Fe Fumarate-FA (PRENATAL MULTIVITAMIN) TABS tablet Take 1 tablet by mouth daily at 12 noon.    . Doxylamine-Pyridoxine (DICLEGIS) 10-10 MG TBEC Take by mouth. Reported on 02/25/2016     No current facility-administered medications for this visit.    Medical Decision Making:  Established Problem, Stable/Improving (1), Review of Psycho-Social Stressors (1) and Review of Medication Regimen & Side Effects (2)  Treatment Plan Summary:Medication management and Plan see below  Axis I: Major depressive disorder- recurrent, moderate; Panic disorder without agorophobia; ADHD inattentive type moderate severity  Axis II: Deferred   Plan:  -discussed Wellbutrin but pt feels she is not ready to take meds Pt is pregnant and is not actively taking medsl. We discussed teratogenic risks and pt Verbalized understanding and opted not to take meds at this time. She will inform us if mood changes.  Medication management with supportive therapy. Risks/benefits and SE of the medication discussed. Pt verbalized understanding and verbal consent obtained for treatment. Affirm with the patient that the medications are taken as ordered. Patient expressed understanding of how their medications were to be used.    Labs: reviewed labs 03/30/2015 CBC WNL  Therapy: brief supportive therapy provided. Discussed psychosocial stressors in detail.  Encouraged to restart  individual therapy Declined IOP due to work  Pt denies SI and is at an acute low risk for suicide.Patient told to call clinic if any problems occur. Patient advised to go to ER if they should develop SI/HI, side effects, or if symptoms worsen. Has crisis numbers to call if needed. Pt verbalized understanding.  F/up in 2 months or sooner if needed           Oletta DarterSalina  02/25/2016, 3:47 PM

## 2016-02-26 ENCOUNTER — Encounter (HOSPITAL_COMMUNITY): Payer: Self-pay | Admitting: Psychology

## 2016-02-26 ENCOUNTER — Ambulatory Visit (INDEPENDENT_AMBULATORY_CARE_PROVIDER_SITE_OTHER): Payer: Managed Care, Other (non HMO) | Admitting: Psychology

## 2016-02-26 DIAGNOSIS — F331 Major depressive disorder, recurrent, moderate: Secondary | ICD-10-CM

## 2016-02-26 DIAGNOSIS — F411 Generalized anxiety disorder: Secondary | ICD-10-CM

## 2016-02-26 NOTE — Progress Notes (Signed)
   THERAPIST PROGRESS NOTE  Session Time: 10am-10.50am  Participation Level: Active  Behavioral Response: Well GroomedAlertAnxious and Depressed  Type of Therapy: Individual Therapy  Treatment Goals addressed: Diagnosis: MDD, GAD and goal 1  Interventions: CBT, Supportive and Other: tx planning  Summary: Xariah A Burchfield is a 20 y.o. female who presents to reestablish counseling as increased depression and anxiety symptoms in past 1-2 months.  Pt is currently pregnant due 06/14/16 w/ a baby boy.  Pt reported she isn't taking any medication current and seeking counseling to assist w/ increased ruminating on worries, negative self talk, thoughts that things aren't going to work out, doubting if she will be a good mom.  Pt reported that she is having increased struggles w/ motivation and loss of interest.  Pt reports she has been working 5 months at [employer] and enjoys her job.  Pt reported that her boyfriend moved from [place] in February have been looking for apartment w/ more space- then current trailer on parents property.  Pt reports that this has been a major stressor of the past month as have been actively looking and running into barriers because have a large dog.  Pt acknowledged that she is putting on self full responsibility for getting this accomplished but does have partner willing to help.  Pt reports she hasn't expressed her concerns and feelings as worries that she will be "too much" for boyfriend.  Pt was able to identify cognitive distortion and that he is actually very supportive.  Pt discussed steps she can take to focus on positive self care, expressing feeling to partner and continued engagment and not withdrawing.   Suicidal/Homicidal: Nowithout intent/plan  Therapist Response: Assessed pt current functioning per pt report.  Processed w/pt her increase of depression and anxiety.  Assisted pt in recognizing connection of thinking patterns and mood.  Explored w/pt her  supports and how to utilize.  Normalized worries re: parenting.  Discussed need to express her thoughts and feelings and communicate needs.  Develop tx plan.  Plan: Return again in 2 weeks.  Diagnosis: MDD, GAD    Forde RadonYATES,, LPC 02/26/2016

## 2016-03-12 ENCOUNTER — Ambulatory Visit (INDEPENDENT_AMBULATORY_CARE_PROVIDER_SITE_OTHER): Payer: Managed Care, Other (non HMO) | Admitting: Psychology

## 2016-03-12 DIAGNOSIS — F411 Generalized anxiety disorder: Secondary | ICD-10-CM | POA: Diagnosis not present

## 2016-03-12 NOTE — Progress Notes (Signed)
   THERAPIST PROGRESS NOTE  Session Time: 12.30pm-1.15pm  Participation Level: Active  Behavioral Response: Well GroomedAlertAnxious  Type of Therapy: Individual Therapy  Treatment Goals addressed: Diagnosis: GAD and goal 1  Interventions: CBT and Supportive  Summary: Allison KocherLauren A Bernard is a 20 y.o. female who presents with report of anxiety.  Pt reported she moved in to apartment w/her fiance on 03/09/16.  Pt reports likes the apartment, space and location- but doesn't feel settled there yet and does get anxious when staying alone.  Pt reported that the dog is adjusting well to space and she is focusing on taking walks for coping.  Pt reports that she has been able to express her feelings w/ her fiance and this was helpful.  Pt discussed that still figuring out new routine and increased awareness that will take time.  Pt also reported that she gets overwhelmed about organizing and unpacking and immobilizes.  Pt practice grounding and how to use for settling self to make decisions and not avoid.  Pt did report that feels better about work and that she has been heard and that coworker assisted and getting different perspective.   Suicidal/Homicidal: Nowithout intent/plan  Therapist Response: Assessed pt current functioning per her report.  Processed w/pt her transition to new living space- unrealistic expectations of being settle already and focus on positives of coping she has used. [facility] [employer] practice and how to utilize for self w/ anxiety.  Plan: Return again in 2 weeks.  Diagnosis: GAD    Forde RadonYATES,, [facility] 03/12/2016

## 2016-03-25 ENCOUNTER — Ambulatory Visit (INDEPENDENT_AMBULATORY_CARE_PROVIDER_SITE_OTHER): Payer: Managed Care, Other (non HMO) | Admitting: Psychology

## 2016-03-25 DIAGNOSIS — F411 Generalized anxiety disorder: Secondary | ICD-10-CM | POA: Diagnosis not present

## 2016-03-25 DIAGNOSIS — F330 Major depressive disorder, recurrent, mild: Secondary | ICD-10-CM

## 2016-03-26 NOTE — Progress Notes (Signed)
   THERAPIST PROGRESS NOTE  Session Time: 3.40pm-4.25pm  Participation Level: Active  Behavioral Response: Well GroomedAlertAnxious  Type of Therapy: Individual Therapy  Treatment Goals addressed: Diagnosis: GAD and goal 1  Interventions: CBT and Supportive  Summary: Milas KocherLauren A Burchfield is a 20 y.o. female who presents with some continued anxiety.  Pt reports that she is feeling more settled in her new space and only struggling w/ anxiety when alone in the apartment. Pt is able to validate and normalize this and acknowledge will take time to adjust.  Pt discussed some of her anxious thoughts and was able to identify that avoiding in some ways- but that is aware and planning for no avoidance- pt discussed steps today she is taking when leaves her to interact and handle separating from her pet individually and how to cope through.  Pt also discussed stressor at work- believes not paid for some hours and feeling uncomfortable addressing.  Pt was able to practice how she can be assert about and steps can take if worst case scenario.     Suicidal/Homicidal: Nowithout intent/plan  Therapist Response: Assessed pt current functioning per pt report. Processed w/pt coping through stressors and anxiety.  Discussed role of avoidance in maintaining anxiety and how to plan for non avoidance w/ coping skills and supports.  Discussed ways to assert her thoughts w/ [facility].  Plan: Return again in 2 weeks.  Diagnosis: GAD, MDD    YATES,LEANNE, [facility] 03/26/2016

## 2016-04-08 ENCOUNTER — Ambulatory Visit (HOSPITAL_COMMUNITY): Payer: Self-pay | Admitting: Psychology

## 2016-04-22 ENCOUNTER — Ambulatory Visit (INDEPENDENT_AMBULATORY_CARE_PROVIDER_SITE_OTHER): Payer: Managed Care, Other (non HMO) | Admitting: Psychology

## 2016-04-22 DIAGNOSIS — F411 Generalized anxiety disorder: Secondary | ICD-10-CM

## 2016-04-22 NOTE — Progress Notes (Signed)
   THERAPIST PROGRESS NOTE  Session Time: 3.28pm-4pm  Participation Level: Active  Behavioral Response: Well GroomedAlertEuthymic  Type of Therapy: Individual Therapy  Treatment Goals addressed: Diagnosis: GAD and goal 1  Interventions: CBT and Supportive  Summary: Allison Bernard is a 20 y.o. female who presents with full and bright affect.  Pt reported that she has been doing well w/ her mood- not depressed, no overly anxious, not ruminating on worries and overall feeling more accepting on upcoming transition. Pt discussed how she has been reframing w/ negative anxious thoughts and acknowledging the support she has and ability to cope through things.  Pt discussed how things have settled at work and how she has been able to speak up for her needs and this was well received.  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  Pt reports that she will talk w/ Dr. Michae Kava next week about her intentions to breastfeed, whether to restart meds after delivery and if to which ones. Pt is currently 32 weeks pregant..   Suicidal/Homicidal: Nowithout intent/plan  Therapist Response: Assessed pt current functioning per pt report.  Processed w/pt her decreased anxiety and mood stablility and pt contributing factors.  Reflected good use of coping skills and reframing distortions as well as being assertive and seeking supports.   Plan: Return again in 4 weeks.  Diagnosis: GAD    ,, [facility] 04/22/2016

## 2016-04-27 ENCOUNTER — Encounter (HOSPITAL_COMMUNITY): Payer: Self-pay | Admitting: *Deleted

## 2016-04-27 ENCOUNTER — Inpatient Hospital Stay (HOSPITAL_COMMUNITY)
Admission: AD | Admit: 2016-04-27 | Discharge: 2016-04-28 | Disposition: A | Discharge status: Home / Self Care | Payer: Managed Care, Other (non HMO) | Source: Ambulatory Visit | Attending: Obstetrics and Gynecology | Admitting: Obstetrics and Gynecology

## 2016-04-27 DIAGNOSIS — Z3A33 33 weeks gestation of pregnancy: Secondary | ICD-10-CM

## 2016-04-27 DIAGNOSIS — Z3493 Encounter for supervision of normal pregnancy, unspecified, third trimester: Secondary | ICD-10-CM | POA: Diagnosis present

## 2016-04-27 DIAGNOSIS — E876 Hypokalemia: Secondary | ICD-10-CM

## 2016-04-27 DIAGNOSIS — R42 Dizziness and giddiness: Secondary | ICD-10-CM

## 2016-04-27 LAB — URINALYSIS, ROUTINE W REFLEX MICROSCOPIC
Bilirubin Urine: NEGATIVE
Glucose, UA: NEGATIVE mg/dL
KETONES UR: 15 mg/dL — AB
NITRITE: NEGATIVE
PH: 6 (ref 5.0–8.0)
PROTEIN: NEGATIVE mg/dL
Specific Gravity, Urine: 1.02 (ref 1.005–1.030)

## 2016-04-27 LAB — COMPREHENSIVE METABOLIC PANEL
ALT: 9 U/L — ABNORMAL LOW (ref 14–54)
AST: 22 U/L (ref 15–41)
Albumin: 3 g/dL — ABNORMAL LOW (ref 3.5–5.0)
Alkaline Phosphatase: 88 U/L (ref 38–126)
Anion gap: 6 (ref 5–15)
BUN: <5 mg/dL — ABNORMAL LOW (ref 6–20)
CHLORIDE: 106 mmol/L (ref 101–111)
CO2: 23 mmol/L (ref 22–32)
Calcium: 8.5 mg/dL — ABNORMAL LOW (ref 8.9–10.3)
Creatinine, Ser: 0.6 mg/dL (ref 0.44–1.00)
Glucose, Bld: 104 mg/dL — ABNORMAL HIGH (ref 65–99)
POTASSIUM: 2.7 mmol/L — AB (ref 3.5–5.1)
Sodium: 135 mmol/L (ref 135–145)
Total Bilirubin: 0.4 mg/dL (ref 0.3–1.2)
Total Protein: 6.1 g/dL — ABNORMAL LOW (ref 6.5–8.1)

## 2016-04-27 LAB — CBC
HCT: 30.9 % — ABNORMAL LOW (ref 36.0–46.0)
Hemoglobin: 10.6 g/dL — ABNORMAL LOW (ref 12.0–15.0)
MCH: 28.7 pg (ref 26.0–34.0)
MCHC: 34.3 g/dL (ref 30.0–36.0)
MCV: 83.7 fL (ref 78.0–100.0)
PLATELETS: 206 10*3/uL (ref 150–400)
RBC: 3.69 MIL/uL — ABNORMAL LOW (ref 3.87–5.11)
RDW: 12.6 % (ref 11.5–15.5)
WBC: 15.5 10*3/uL — AB (ref 4.0–10.5)

## 2016-04-27 LAB — URINE MICROSCOPIC-ADD ON

## 2016-04-27 MED ORDER — SODIUM CHLORIDE 0.9 % IV SOLN
Freq: Once | INTRAVENOUS | Status: AC
  Administered 2016-04-27: 23:00:00 via INTRAVENOUS
  Filled 2016-04-27: qty 1000

## 2016-04-27 MED ORDER — POTASSIUM CHLORIDE IN NACL 40-0.9 MEQ/L-% IV SOLN: INTRAVENOUS | Status: DC

## 2016-04-27 NOTE — MAU Provider Note (Signed)
TOCO : 135, moderate variability, +accels, neg decels, cat 1

## 2016-04-27 NOTE — MAU Note (Signed)
Pt presents complaining of a rapid heart beat, hand numbness and feeling disoriented at work. 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 Symptoms have since resolved. Denies vaginal bleeding or discharge. Denies pain. Reports good fetal movement.

## 2016-04-27 NOTE — MAU Provider Note (Signed)
History     CSN: [id number]  Arrival date and time: 04/27/16 2000   First Provider Initiated Contact with Patient 04/27/16 2038      Chief Complaint  Patient presents with  . Dizziness  . Numbness   Allison Bernard is a 20 y.o. G2P0010 at [redacted]w[redacted]d who presents today with dizziness and and feeling "disoriented" at work today. She states that she felt that way several times throughout the day, and then the last episode lasted longer. She denies any headache today. She states that in the last week her blood pressure has been low and she usually has blurry vision when that happens. She denies any complications with the pregnancy. She denies any VB, LOF or contractions. She reports normal fetal movement. She states that she had issues with PVCs and PACs earlier in her pregnancy, and had to wear a halter monitor.     Past Medical History:  Diagnosis Date  . ADD (attention deficit disorder)   . Anemia   . Anxiety   . Depression    no meds, doing ok  . Ectopic pregnancy   . Ectopic pregnancy    received methotrexate  . Headache(784.0)    had migraines when she was younger  . Infection    UTI  . Kidney infection   . 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 Oppositional defiant disorder   . PAC (premature atrial contraction) 01/29/2016  . Palpitations 01/03/2016  . Pregnant   . PVC (premature ventricular contraction) 01/29/2016    Past Surgical History:  Procedure Laterality Date  . ANKLE SURGERY Left    reconstruction   . MASS EXCISION Left 07/04/2013   Procedure: EXCISION MASS;  Surgeon: Nadara Mustard, MD;  Location: [facility] OR;  Service: Orthopedics;  Laterality: Left;  Excision Left Foot Talo-calcaneous fibrous bar  . tubes in ears     in the past  . WISDOM TOOTH EXTRACTION      Family History  Problem Relation Age of Onset  . Depression Mother   . Anxiety disorder Mother   . Hypertension Father   . Diabetes Father   . Heart failure Maternal Grandmother   . Hypertension Maternal Grandmother   .  Cancer Maternal Grandmother     4 types  . Hypertension Maternal Grandfather   . Mesothelioma Maternal Grandfather   . Cancer Paternal Grandmother     uterine    Social History  Substance Use Topics  . Smoking status: Former Smoker    Quit date: 09/28/2015  . Smokeless tobacco: Never Used     Comment: quit prior to preg  . Alcohol use No     Comment: 1 drink a month    Allergies: No Known Allergies  Prescriptions Prior to Admission  Medication Sig Dispense Refill Last Dose  . Doxylamine-Pyridoxine (DICLEGIS) 10-10 MG TBEC Take by mouth. Reported on 02/25/2016   Not Taking  . Prenatal Vit-Fe Fumarate-FA (PRENATAL MULTIVITAMIN) TABS tablet Take 1 tablet by mouth daily at 12 noon.   Taking    Review of Systems  Constitutional: Negative for chills and fever.  Gastrointestinal: Positive for nausea. Negative for abdominal pain, constipation, diarrhea and vomiting.  Genitourinary: Negative for dysuria, frequency and urgency.  Neurological: Positive for dizziness and tingling (in hands only ). Negative for loss of consciousness.   Physical Exam   Blood pressure 113/77, pulse 110, temperature 98.3 F (36.8 C), temperature source Oral, resp. rate 18, last menstrual period 03/10/2015, unknown if currently breastfeeding.  Physical Exam  Nursing note and vitals reviewed. Constitutional: She is oriented to person, place, and time. She appears well-developed and well-nourished.  HENT:  Head: Normocephalic.  Cardiovascular: Normal rate.   Respiratory: Effort normal.  GI: Soft. There is no tenderness. There is no rebound.  Neurological: She is alert and oriented to person, place, and time.  Skin: Skin is warm and dry.   FHT 140, moderate with 15x15 accels, no decels Toco: no UCs  MAU Course  Procedures  MDM 2211: D/W Dr. Mindi Slicker, will give 59 MeQ of potassium here, and then can be DC home Does not need PO replacement at home. FU in the office by the end of the week.   Patient has  had 1L of NS with 40 MeQ of potassium.    Assessment and Plan   1. Hypokalemia   2. [redacted] weeks gestation of pregnancy   3. Dizziness    DC home Comfort measures reviewed  3rd Trimester precautions  PTL precautions  Fetal kick counts RX: none  Return to MAU as needed FU with OB as planned  Follow-up Information    [facility], DO .   Specialty:  Obstetrics and Gynecology Contact information: [address] [place] [address] [place] [address] [phone]            [name] 04/27/2016, 8:40 PM

## 2016-04-28 DIAGNOSIS — Z3493 Encounter for supervision of normal pregnancy, unspecified, third trimester: Secondary | ICD-10-CM | POA: Diagnosis not present

## 2016-04-28 DIAGNOSIS — E876 Hypokalemia: Secondary | ICD-10-CM

## 2016-04-28 DIAGNOSIS — Z3A33 33 weeks gestation of pregnancy: Secondary | ICD-10-CM | POA: Diagnosis not present

## 2016-04-28 DIAGNOSIS — R42 Dizziness and giddiness: Secondary | ICD-10-CM | POA: Diagnosis not present

## 2016-04-28 NOTE — Discharge Instructions (Signed)

## 2016-04-30 ENCOUNTER — Encounter (HOSPITAL_COMMUNITY): Payer: Self-pay | Admitting: Psychiatry

## 2016-04-30 ENCOUNTER — Ambulatory Visit (INDEPENDENT_AMBULATORY_CARE_PROVIDER_SITE_OTHER): Payer: Managed Care, Other (non HMO) | Admitting: Psychiatry

## 2016-04-30 VITALS — BP 110/68 | HR 104 | Ht 63.0 in | Wt 149.0 lb

## 2016-04-30 DIAGNOSIS — F410 Panic disorder [episodic paroxysmal anxiety] without agoraphobia: Secondary | ICD-10-CM

## 2016-04-30 DIAGNOSIS — F900 Attention-deficit hyperactivity disorder, predominantly inattentive type: Secondary | ICD-10-CM

## 2016-04-30 DIAGNOSIS — F331 Major depressive disorder, recurrent, moderate: Secondary | ICD-10-CM | POA: Diagnosis not present

## 2016-04-30 NOTE — Progress Notes (Signed)
Patient ID: [name], female   DOB: [birth year], 20 y.o.   MRN: [id number]  [facility] MD/PA/NP OP Progress Note  04/30/2016 9:19 AM Allison Bernard  MRN:  [id number]  Subjective:   Pt is pregnant and is due Sept 17. Pt is working with her OB and see's them ever 2 weeks. Pt states the second trimester is better and she is no longer having N/V. Pt was dehydrated and went to the ED for treatment.   Pt stopped all her meds at [redacted] weeks pregnant.   Pt is going to all doctor and therapy appointments. Pt saw a cardiologist for her chest pain. Pt reports she has a pregnancy associated arrhthymias.   Pt is working at [employer]. Pt is happy with it.   Depression has improved recently. Situational stressors really affect her mood. Pt has moved and is living with her boyfriend.  Pt is having a lot of low motivation but it is slowly improving. Mood improves when she is with people. Reports some isolation and anhedonia are improving. Denies worthlessness and hopelessness. Denies panic attacks.  Denies SI/HI.  Pt is eating ok. Sleep is ok and she is getting about 8 hrs/night.  Energy is on the low side due to pregnancy.   Concentration is good. Denies any current symptoms of ADD.  Irritability is present when tired or stressed. Pt reports she is having a low frustration tolerance. Denies AVH. Denies grandiouse thougths and ideas of reference and compulsions.   Overall pt feels she is improved. She does not want to start any meds today.    Chief Complaint: going ok Chief Complaint    Follow-up     Visit Diagnosis:     ICD-9-CM ICD-10-CM   1. MDD (major depressive disorder), recurrent episode, moderate (HCC) 296.32 F33.1   2. Panic disorder without agoraphobia 300.01 F41.0   3. Attention deficit hyperactivity disorder (ADHD), predominantly inattentive type 314.01 F90.0     Past Medical History:  Past Medical History:  Diagnosis Date  . ADD (attention deficit disorder)   . Anemia   .  Anxiety   . Depression    no meds, doing ok  . Ectopic pregnancy   . Ectopic pregnancy    received methotrexate  . Headache(784.0)    had migraines when she was younger  . Infection    UTI  . Kidney infection   . 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 Oppositional defiant disorder   . PAC (premature atrial contraction) 01/29/2016  . Palpitations 01/03/2016  . Pregnant   . PVC (premature ventricular contraction) 01/29/2016    Past Surgical History:  Procedure Laterality Date  . ANKLE SURGERY Left    reconstruction   . MASS EXCISION Left 07/04/2013   Procedure: EXCISION MASS;  Surgeon: Nadara Mustard, MD;  [facility] OR;  Service: Orthopedics;  Laterality: Left;  Excision Left Foot Talo-calcaneous fibrous bar  . tubes in ears     in the past  . WISDOM TOOTH EXTRACTION     Family History:  Family History  Problem Relation Age of Onset  . Depression Mother   . Anxiety disorder Mother   . Hypertension Father   . Diabetes Father   . Heart failure Maternal Grandmother   . Hypertension Maternal Grandmother   . Cancer Maternal Grandmother     4 types  . Hypertension Maternal Grandfather   . Mesothelioma Maternal Grandfather   . Cancer Paternal Grandmother     uterine   Social History:  Social History   Social History  . Marital status: Single    Spouse name: N/A  . Number of children: N/A  . Years of education: N/A   Social History Main Topics  . Smoking status: Former Smoker    Quit date: 09/28/2015  . Smokeless tobacco: Never Used     Comment: quit prior to preg  . Alcohol use No     Comment: 1 drink a month  . Drug use: No  . Sexual activity: Yes    Birth control/ protection: Pill   Other Topics Concern  . None   Social History Narrative  . None   Additional History: She lives on her parents property. Reports that she has never smoked. She has never used smokeless tobacco. She reports that she drinks alcohol. She reports that she does not use illicit drugs.   Past Psychiatric  History/Hospitalization(s): Anxiety: Yes Bipolar Disorder: No Depression: Yes Mania: No Psychosis: No Schizophrenia: No Personality Disorder: No Hospitalization for psychiatric illness: No History of Electroconvulsive Shock Therapy: No Prior Suicide Attempts: No   Musculoskeletal: Strength & Muscle Tone: within normal limits Gait & Station: normal Patient leans: straight  Psychiatric Specialty Exam: HPI  Review of Systems  Constitutional: Negative for chills, fever and weight loss.  HENT: Negative for congestion, hearing loss and sore throat.   Eyes: Negative for blurred vision, double vision and redness.  Respiratory: Negative for cough, shortness of breath and wheezing.   Cardiovascular: Positive for palpitations. Negative for chest pain and leg swelling.  Gastrointestinal: Positive for abdominal pain. Negative for heartburn, nausea and vomiting.  Musculoskeletal: Positive for back pain and joint pain. Negative for neck pain.  Skin: Negative for itching and rash.  Neurological: Negative for dizziness, seizures, loss of consciousness and headaches.  Psychiatric/Behavioral: Positive for depression. Negative for hallucinations, substance abuse and suicidal ideas. The patient is nervous/anxious. The patient does not have insomnia.     Blood pressure 110/68, pulse (!) 104, height 5\' 3"  (1.6 m), weight 149 lb (67.6 kg), last menstrual period 03/10/2015, unknown if currently breastfeeding.Body mass index is 26.39 kg/m.  General Appearance: Fairly Groomed  Eye Contact:  Good  Speech:  Clear and Coherent and Normal Rate  Volume:  Normal  Mood:  Depressed  Affect:  Full Range  Thought Process:  Goal Directed, Linear and Logical  Orientation:  Full (Time, Place, and Person)  Thought Content:  Negative  Suicidal Thoughts:  No  Homicidal Thoughts:  No  Memory:  Immediate;   Good Recent;   Good Remote;   Good  Judgement:  Good  Insight:  Good  Psychomotor Activity:  Normal   Concentration:  Good  Recall:  Good  Fund of Knowledge: Good  Language: Good  Akathisia:  No  Handed:  Right  AIMS (if indicated):  n/a  Assets:  Communication Skills Desire for Improvement Housing Intimacy Leisure Time Physical Health Resilience Social Support Talents/Skills Transportation Vocational/Educational  ADL's:  Intact  Cognition: WNL  Sleep:  good   Is the patient at risk to self?  No. Has the patient been a risk to self in the past 6 months?  No. Has the patient been a risk to self within the distant past?  No. Is the patient a risk to others?  No. Has the patient been a risk to others in the past 6 months?  No. Has the patient been a risk to others within the distant past?  No.  Current Medications: Current Outpatient Prescriptions  Medication Sig Dispense Refill  . acetaminophen (TYLENOL) 325 MG tablet Take 650 mg by mouth every 6 (six) hours as needed for moderate pain.    . calcium carbonate (TUMS - DOSED IN MG ELEMENTAL CALCIUM) 500 MG chewable tablet Chew 1 tablet by mouth 4 (four) times daily as needed for indigestion or heartburn.    . Prenatal Vit-Fe Fumarate-FA (PRENATAL MULTIVITAMIN) TABS tablet Take 1 tablet by mouth daily at 12 noon.     No current facility-administered medications for this visit.      Treatment Plan Summary:Medication management and Plan see below  Axis I: Major depressive disorder- recurrent, moderate; Panic disorder without agorophobia; ADHD inattentive type moderate severity  Axis II: Deferred   Plan:  -discussed Wellbutrin but pt feels she is not ready to take meds Pt is pregnant and is not actively taking meds. We discussed teratogenic risks and pt Verbalized understanding and opted not to take meds at this time. She will inform us if mood changes.  Medication management with supportive therapy. Risks/benefits and SE of the medication discussed. Pt verbalized understanding and verbal consent obtained for  treatment. Affirm with the patient that the medications are taken as ordered. Patient expressed understanding of how their medications were to be used.    Labs: reviewed labs 03/30/2015 CBC WNL  Therapy: brief supportive therapy provided. Discussed psychosocial stressors in detail.  Encouraged to restart individual therapy Declined IOP due to work  Pt denies SI and is at an acute low risk for suicide.Patient told to call clinic if any problems occur. Patient advised to go to ER if they should develop SI/HI, side effects, or if symptoms worsen. Has crisis numbers to call if needed. Pt verbalized understanding.  F/up in 2 months or sooner if needed           Oletta Darter 04/30/2016, 9:19 AM

## 2016-05-05 ENCOUNTER — Telehealth: Payer: Self-pay | Admitting: Cardiovascular Disease

## 2016-05-05 NOTE — Telephone Encounter (Signed)
Pt of Dr. Duke Salviaandolph. Seen earlier this year for evaluation of palps (48 holter w result of PACs/PVCs, sinus arrythmia) currently [redacted] weeks gestation   Pt states she has had issues w BP dropping. she notes it was low, around 87/54 today. she felt correction needed and drank water.  She notes recurrence of low BPs, she usually tries to get BP up by drinking water (1-2 bottles) and elevating legs. Notes this helps but that often afterwards, for about 5-10 mins feels like heart is racing, then will go down to normal rate.  when the BP is low, she denies any syncope, dizziness, confusion, etc. However, she also states that [facility] bc she had issue of near syncope, hands numb, cramping & unsure about possibility of early labor, etc - noted bloodwork done, She had low Hgb and low K which was corrected in hospital via IV meds. She was sent home same-day.  patient also notes some SOB, consistent all the time, not worse or better w/ activity, but more noticeable now than what she has been experiencing - cites this as ongoing concern. notes worse when she feels she is having BP or HR issues. States this is not causing her any limitation or acute distress.  Have given pt instruction not to worry about BPs being borderline low unless symptoms. she states for the most part, with exception of yesterday, she has been asymptomatic. Encouraged fluids. she may do fine w adding sports drinks in moderation. Regarding SOB, she is aware a component of this could be the progression of her pregnancy, pressure on diaphragm, etc. She is aware I will route for physician advice, and see if Dr. Duke Salviaandolph recommends her to be reseen by cardiology. Aware she also has option to call OBGYN or [facility] for urgent issues.

## 2016-05-05 NOTE — Telephone Encounter (Signed)
New message       Pt c/o BP issue: STAT if pt c/o blurred vision, one-sided weakness or slurred speech  1. What are your last 5 BP readings? 87/54, 89/61, 95/62 2. Are you having any other symptoms (ex. Dizziness, headache, blurred vision, passed out)? no  3. What is your BP issue? Pt is [redacted]wks pregnant.  She states that as long as her bp is low 87/54 or 89/61, her heart rate is between 80-90.  When her bp is higher, 95/62, her heart rate shoots up between 116-124. Please advise

## 2016-05-12 LAB — OB RESULTS CONSOLE GBS: STREP GROUP B AG: NEGATIVE

## 2016-05-12 NOTE — Telephone Encounter (Signed)
I agree that unless she is having symptoms I wouldn't worry much about the number.  It is normal for your blood pressure to be a little lower in pregnancy.  Treatment options are limited while she is pregnant.  The medicines to treat her palpitations would cause her blood pressure to go lower, which would not be safe.  There are medicines that we could give to raise her blood pressure, but these are not advisable in pregnancy unless absolutely necessary. I recommended that she increase the salt in her diet as well as the fluids. She should also wear compression stockings.  Please check a basic metabolic panel to ensure that her potassium is no longer low.

## 2016-05-14 NOTE — Telephone Encounter (Signed)
Left message to call back  

## 2016-05-18 ENCOUNTER — Ambulatory Visit (HOSPITAL_COMMUNITY): Payer: Self-pay | Admitting: Psychology

## 2016-06-02 ENCOUNTER — Institutional Professional Consult (permissible substitution): Payer: Self-pay | Admitting: Pediatrics

## 2016-06-08 ENCOUNTER — Telehealth (HOSPITAL_COMMUNITY): Payer: Self-pay | Admitting: *Deleted

## 2016-06-08 ENCOUNTER — Encounter (HOSPITAL_COMMUNITY): Payer: Self-pay | Admitting: *Deleted

## 2016-06-08 ENCOUNTER — Ambulatory Visit (INDEPENDENT_AMBULATORY_CARE_PROVIDER_SITE_OTHER): Payer: Self-pay | Admitting: Pediatrics

## 2016-06-08 DIAGNOSIS — Z7681 Expectant parent(s) prebirth pediatrician visit: Secondary | ICD-10-CM

## 2016-06-08 DIAGNOSIS — Z3490 Encounter for supervision of normal pregnancy, unspecified, unspecified trimester: Secondary | ICD-10-CM

## 2016-06-08 NOTE — Telephone Encounter (Signed)
Preadmission screen  

## 2016-06-09 ENCOUNTER — Encounter (HOSPITAL_COMMUNITY): Payer: Self-pay

## 2016-06-09 ENCOUNTER — Ambulatory Visit (HOSPITAL_COMMUNITY): Payer: Self-pay | Admitting: Psychology

## 2016-06-09 DIAGNOSIS — Z3493 Encounter for supervision of normal pregnancy, unspecified, third trimester: Secondary | ICD-10-CM

## 2016-06-09 HISTORY — DX: Encounter for supervision of normal pregnancy, unspecified, third trimester: Z34.93

## 2016-06-09 NOTE — H&P (Signed)
Allison Bernard is a 20 y.o. female G2P0010 at 39+ for IOL secondary to term and favorable cervix.  PNC complicated by heart palpitations - eval by cardiology, cleared.  Dated by LMP c/w first trimester [place].  H/o Ectopic - treated with methotrexate.  Received tamiflu in early preg, for likely flu.   . OB History    Gravida Para Term Preterm AB Living   2       1     SAB TAB Ectopic Multiple Live Births       1 0      G1 ectopic, treated with MTX G2 present   No abn pap No STD Past Medical History:  Diagnosis Date  . ADD (attention deficit disorder)   . Anemia   . Anxiety   . Depression    no meds, doing ok  . Ectopic pregnancy   . Ectopic pregnancy    received methotrexate  . Headache(784.0)    had migraines when she was younger  . Infection    UTI  . Kidney infection   . 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 Normal pregnancy in third trimester 06/09/2016  . Oppositional defiant disorder   . PAC (premature atrial contraction) 01/29/2016  . Palpitations 01/03/2016  . Pregnant   . PVC (premature ventricular contraction) 01/29/2016   Past Surgical History:  Procedure Laterality Date  . ANKLE SURGERY Left    reconstruction   . MASS EXCISION Left 07/04/2013   Procedure: EXCISION MASS;  Surgeon: Nadara Mustard, MD;  Location: [facility] OR;  Service: Orthopedics;  Laterality: Left;  Excision Left Foot Talo-calcaneous fibrous bar  . tubes in ears     in the past  . WISDOM TOOTH EXTRACTION     Family History: family history includes Anxiety disorder in her mother; Cancer in her maternal grandmother and paternal grandmother; Depression in her mother; Diabetes in her father; Heart failure in her maternal grandmother; Hypertension in her father, maternal grandfather, and maternal grandmother; Mesothelioma in her maternal grandfather. Social History:  reports that she quit smoking about 8 months ago. She has never used smokeless tobacco. She reports that she does not drink alcohol or use drugs.  Meds PNV All  NKDA     Maternal Diabetes: No Genetic Screening: Normal Maternal Ultrasounds/Referrals: Normal Fetal Ultrasounds or other Referrals:  None Maternal Substance Abuse:  No Significant Maternal Medications:  None Significant Maternal Lab Results:  Lab values include: Group B Strep negative Other Comments:  None  Review of Systems  Constitutional: Negative.   HENT: Negative.   Eyes: Negative.   Respiratory: Negative.   Cardiovascular: Negative.   Gastrointestinal: Negative.   Genitourinary: Negative.   Musculoskeletal: Positive for back pain.  Skin: Negative.   Neurological: Negative.   Psychiatric/Behavioral: Negative.    Maternal Medical History:  Contractions: Frequency: irregular.    Fetal activity: Perceived fetal activity is normal.    Prenatal Complications - Diabetes: none.      Last menstrual period 03/10/2015, unknown if currently breastfeeding. Maternal Exam:  Abdomen: Patient reports no abdominal tenderness. Fundal height is appropriate for gestation.   Estimated fetal weight is 7#.   Fetal presentation: vertex  Introitus: Normal vulva. Normal vagina.  Cervix: Cervix evaluated by digital exam.     Physical Exam  Constitutional: She is oriented to person, place, and time. She appears well-developed and well-nourished.  HENT:  Head: Normocephalic and atraumatic.  Cardiovascular: Normal rate and regular rhythm.   Respiratory: Effort normal and breath sounds normal.  No respiratory distress. She has no wheezes.  GI: Soft. Bowel sounds are normal. She exhibits no distension. There is no tenderness.  Musculoskeletal: Normal range of motion.  Neurological: She is alert and oriented to person, place, and time.  Skin: Skin is warm and dry.  Psychiatric: She has a normal mood and affect. Her behavior is normal.    Prenatal labs: ABO, Rh:  O+  Antibody:  neg Rubella:  immune RPR:   NR HBsAg:   neg HIV:   neg GBS:   neg  Hgb 14.0/Plt 295K/Ur Cx neg/GC neg/  Chl neg/Varicella immune/TSH WNL/glucoa 92/nl first trimester US, nl NT, nl AFP  Tdap 04/26/16 Nl anat, post fundal plac, female SVE 2/50/-2 Assessment/Plan: 20yo G2P0010 at 39+ for IOL Epidural/Stadol/Nitrous prn gbbs neg - no prophylaxis Pitocin and AROM to augment Expect SVD   Bovard-Stuckert,  06/09/2016, 1:11 PM

## 2016-06-09 NOTE — Progress Notes (Signed)
Prenatal counseling for impending newborn done-- Z76.81   Allison Bernard  D.O.

## 2016-06-10 ENCOUNTER — Inpatient Hospital Stay (HOSPITAL_COMMUNITY)
Admission: RE | Admit: 2016-06-10 | Discharge: 2016-06-13 | DRG: 774 | Disposition: A | Discharge status: Home / Self Care | Payer: Managed Care, Other (non HMO) | Source: Ambulatory Visit | Attending: Obstetrics and Gynecology | Admitting: Obstetrics and Gynecology

## 2016-06-10 ENCOUNTER — Encounter (HOSPITAL_COMMUNITY): Payer: Self-pay

## 2016-06-10 ENCOUNTER — Inpatient Hospital Stay (HOSPITAL_COMMUNITY): Payer: Managed Care, Other (non HMO) | Admitting: Anesthesiology

## 2016-06-10 ENCOUNTER — Institutional Professional Consult (permissible substitution): Payer: Self-pay | Admitting: Pediatrics

## 2016-06-10 DIAGNOSIS — Z833 Family history of diabetes mellitus: Secondary | ICD-10-CM

## 2016-06-10 DIAGNOSIS — Z3A39 39 weeks gestation of pregnancy: Secondary | ICD-10-CM | POA: Diagnosis not present

## 2016-06-10 DIAGNOSIS — D649 Anemia, unspecified: Secondary | ICD-10-CM | POA: Diagnosis present

## 2016-06-10 DIAGNOSIS — Z8249 Family history of ischemic heart disease and other diseases of the circulatory system: Secondary | ICD-10-CM | POA: Diagnosis not present

## 2016-06-10 DIAGNOSIS — Z87891 Personal history of nicotine dependence: Secondary | ICD-10-CM | POA: Diagnosis not present

## 2016-06-10 DIAGNOSIS — O9902 Anemia complicating childbirth: Secondary | ICD-10-CM | POA: Diagnosis present

## 2016-06-10 DIAGNOSIS — Z3403 Encounter for supervision of normal first pregnancy, third trimester: Secondary | ICD-10-CM | POA: Diagnosis present

## 2016-06-10 DIAGNOSIS — Z3493 Encounter for supervision of normal pregnancy, unspecified, third trimester: Secondary | ICD-10-CM

## 2016-06-10 DIAGNOSIS — Z3490 Encounter for supervision of normal pregnancy, unspecified, unspecified trimester: Secondary | ICD-10-CM

## 2016-06-10 HISTORY — DX: Encounter for supervision of normal pregnancy, unspecified, third trimester: Z34.93

## 2016-06-10 LAB — CBC
HEMATOCRIT: 32 % — AB (ref 36.0–46.0)
HEMOGLOBIN: 11.1 g/dL — AB (ref 12.0–15.0)
MCH: 28.2 pg (ref 26.0–34.0)
MCHC: 34.7 g/dL (ref 30.0–36.0)
MCV: 81.4 fL (ref 78.0–100.0)
Platelets: 191 10*3/uL (ref 150–400)
RBC: 3.93 MIL/uL (ref 3.87–5.11)
RDW: 13 % (ref 11.5–15.5)
WBC: 13 10*3/uL — AB (ref 4.0–10.5)

## 2016-06-10 LAB — TYPE AND SCREEN
ABO/RH(D): O POS
ANTIBODY SCREEN: NEGATIVE

## 2016-06-10 MED ORDER — IBUPROFEN 600 MG PO TABS
600.0000 mg | ORAL_TABLET | Freq: Four times a day (QID) | ORAL | Status: DC
  Administered 2016-06-10 – 2016-06-13 (×10): 600 mg via ORAL
  Filled 2016-06-10 (×10): qty 1

## 2016-06-10 MED ORDER — BUTORPHANOL TARTRATE 1 MG/ML IJ SOLN
1.0000 mg | INTRAMUSCULAR | Status: DC | PRN
  Administered 2016-06-10: 1 mg via INTRAVENOUS
  Filled 2016-06-10: qty 1

## 2016-06-10 MED ORDER — BENZOCAINE-MENTHOL 20-0.5 % EX AERO
1.0000 "application " | INHALATION_SPRAY | CUTANEOUS | Status: DC | PRN
  Administered 2016-06-11: 1 via TOPICAL
  Filled 2016-06-10: qty 56

## 2016-06-10 MED ORDER — PHENYLEPHRINE 40 MCG/ML (10ML) SYRINGE FOR IV PUSH (FOR BLOOD PRESSURE SUPPORT)
80.0000 ug | PREFILLED_SYRINGE | INTRAVENOUS | Status: DC | PRN
  Filled 2016-06-10: qty 5
  Filled 2016-06-10: qty 10

## 2016-06-10 MED ORDER — LACTATED RINGERS IV SOLN
INTRAVENOUS | Status: DC
Start: 2016-06-10 — End: 2016-06-14

## 2016-06-10 MED ORDER — FENTANYL 2.5 MCG/ML BUPIVACAINE 1/10 % EPIDURAL INFUSION (WH - ANES)
14.0000 mL/h | INTRAMUSCULAR | Status: DC | PRN
  Administered 2016-06-10: 14 mL/h via EPIDURAL
  Filled 2016-06-10: qty 125

## 2016-06-10 MED ORDER — LACTATED RINGERS IV SOLN
INTRAVENOUS | Status: DC
  Administered 2016-06-10 (×3): via INTRAVENOUS

## 2016-06-10 MED ORDER — SIMETHICONE 80 MG PO CHEW: 80.0000 mg | CHEWABLE_TABLET | ORAL | Status: DC | PRN

## 2016-06-10 MED ORDER — PRENATAL MULTIVITAMIN CH
1.0000 | ORAL_TABLET | Freq: Every day | ORAL | Status: DC
  Administered 2016-06-12: 1 via ORAL
  Filled 2016-06-10 (×2): qty 1

## 2016-06-10 MED ORDER — SENNOSIDES-DOCUSATE SODIUM 8.6-50 MG PO TABS
2.0000 | ORAL_TABLET | ORAL | Status: DC
  Administered 2016-06-10 – 2016-06-13 (×3): 2 via ORAL
  Filled 2016-06-10 (×3): qty 2

## 2016-06-10 MED ORDER — LACTATED RINGERS IV SOLN
500.0000 mL | INTRAVENOUS | Status: DC | PRN
  Administered 2016-06-10: 500 mL via INTRAVENOUS

## 2016-06-10 MED ORDER — OXYTOCIN BOLUS FROM INFUSION: 500.0000 mL | Freq: Once | INTRAVENOUS | Status: DC

## 2016-06-10 MED ORDER — LIDOCAINE HCL (PF) 1 % IJ SOLN
30.0000 mL | INTRAMUSCULAR | Status: DC | PRN
  Filled 2016-06-10: qty 30

## 2016-06-10 MED ORDER — ONDANSETRON HCL 4 MG/2ML IJ SOLN: 4.0000 mg | INTRAMUSCULAR | Status: DC | PRN

## 2016-06-10 MED ORDER — LIDOCAINE HCL (PF) 1 % IJ SOLN
INTRAMUSCULAR | Status: DC | PRN
  Administered 2016-06-10 (×2): 7 mL via EPIDURAL

## 2016-06-10 MED ORDER — TETANUS-DIPHTH-ACELL PERTUSSIS 5-2.5-18.5 LF-MCG/0.5 IM SUSP: 0.5000 mL | Freq: Once | INTRAMUSCULAR | Status: DC

## 2016-06-10 MED ORDER — EPHEDRINE 5 MG/ML INJ
10.0000 mg | INTRAVENOUS | Status: DC | PRN
  Filled 2016-06-10: qty 4

## 2016-06-10 MED ORDER — DIPHENHYDRAMINE HCL 25 MG PO CAPS: 25.0000 mg | ORAL_CAPSULE | Freq: Four times a day (QID) | ORAL | Status: DC | PRN

## 2016-06-10 MED ORDER — LACTATED RINGERS IV SOLN: 500.0000 mL | Freq: Once | INTRAVENOUS | Status: DC

## 2016-06-10 MED ORDER — ZOLPIDEM TARTRATE 5 MG PO TABS: 5.0000 mg | ORAL_TABLET | Freq: Every evening | ORAL | Status: DC | PRN

## 2016-06-10 MED ORDER — ACETAMINOPHEN 325 MG PO TABS
650.0000 mg | ORAL_TABLET | ORAL | Status: DC | PRN
  Administered 2016-06-11 (×3): 650 mg via ORAL

## 2016-06-10 MED ORDER — CALCIUM CARBONATE ANTACID 500 MG PO CHEW: 1.0000 | CHEWABLE_TABLET | Freq: Four times a day (QID) | ORAL | Status: DC | PRN

## 2016-06-10 MED ORDER — COCONUT OIL OIL
1.0000 "application " | TOPICAL_OIL | Status: DC | PRN
  Administered 2016-06-11: 1 via TOPICAL
  Filled 2016-06-10: qty 120

## 2016-06-10 MED ORDER — ONDANSETRON HCL 4 MG PO TABS: 4.0000 mg | ORAL_TABLET | ORAL | Status: DC | PRN

## 2016-06-10 MED ORDER — OXYTOCIN 40 UNITS IN LACTATED RINGERS INFUSION - SIMPLE MED
1.0000 m[IU]/min | INTRAVENOUS | Status: DC
  Administered 2016-06-10: 2 m[IU]/min via INTRAVENOUS
  Filled 2016-06-10: qty 1000

## 2016-06-10 MED ORDER — DIBUCAINE 1 % RE OINT: 1.0000 "application " | TOPICAL_OINTMENT | RECTAL | Status: DC | PRN

## 2016-06-10 MED ORDER — WITCH HAZEL-GLYCERIN EX PADS: 1.0000 "application " | MEDICATED_PAD | CUTANEOUS | Status: DC | PRN

## 2016-06-10 MED ORDER — OXYCODONE-ACETAMINOPHEN 5-325 MG PO TABS
1.0000 | ORAL_TABLET | ORAL | Status: DC | PRN
  Filled 2016-06-10: qty 1

## 2016-06-10 MED ORDER — OXYCODONE HCL 5 MG PO TABS
10.0000 mg | ORAL_TABLET | ORAL | Status: DC | PRN
  Administered 2016-06-12: 10 mg via ORAL
  Filled 2016-06-10: qty 2

## 2016-06-10 MED ORDER — OXYCODONE-ACETAMINOPHEN 5-325 MG PO TABS: 2.0000 | ORAL_TABLET | ORAL | Status: DC | PRN

## 2016-06-10 MED ORDER — PHENYLEPHRINE 40 MCG/ML (10ML) SYRINGE FOR IV PUSH (FOR BLOOD PRESSURE SUPPORT)
80.0000 ug | PREFILLED_SYRINGE | INTRAVENOUS | Status: DC | PRN
Start: 2016-06-10 — End: 2016-06-12
  Administered 2016-06-10: 80 ug via INTRAVENOUS
  Filled 2016-06-10: qty 5

## 2016-06-10 MED ORDER — DIPHENHYDRAMINE HCL 50 MG/ML IJ SOLN: 12.5000 mg | INTRAMUSCULAR | Status: DC | PRN

## 2016-06-10 MED ORDER — TERBUTALINE SULFATE 1 MG/ML IJ SOLN
0.2500 mg | Freq: Once | INTRAMUSCULAR | Status: DC | PRN
  Filled 2016-06-10: qty 1

## 2016-06-10 MED ORDER — OXYCODONE HCL 5 MG PO TABS
5.0000 mg | ORAL_TABLET | ORAL | Status: DC | PRN
  Administered 2016-06-11 – 2016-06-13 (×6): 5 mg via ORAL
  Filled 2016-06-10 (×6): qty 1

## 2016-06-10 MED ORDER — ONDANSETRON HCL 4 MG/2ML IJ SOLN
4.0000 mg | Freq: Four times a day (QID) | INTRAMUSCULAR | Status: DC | PRN
Start: 2016-06-10 — End: 2016-06-12
  Administered 2016-06-10: 4 mg via INTRAVENOUS
  Filled 2016-06-10: qty 2

## 2016-06-10 MED ORDER — ACETAMINOPHEN 325 MG PO TABS
650.0000 mg | ORAL_TABLET | ORAL | Status: DC | PRN
  Filled 2016-06-10 (×4): qty 2

## 2016-06-10 MED ORDER — SOD CITRATE-CITRIC ACID 500-334 MG/5ML PO SOLN: 30.0000 mL | ORAL | Status: DC | PRN

## 2016-06-10 MED ORDER — OXYTOCIN 40 UNITS IN LACTATED RINGERS INFUSION - SIMPLE MED
2.5000 [IU]/h | INTRAVENOUS | Status: DC
  Administered 2016-06-11: 2.5 [IU]/h via INTRAVENOUS
  Filled 2016-06-10: qty 1000

## 2016-06-10 NOTE — Anesthesia Procedure Notes (Signed)
Epidural Patient location during procedure: OB Start time: 06/10/2016 1:06 PM End time: 06/10/2016 1:10 PM  Staffing Anesthesiologist: Leilani AbleHATCHETT,  Performed: anesthesiologist   Preanesthetic Checklist Completed: patient identified, surgical consent, pre-op evaluation, timeout performed, IV checked, risks and benefits discussed and monitors and equipment checked  Epidural Patient position: sitting Prep: site prepped and draped and DuraPrep Patient monitoring: continuous pulse ox and blood pressure Approach: midline Location: L3-L4 Injection technique: LOR air  Needle:  Needle type: Tuohy  Needle gauge: 17 G Needle length: 9 cm and 9 Needle insertion depth: 5 cm cm Catheter type: closed end flexible Catheter size: 19 Gauge Catheter at skin depth: 10 cm Test dose: negative and Other  Assessment Sensory level: T9 Events: blood not aspirated, injection not painful, no injection resistance, negative IV test and no paresthesia  Additional Notes Reason for block:procedure for pain

## 2016-06-10 NOTE — Progress Notes (Signed)
Dr Juanito DoomAgbuya notified of infants  intolence of room air.  Notified that oxyhood restarted at 30% and o2 sats returned to above 90%.  Infant has not feed-MD notified.  MD notified that scalp has an abrasion

## 2016-06-10 NOTE — Progress Notes (Signed)
Patient ID: Allison KocherLauren A Bernard, female   DOB: 02/07/1996, 20 y.o.   MRN: [id number]   Getting uncomfortable with ctx.  Waiting for epidural.  AFVSS gen NAD FHTs 120's, good var, category 1 toco Q 2-754min  SVE 4/80/0  Continue IOL, expect SVD

## 2016-06-10 NOTE — Anesthesia Pain Management Evaluation Note (Signed)
  CRNA Pain Management Visit Note  Patient: Allison KocherLauren A Burchfield, 20 y.o., female  "Hello I am a member of the anesthesia team at [facility]. We have an anesthesia team available at all times to provide care throughout the hospital, including epidural management and anesthesia for C-section. I don't know your plan for the delivery whether it a natural birth, water birth, IV sedation, nitrous supplementation, doula or epidural, but we want to meet your pain goals."   1.Was your pain managed to your expectations on prior hospitalizations?   No prior hospitalizations  2.What is your expectation for pain management during this hospitalization?     Epidural  3.How can we help you reach that goal? unsure  Record the patient's initial score and the patient's pain goal.   Pain: 6  Pain Goal: 10 [facility] wants you to be able to say your pain was always managed very well.  Laban EmperorMalinova, Hristova 06/10/2016

## 2016-06-10 NOTE — Progress Notes (Signed)
Patient ID: Allison KocherLauren A Bernard, female   DOB: 08/22/1996, 20 y.o.   MRN: [id number]   Comfortable with epidural  AFVSS gen NAD FHTs 130's, good var, category 1 toco Q672min  SVE 6.7/90/0-+1  Continue current mgmt

## 2016-06-10 NOTE — Anesthesia Preprocedure Evaluation (Signed)
Anesthesia Evaluation  Patient identified by MRN, date of birth, ID band Patient awake    Reviewed: Allergy & Precautions, H&P , NPO status , Patient's Chart, lab work & pertinent test results  Airway Mallampati: I  TM Distance: >3 FB Neck ROM: full    Dental no notable dental hx.    Pulmonary neg pulmonary ROS, former smoker,    Pulmonary exam normal        Cardiovascular negative cardio ROS Normal cardiovascular exam     Neuro/Psych    GI/Hepatic negative GI ROS, Neg liver ROS,   Endo/Other  negative endocrine ROS  Renal/GU      Musculoskeletal   Abdominal Normal abdominal exam  (+)   Peds  Hematology   Anesthesia Other Findings   Reproductive/Obstetrics (+) Pregnancy                             Anesthesia Physical Anesthesia Plan  ASA: II  Anesthesia Plan: Epidural   Post-op Pain Management:    Induction:   Airway Management Planned:   Additional Equipment:   Intra-op Plan:   Post-operative Plan:   Informed Consent: I have reviewed the patients History and Physical, chart, labs and discussed the procedure including the risks, benefits and alternatives for the proposed anesthesia with the patient or authorized representative who has indicated his/her understanding and acceptance.     Plan Discussed with:   Anesthesia Plan Comments:         Anesthesia Quick Evaluation

## 2016-06-10 NOTE — Progress Notes (Signed)
Patient ID: Allison KocherLauren A Bernard, female   DOB: 06/18/1996, 20 y.o.   MRN: [id number]   H&P reviewed, no changes  AFVSS gen NAD FHTs 130-140's, good var, category 1  AROM for clear fluid, without difficulty or complication  SVE 3.4/70/-2  Expect SVD. Continue IOL Epidural prn

## 2016-06-11 LAB — CBC
HCT: 27.7 % — ABNORMAL LOW (ref 36.0–46.0)
Hemoglobin: 9.6 g/dL — ABNORMAL LOW (ref 12.0–15.0)
MCH: 28.3 pg (ref 26.0–34.0)
MCHC: 34.7 g/dL (ref 30.0–36.0)
MCV: 81.7 fL (ref 78.0–100.0)
PLATELETS: 187 10*3/uL (ref 150–400)
RBC: 3.39 MIL/uL — AB (ref 3.87–5.11)
RDW: 13.2 % (ref 11.5–15.5)
WBC: 23.9 10*3/uL — AB (ref 4.0–10.5)

## 2016-06-11 LAB — RPR: RPR Ser Ql: NONREACTIVE

## 2016-06-11 MED ORDER — INFLUENZA VAC SPLIT QUAD 0.5 ML IM SUSY
0.5000 mL | PREFILLED_SYRINGE | INTRAMUSCULAR | Status: AC
  Administered 2016-06-11: 0.5 mL via INTRAMUSCULAR
  Filled 2016-06-11: qty 0.5

## 2016-06-11 MED ORDER — PNEUMOCOCCAL VAC POLYVALENT 25 MCG/0.5ML IJ INJ
0.5000 mL | INJECTION | INTRAMUSCULAR | Status: DC
  Filled 2016-06-11: qty 0.5

## 2016-06-11 MED ORDER — METHYLERGONOVINE MALEATE 0.2 MG PO TABS
0.2000 mg | ORAL_TABLET | ORAL | Status: AC | PRN
  Administered 2016-06-11 – 2016-06-12 (×6): 0.2 mg via ORAL
  Filled 2016-06-11 (×6): qty 1

## 2016-06-11 MED ORDER — MISOPROSTOL 200 MCG PO TABS
800.0000 ug | ORAL_TABLET | Freq: Once | ORAL | Status: AC
  Administered 2016-06-11: 800 ug via RECTAL

## 2016-06-11 MED ORDER — METHYLERGONOVINE MALEATE 0.2 MG/ML IJ SOLN: 0.2000 mg | INTRAMUSCULAR | Status: AC | PRN

## 2016-06-11 MED ORDER — MISOPROSTOL 200 MCG PO TABS
ORAL_TABLET | ORAL | Status: AC
  Filled 2016-06-11: qty 4

## 2016-06-11 MED ORDER — OXYTOCIN 40 UNITS IN LACTATED RINGERS INFUSION - SIMPLE MED: 2.5000 [IU]/h | INTRAVENOUS | Status: DC

## 2016-06-11 MED ORDER — FENTANYL CITRATE (PF) 100 MCG/2ML IJ SOLN
INTRAMUSCULAR | Status: AC
  Filled 2016-06-11: qty 2

## 2016-06-11 MED ORDER — METHYLERGONOVINE MALEATE 0.2 MG/ML IJ SOLN
INTRAMUSCULAR | Status: AC
  Filled 2016-06-11: qty 1

## 2016-06-11 MED ORDER — LACTATED RINGERS IV BOLUS (SEPSIS)
500.0000 mL | Freq: Once | INTRAVENOUS | Status: AC
  Administered 2016-06-11: 500 mL via INTRAVENOUS

## 2016-06-11 MED ORDER — FENTANYL CITRATE (PF) 100 MCG/2ML IJ SOLN
50.0000 ug | Freq: Once | INTRAMUSCULAR | Status: AC
  Administered 2016-06-11: 50 ug via INTRAVENOUS

## 2016-06-11 NOTE — Progress Notes (Addendum)
  Patient ID: Allison KocherLauren A Bernard, female   DOB: 01/29/1996, 20 y.o.   MRN: [id number]   CTSP with passing large clot and trickle of clood.  RROB went to evaluate and evacuated 2 softball sized clots.  Uterus deviated to the right.  Pt unable to void since delivery.  On arrival bimanual massage and I&O cath - approximately 400cc of blood with small clots.  Uterus no longer deviated - midline at 1 below umbilicus.    Will start IVF with pitocin and have RN do fundal checks.  D//W pt happens after delivery sometimes, will monitor closely.  Possibly will have to do uterine exploration.    Pt received 50 of fentanyl Pt pn PP hmg protocol - Pt with 1268 -  Total of EBL  RN called to MD as leaving the unit, pt with trickle of blood Uterine massage and vaginal exploration yielded 2 more clots, apx tennis ball sized - 240cc more of blood.  Uterus firm, midline and 2 below umbilicus  Will give 800mcg cytotec

## 2016-06-11 NOTE — Lactation Note (Signed)
This note was copied from a baby's chart. Lactation Consultation Note  Patient Name: [name] UJWJX'BToday's Date: 06/11/2016 Reason for consult: Initial assessment;NICU baby  NICU baby 2521 hours old. Mom had PPH today. Mom pumping when this LC entered the room. Mom's colostrum flowing. Assisted with hand expression and mom's breasts are tender and nipples are sore. Refitted mom with #21 flanges and mom reported increased comfort while pumping. Mom given comfort gels with review. Enc mom to continue pumping every 2-3 hours followed by hand expression. [facility] and [facility] booklet with review. Mom aware of OP/BFSG and LC phone line assistance after D/C. Discussed the benefits of a hospital-grade pump. Mom has private primary insurance but also has Medicaid. [facility]  Maternal Data Has patient been taught Hand Expression?: Yes Does the patient have breastfeeding experience prior to this delivery?: No  Feeding Feeding Type: Formula Nipple Type: Slow - flow Length of feed: 25 min  LATCH Score/Interventions Latch: Repeated attempts needed to sustain latch, nipple held in mouth throughout feeding, stimulation needed to elicit sucking reflex. Intervention(s): Adjust position;Assist with latch;Breast massage  Audible Swallowing: None Intervention(s): Hand expression  Type of Nipple: Everted at rest and after stimulation  Comfort (Breast/Nipple): Soft / non-tender     Hold (Positioning): Assistance needed to correctly position infant at breast and maintain latch. Intervention(s): Support Pillows;Position options  LATCH Score: 6  Lactation Tools Discussed/Used Tools: Flanges;Comfort gels;Pump Flange Size: Other (comment) (#21) Breast pump type: Double-Electric Breast Pump WIC Program: No Pump Review: Setup, frequency, and cleaning;Milk Storage Initiated by:: bedside RN Date initiated::  06/10/16   Consult Status Consult Status: Follow-up Date: 06/12/16 Follow-up type: In-patient    Sherlyn HayJennifer D  06/11/2016, 6:25 PM

## 2016-06-11 NOTE — Anesthesia Postprocedure Evaluation (Signed)
Anesthesia Post Note  Patient: Allison Bernard  Procedure(s) Performed: * No procedures listed *  Patient location during evaluation: Mother Baby Anesthesia Type: Epidural Level of consciousness: awake Pain management: pain level controlled Vital Signs Assessment: post-procedure vital signs reviewed and stable Respiratory status: spontaneous breathing Cardiovascular status: stable Postop Assessment: no headache, no backache, epidural receding and patient able to bend at knees Anesthetic complications: no     Last Vitals:  Vitals:   06/11/16 0635 06/11/16 0655  BP: 104/64 107/60  Pulse: 74 (!) 120  Resp:    Temp:      Last Pain:  Vitals:   06/11/16 0330  TempSrc: Oral  PainSc: 3    Pain Goal:                 Allison Bernard,

## 2016-06-11 NOTE — Progress Notes (Signed)
Dr Ellyn HackBovard called at [phone] due to patient's large gush of blood and clots; dr bovard called rapid response nurse in the meantime while she was on her way who was in the room by 0530; doctor bovard was in the room at 0550. Orders given to give 50mcg fentanyl, start pitocin at 62.5 ml/hr, and give 800mcg cytotec rectally. Pt at about 1520 mL of blood loss total. Given orders to monitor closely with fundal checks every 15 minutes.

## 2016-06-11 NOTE — Progress Notes (Signed)
Patient ID: Allison KocherLauren A Burchfield, female   DOB: 09/15/1996, 20 y.o.   MRN: [id number] CTSP for inability of RN to place foley and pt inability to void  Bladder scanner showed 350cc but pt unable to void.  RN unable to get good urine return placing foley, so asked for assistance Foley placed and concentrated urine in tubing.  Will give a bolus and see if UOP improves. Bleeding minimal

## 2016-06-11 NOTE — Progress Notes (Signed)
Post Partum Day 1 Subjective: CTSp by RN for concern of steady trickle post heavier bleeding this AM.  Pt in bed resting, just got back from bathroom and was able to ambulate with out problem.     Objective: Blood pressure 110/66, pulse 95, temperature 99.3 F (37.4 C), temperature source Oral, resp. rate 16, height 5\' 3"  (1.6 m), weight 70.3 kg (155 lb), last menstrual period 03/10/2015, SpO2 99 %, unknown if currently breastfeeding.  Physical Exam:  General: alert and cooperative Lochia: fundal massage yields slow trickle but no clots  Uterine Fundus:firm, internal sterile exam reveals no vaginal or LUS clots and LUS well-contracted    Recent Labs  06/10/16 0815 06/11/16 0534  HGB 11.1* 9.6*  HCT 32.0* 27.7*    Assessment/Plan: Will give methergine protocol and follow closely Recheck Hgb in AM unless bleeding worsens Clears for now will advance to regular if bleeding fine over next several hours   LOS: 1 day   , W 06/11/2016, 9:49 AM

## 2016-06-11 NOTE — Progress Notes (Signed)
Pt unable to void more than few drops despite several attempts this morning.  Bladder scanner reveals 350 ml urine.  Attempted I&O cath x3 with Sheldon SilvanS. Matthews,RN assisting.  Small amount of urine return noted with each catheter and blood clot in tubing on 2nd attempt.  Unable to drain bladder.  Phoned Dr. Senaida Oresichardson with above information.  She stated she would come place Foley catheter.

## 2016-06-12 LAB — CBC
HCT: 22.9 % — ABNORMAL LOW (ref 36.0–46.0)
Hemoglobin: 8 g/dL — ABNORMAL LOW (ref 12.0–15.0)
MCH: 28.8 pg (ref 26.0–34.0)
MCHC: 34.9 g/dL (ref 30.0–36.0)
MCV: 82.4 fL (ref 78.0–100.0)
PLATELETS: 155 10*3/uL (ref 150–400)
RBC: 2.78 MIL/uL — ABNORMAL LOW (ref 3.87–5.11)
RDW: 13.4 % (ref 11.5–15.5)
WBC: 14.5 10*3/uL — AB (ref 4.0–10.5)

## 2016-06-12 NOTE — Clinical Social Work Maternal (Signed)
  CLINICAL SOCIAL WORK MATERNAL/CHILD NOTE  Patient Details  Name: Allison Bernard MRN: [id number] Date of Birth: 1996-05-20  Date:  06/12/2016  Clinical Social Worker Initiating Note:  Laurey Arrow Date/ Time Initiated:  06/11/16/1500     Child's Name:  Allison Bernard   Legal Guardian:  Mother   Need for Interpreter:  None   Date of Referral:  06/10/16     Reason for Referral:  Parental Support of Premature Babies < 55 weeks/or Critically Ill babies , Behavioral Health Issues, including SI    Referral Source:  NICU   Address:  [address] [place]  Phone number:  [phone]   Household Members:  Self, Spouse   Natural Supports (not living in the home):  Extended Family, Immediate Family, Spouse/significant other, Parent   Professional Supports: Therapist   Employment: Full-time   Type of Work:     Education:  Chiropractor Resources:  [employer]   Other Resources:  [employer]   Cultural/Religious Considerations Which May Impact Care:  None Reported  Strengths:  Ability to meet basic needs , Engineer, materials , Home prepared for child , Understanding of illness   Risk Factors/Current Problems:  Mental Health Concerns    Cognitive State:  Alert , Linear Thinking , Insightful    Mood/Affect:  Comfortable , Interested , Sad    CSW Assessment:CSW met with MOB to complete an assessment for hx of depression and NICU admission. MOB polite and inviting.  MOB introduced her room guest as FOB/Husband Allison Bernard).  MOB gave CSW permission to meet with MOB while FOB was present. MOB was tearful initially and communicated feelings of sadness about infant's NICU admission.  FOB consoled MOB while CSW validated and normalized MOB's thoughts and feelings.  CSW encouraged MOB to visit NICU as often as she likes and encouraged MOB to ask questions. CSW inquired about MOB's hx of depression and MOB reported a dx in 2015.  MOB  communicated that MOB receives medication management and outpatient counseling with [facility].  MOB reported currently not taking medication since pregnancy confirmation, but plans to consult with psychiatrist about re-initiating medicines (MOB has a scheduled appointment but could not remember the date at this time). CSW educated MOB about PPD.  CSW informed MOB of possible supports and interventions to decrease PPD.  CSW also encouraged MOB to seek medical attention if needed for increased signs and symptoms for PPD. CSW reviewed safe sleep and SIDS. MOB was knowledgeable.  MOB communicated that she has a bassinet for the baby and is aware of safe sleep interventions. CSW thanked MOB for her willingness to meet with CSW.  MOB did not have any further questions, concerns, or needs at this time.  CSW will continue to assess MOB for psychosocial stressors while infant is in NICU. CSW Plan/Description:  Patient/Family Education , No Further Intervention Required/No Barriers to Discharge, Information/Referral to [employer] , Psychosocial Support and Ongoing Assessment of Needs   Laurey Arrow, MSW, LCSW Clinical Social Work [phone]   Dimple Nanas, LCSW 06/12/2016, 9:23 AM

## 2016-06-12 NOTE — Progress Notes (Addendum)
Post Partum Day #1, PPH  Subjective: tolerating PO and ready for catheter to come out, bleeding is light  Baby stable in NICU, there for respiratory issues  Objective: Blood pressure 122/71, pulse 79, temperature 97.8 F (36.6 C), temperature source Oral, resp. rate 17, height 5\' 3"  (1.6 m), weight 70.3 kg (155 lb), last menstrual period 03/10/2015, SpO2 99 %, unknown if currently breastfeeding.  Physical Exam:  General: alert Lochia: appropriate Uterine Fundus: firm    Recent Labs  06/11/16 0534 06/12/16 0528  HGB 9.6* 8.0*  HCT 27.7* 22.9*    Assessment/Plan: Plan for discharge tomorrow, monitor bleeding today, continue methergine.  Remove foley and make sure she voids ok.     LOS: 2 days   , D 06/12/2016, 8:21 AM

## 2016-06-12 NOTE — Lactation Note (Signed)
This note was copied from a baby's chart. Lactation Consultation Note Follow up visit at 45 hours of age.  Baby is in NICU and mom is requesting assist with hand expression.  Mom reports pumping with about 1ml expressed.  Mom is able to do hand expression with drops collected. Mom is needing encouragement with technique and understanding of normal volume with hand expression.  LC offered anticipatory guidance and encouraged mom to hand express for short durations more frequently.  Mom to call for assist as needed.    Patient Name: Allison Tish FredericksonLauren Bernard RUEAV'WToday's Date: 06/12/2016 Reason for consult: Follow-up assessment;NICU baby   Maternal Data    Feeding Feeding Type: Formula Nipple Type: Slow - flow Length of feed: 20 min  LATCH Score/Interventions    Audible Swallowing:  (easily expresed colostrum)  Type of Nipple: Everted at rest and after stimulation              Lactation Tools Discussed/Used     Consult Status Consult Status: Follow-up Date: 06/13/16 Follow-up type: In-patient    Allison Bernard,  Allison Bernard 06/12/2016, 5:40 PM

## 2016-06-12 NOTE — Progress Notes (Signed)
Upon taking foley cath out patient tolerated well with no pain.

## 2016-06-12 NOTE — Lactation Note (Signed)
This note was copied from a baby's chart. Lactation Consultation Note  Patient Name: Allison Bernard MVHQI'OToday's Date: 06/12/2016 Reason for consult: Follow-up assessment     withthis mom of a term NICU baby, now 5341 hours old. MOm was able to express good amounts of colostrum yesterday, and none today. I explained that she probably expressed a large bolus of colostrum yesterday, and has a decrease in amount today. I showed mom how to hand express, and she had a steady flow from each breast. I observed mom pumping for a few minutes with 21 flanges, and confirmed they still fit well. Mom is to eat now, and pump after. I advised mom to add hand expression with each pumping. Mom is latching the baby in the NICU, but he is not feeding well at the breast. Mom will call for questions/concerns.   Maternal Data    Feeding Feeding Type: Formula Nipple Type: Slow - flow Length of feed: 15 min  LATCH Score/Interventions    Audible Swallowing:  (easily expresed colostrum)  Type of Nipple: Everted at rest and after stimulation              Lactation Tools Discussed/Used     Consult Status Consult Status: Follow-up Date: 06/13/16 Follow-up type: In-patient    Alfred LevinsLee,  Anne 06/12/2016, 2:28 PM

## 2016-06-13 MED ORDER — IBUPROFEN 600 MG PO TABS: 600.0000 mg | ORAL_TABLET | Freq: Four times a day (QID) | ORAL | 0 refills | Status: DC

## 2016-06-13 MED ORDER — OXYCODONE HCL 5 MG PO TABS: 5.0000 mg | ORAL_TABLET | ORAL | 0 refills | Status: DC | PRN

## 2016-06-13 NOTE — Progress Notes (Signed)
Patient requesting an anesthesia consult prior to discharge from the hospital d/t right-sided hip/lower back pain.  Patient did report that fetal lie was primarily on the right side of her body.  Patient reports occasionally feeling numbness and warmth in the right-sided hip/lower back area, but that her legs and feet never go to sleep.  The area of epidural placement has no redness, erythema, drainage, or warmth, and appears to have no signs of complications.  Patient did not report a lengthy stay in stirrups while pushing during delivery.  I advised patient that this does not sound like an epidural complication, but is likely from the fetal lie during pregnancy and labor.  I advised the patient to discuss this with her primary OB/GYN during her follow-up visits if the hip/lower back pain has not subsided, but that this does not appear to be anesthesia related.

## 2016-06-13 NOTE — Progress Notes (Signed)
Post Partum Day 3 Subjective: Much improved, ambulating and voiding without problem.  Bleeding light.Working on feeds with baby in NICU  Objective: Blood pressure (!) 102/57, pulse 77, temperature 97.6 F (36.4 C), temperature source Oral, resp. rate 18, height 5\' 3"  (1.6 m), weight 70.3 kg (155 lb), last menstrual period 03/10/2015, SpO2 99 %, unknown if currently breastfeeding.  Physical Exam:  General: alert and cooperative Lochia: appropriate Uterine Fundus: firm   Recent Labs  06/11/16 0534 06/12/16 0528  HGB 9.6* 8.0*  HCT 27.7* 22.9*    Assessment/Plan: Discharge home  Oral iron for anemia d/w pt To get loaner pump for breastfeeding while baby in NICU   LOS: 3 days   , W 06/13/2016, 7:59 AM

## 2016-06-13 NOTE — Discharge Summary (Signed)
OB Discharge Summary     Patient Name: Allison Bernard DOB: 1996-07-25 MRN: [id number]  Date of admission: 06/10/2016 Delivering MD: Sherian Rein   Date of discharge: 06/13/2016  Admitting diagnosis: INDUCTION Intrauterine pregnancy: [redacted]w[redacted]d     Secondary diagnosis:  Principal Problem:   SVD (spontaneous vaginal delivery) Active Problems:   Normal pregnancy in third trimester   Normal pregnancy  Additional problems: Mild postpartum hemorrhage    Baby to NICU with pneumonia  Discharge diagnosis: Term Pregnancy Delivered                                                                                                Post partum procedures:cytotec and clot evacuation  Augmentation: AROM and Pitocin  Complications: Hemorrhage>1025mL  Hospital course:  Induction of Labor With Vaginal Delivery   20 y.o. yo G2P0010 at [redacted]w[redacted]d was admitted to the hospital 06/10/2016 for induction of labor.  Indication for induction: Favorable cervix at term.  Patient had an uncomplicated labor course as follows: Membrane Rupture Time/Date: 10:10 AM ,06/10/2016   Intrapartum Procedures: Episiotomy: None [1]                                         Lacerations:  1st degree [2];Labial [10];Perineal [11]  Patient had delivery of a Viable infant.  Information for the patient's newborn:  Aneesah, Hernan [[id number]]  Delivery Method: Vaginal, Spontaneous Delivery (Filed from Delivery Summary)   06/10/2016  Details of delivery can be found in separate delivery note.  Patient had a routine postpartum course. Patient is discharged home 06/13/16.   Physical exam  Vitals:   06/12/16 0528 06/12/16 1320 06/12/16 1835 06/13/16 0449  BP: 122/71 118/65 106/67 (!) 102/57  Pulse: 79 80 78 77  Resp: 17 18 20 18   Temp: 97.8 F (36.6 C)  97.3 F (36.3 C) 97.6 F (36.4 C)  TempSrc: Oral  Oral Oral  SpO2:      Weight:      Height:       General: alert and cooperative Lochia: appropriate Uterine  Fundus: firm  Labs: Lab Results  Component Value Date   WBC 14.5 (H) 06/12/2016   HGB 8.0 (L) 06/12/2016   HCT 22.9 (L) 06/12/2016   MCV 82.4 06/12/2016   PLT 155 06/12/2016   CMP Latest Ref Rng & Units 04/27/2016  Glucose 65 - 99 mg/dL 782(N)  BUN 6 - 20 mg/dL <5(A)  Creatinine 2.13 - 1.00 mg/dL 0.86  Sodium 578 - 469 mmol/L 135  Potassium 3.5 - 5.1 mmol/L 2.7(LL)  Chloride 101 - 111 mmol/L 106  CO2 22 - 32 mmol/L 23  Calcium 8.9 - 10.3 mg/dL 6.2(X)  Total Protein 6.5 - 8.1 g/dL 6.1(L)  Total Bilirubin 0.3 - 1.2 mg/dL 0.4  Alkaline Phos 38 - 126 U/L 88  AST 15 - 41 U/L 22  ALT 14 - 54 U/L 9(L)    Discharge instruction: per After Visit Summary and "Baby and Me Booklet".  After visit meds:  Medication List    STOP taking these medications   acetaminophen 325 MG tablet Commonly known as:  TYLENOL     TAKE these medications   calcium carbonate 500 MG chewable tablet Commonly known as:  TUMS - dosed in mg elemental calcium Chew 1 tablet by mouth 4 (four) times daily as needed for indigestion or heartburn.   ibuprofen 600 MG tablet Commonly known as:  ADVIL,MOTRIN Take 1 tablet (600 mg total) by mouth every 6 (six) hours.   oxyCODONE 5 MG immediate release tablet Commonly known as:  Oxy IR/ROXICODONE Take 1 tablet (5 mg total) by mouth every 4 (four) hours as needed (pain scale 4-7).   prenatal multivitamin Tabs tablet Take 1 tablet by mouth at bedtime.       Diet: routine diet  Activity: Advance as tolerated. Pelvic rest for 6 weeks.   Outpatient follow up:6 weeks Follow up Appt: Future Appointments Date Time Provider Department Center  07/02/2016 9:15 AM Oletta DarterSalina Agarwal, MD [facility] None   Follow up Visit:No Follow-up on file.  Postpartum contraception: Nexplanon (considering)  Newborn Data: Live born female  Birth Weight: 7 lb 6.1 oz (3348 g) APGAR: 9, 9  Baby Feeding: Bottle and Breast Disposition:NICU   06/13/2016 Oliver PilaICHARDSON, W,  MD

## 2016-06-13 NOTE — Addendum Note (Signed)
Addendum  created 06/13/16 1016 by Yolonda KidaAlison L , CRNA   Sign clinical note

## 2016-06-13 NOTE — Lactation Note (Signed)
Lactation Consultation Note  Patient Name: Allison Bernard LWHKN'Z Date: 06/13/2016 Reason for consult: Follow-up assessment;NICU baby  2nd [place] visit today - DEBP paperwork completed - and $30.00 cash collected.  LC recommended since mom is picking up her DEBP from  [facility] Monday .  Since her baby is in NICU - and per mom will be there for 7-9 more days , LC recommended  If the DEBP from [facility] is a Latina - to keep the loaner from [facility] and use it for the time allowed due to the  DEBP being more powerful and will help her establish her milk supply.  Per mom today baby has been to the breast x 2 and mom noted increased swallows.  LC reviewed basics of latching and making sure the depth was achieved.  Earlier reviewed engorgement prevention and tx.  Mom aware when she is visiting baby to plan on breast feeding - have NICU RN assess feeding and  Have NICU RN call LC if needed. Especially when milk comes in.  Plan on bringing DEBP pump kit to pump in the NICU pumping rooms with DEBP .     Maternal Data Has patient been taught Hand Expression?: Yes (per mom feels comfortable with technique )  Feeding    LATCH Score/Interventions                      Lactation Tools Discussed/Used Tools: Pump Breast pump type: Double-Electric Breast Pump WIC Program: Yes   Consult Status Consult Status: Follow-up Date: 06/13/16 Follow-up type: In-patient    Myer Haff 06/13/2016, 2:41 PM

## 2016-06-13 NOTE — Lactation Note (Signed)
Lactation Consultation Note  Patient Name: Allison KocherLauren A Bernard WUJWJ'XToday's Date: 06/13/2016 Reason for consult: Follow-up assessment;NICU baby  Per mom has been breast feeding baby in NICU and pumping 6 times in the last 24 hours and still  Not getting a lot of milk. LC reassured mom it can be a slow process and it's up and down. Mom asked when should her milk come in and when should she be concerned that it isn't coming in.  [facility] basics of [facility] and demand, and the importance of being consistent with pumping and feeding  Baby in NICU when visiting, lots of skin to skin , hand expressing.  Mom had a PPH, and had a significant blood loss. LC reviewed importance of extra fluids , calories to boost Hbg. Dr. Senaida Oresichardson was also in the room and reviewed foods rich in Iron.  Sore nipple and engorgement prevention and tx reviewed.  Mom will be obtaining [facility] WIC [facility] , requiring a 2nd visit today. Pump PW with instructions given to mom.  [facility]  Mother informed of post-discharge support and given phone number to the lactation department, including services  for phone call assistance; out-patient appointments; and breastfeeding support group. List of other breastfeeding resources  in the community given in the handout. Encouraged mother to call for problems or concerns related to breastfeeding.  Maternal Data Has patient been taught Hand Expression?: Yes (per mom feels comfortable with technique )  Feeding    LATCH Score/Interventions                      Lactation Tools Discussed/Used Tools: Pump Breast pump type: Double-Electric Breast Pump WIC Program: Yes   Consult Status Consult Status: Follow-up Date: 06/13/16 Follow-up type: In-patient    Kathrin Greathouseorio,  Ann 06/13/2016, 10:50 AM

## 2016-06-14 ENCOUNTER — Ambulatory Visit: Payer: Self-pay

## 2016-06-14 NOTE — Lactation Note (Signed)
This note was copied from a baby's chart. Lactation Consultation Note  Patient Name: Allison Tish FredericksonLauren Burchfield WUJWJ'XToday's Date: 06/14/2016 Reason for consult: Follow-up assessment;NICU baby   Requested to assist infant with feeding. Assisted mom in positioning infant and holding infant at breast. Mom reports her milk is coming in. Right breast is firm and hard and mom reports milk is not flowing as well from the right side. She is pumping every 3 hours and noticed an increase in milk volume since last night.   Infant latched eagerly to right breast in the cross cradle hold. He nursed intermittently with flanged lips, rhythmic suckles and frequent gulping swallows. Breast was noted to be noticeably softened after feeding. Infant was asleep after feeding. He was offered a bottle post BF and was not taking any milk from it when I left the bedside.   Mom did very well with positioning and supporting infant at breast. Enc mom to call with feeding assistance as needed.    Maternal Data Has patient been taught Hand Expression?: Yes  Feeding Feeding Type: Breast Fed Length of feed: 10 min  LATCH Score/Interventions Latch: Grasps breast easily, tongue down, lips flanged, rhythmical sucking. Intervention(s): Adjust position;Assist with latch;Breast massage;Breast compression  Audible Swallowing: Spontaneous and intermittent Intervention(s): Skin to skin;Hand expression  Type of Nipple: Everted at rest and after stimulation  Comfort (Breast/Nipple): Filling, red/small blisters or bruises, mild/mod discomfort  Problem noted: Mild/Moderate discomfort Interventions (Mild/moderate discomfort): Hand expression  Hold (Positioning): Assistance needed to correctly position infant at breast and maintain latch. Intervention(s): Breastfeeding basics reviewed;Support Pillows;Position options;Skin to skin  LATCH Score: 8  Lactation Tools Discussed/Used     Consult Status Consult Status: PRN Follow-up  type: Call as needed    Ed BlalockSharon S  06/14/2016, 4:15 PM

## 2016-06-16 ENCOUNTER — Ambulatory Visit: Payer: Self-pay

## 2016-06-16 NOTE — Lactation Note (Signed)
This note was copied from a baby's chart. Lactation Consultation Note  Patient Name: [name] NWGNF'AToday's Date: 06/16/2016 Reason for consult: Follow-up assessment;NICU baby  NICU baby 466 days old. Mom states that she will be rooming in with the baby tonight and has questions about her milk supply. Mom reports that her milk came in on day 4, but one breast seems to produce more than the other--collectively she is getting around 1.5 to 2 ounces. Discussed progression of milk coming to volume, and enc mom to continue to hand express after each pumping session. Enc mom to put baby to breast first with each feeding and then supplement with EBM. Mom reports that she can hear the baby swallowing while at breast. Discussed ways of knowing baby getting enough while at the breast, and how to gradually move toward exclusive BF. Enc mom to continue post-pumping to protect milk supply until baby nursing well at the breast.    Maternal Data    Feeding    LATCH Score/Interventions                      Lactation Tools Discussed/Used     Consult Status Consult Status: PRN    Sherlyn HayJennifer D  06/16/2016, 12:49 PM

## 2016-06-17 ENCOUNTER — Ambulatory Visit: Payer: Self-pay

## 2016-06-17 NOTE — Lactation Note (Signed)
This note was copied from a baby's chart. Lactation Consultation Note  Patient Name: Allison Bernard GNFAO'ZToday's Date: 06/17/2016 Reason for consult: Follow-up assessment;NICU baby  NICU baby 797 days old. Parents roomed-in with baby last night. Mom reports that baby continued to be fussy at breast through the night. However, mom continued to put baby to breast first with each feeding, supplemented with EBM after each feeding, and then post-pumped after each as well. Enc mom to continue with this plan until baby transferring milk well at the breast. Scheduled mom for an outpatient appointment with lactation for Friday, 06-19-16 at 1pm. Mom aware of OP/BFSG and LC phone line assistance after D/C.  Maternal Data    Feeding Feeding Type: Breast Fed Length of feed: 20 min  LATCH Score/Interventions Latch: Grasps breast easily, tongue down, lips flanged, rhythmical sucking.  Audible Swallowing: A few with stimulation  Type of Nipple: Everted at rest and after stimulation  Comfort (Breast/Nipple): Soft / non-tender     Hold (Positioning): No assistance needed to correctly position infant at breast.  LATCH Score: 9  Lactation Tools Discussed/Used     Consult Status Consult Status: PRN    Sherlyn HayJennifer D  06/17/2016, 9:34 AM

## 2016-06-19 ENCOUNTER — Ambulatory Visit (HOSPITAL_COMMUNITY)
Admission: RE | Admit: 2016-06-19 | Discharge: 2016-06-19 | Disposition: A | Discharge status: Home / Self Care | Payer: Managed Care, Other (non HMO) | Source: Ambulatory Visit | Attending: Obstetrics and Gynecology | Admitting: Obstetrics and Gynecology

## 2016-06-19 NOTE — Lactation Note (Signed)
Lactation Consult - Baby , [facility] , baby is 29 [facility]  And was discharged from NICU. Baby went to Peds appt..today with weight increase noted Mom is having trouble getting infant latched at night as he is fussy.  Mother's reason for visit: per mom Followup from hospital, won't latch at night  Visit Type: Feeding assessment  Appointment Notes: Assess/ assist with attempt , D/C's from NICU , pt confirmed  Consult:  Initial Lactation Consultant:  Silas FloodSharon S   ________________________________________________________________________  Joan FloresBaby's Name:  Mack HookNoah Samuel-Paul Gregory Date of Birth:  06/10/2017 Pediatrician:  [facility] - Dr. Juanito DoomAgbuya Gender:  female Gestational Age: 469w3d (At Birth) Birth Weight:  7 lb 6.1 oz (3348 g) Weight at Discharge:  Weight: 7 lb 5.5 oz (3330 g)               Date of Discharge:  06/17/2016      Filed Weights   06/14/16 1600 06/15/16 1330 06/16/16 1415  Weight: 7 lb 7.1 oz (3375 g) 7 lb 6.1 oz (3349 g) 7 lb 5.5 oz (3330 g)  [facility]  7 lb 5 oz  Location:Ped appt Weight today:  7-4.4 oz , 3300 g    ________________________________________________________________________  Mother's Name: Jake ChurchLauren A Burchfield Type of delivery:  C/S Breastfeeding Experience:  1st baby  Maternal Medical Conditions:  Depression, anxiety, ADD Maternal Medications:  PNV   ________________________________________________________________________  Breastfeeding History (Post Discharge)  Frequency of breastfeeding: every 2.5-3 hours Duration of feeding: 20-40 minutes  Supplementing: Bottle feeding EBM about 5 times a day 2-2.5 oz, he received 1 bottle of formula since d/c "to see if he would sleep longer"  Pumping: [facility] ( [employer] )   Infant Intake and Output Assessment  Voids: 6-8 in 24 hrs.  Color:  Clear yellow Stools:  2-3 in 24 hrs.  Color:   Yellow  ________________________________________________________________________  Maternal Breast Assessment  Breast:  Full, softer after feeding Nipple:  Erect, pink, using coconut oil Pain level:  1 Pain interventions:  Cream/oil  _______________________________________________________________________ Feeding Assessment/Evaluation  Initial feeding assessment:  Infant's oral assessment:  Variance infant noted to have a high palate, infant with short lingual frenulum and was noted to have good tongue mobility and not interfering with latch. He is noted to have a labial frenulum but is noted to have good flanging of lip.  Positioning:  Cross cradle Left breast  LATCH documentation:  Latch:  2 = Grasps breast easily, tongue down, lips flanged, rhythmical sucking.  Audible swallowing:  2 = Spontaneous and intermittent  Type of nipple:  2 = Everted at rest and after stimulation  Comfort (Breast/Nipple):  1 = Filling, red/small blisters or bruises, mild/mod discomfort  Hold (Positioning):  1 = Assistance needed to correctly position infant at breast and maintain latch  LATCH score:  8  Attached assessment:  Deep  Lips flanged:  Yes.    Lips untucked:  No.  Suck assessment:  Nutritive  Tools:  none Instructed on use and cleaning of tool:  No.  Pre-feed weight: 3300 g, 7-4.4 oz  Post-feed weight:  3338 grams, 7 lb 5.8 oz Amount transferred: 38 ml     Amount supplemented: 0 ml  Additional Feeding Assessment -   Infant's oral assessment:  Variance, see above  Positioning:  Cross cradle Right breast  LATCH documentation:  Latch:  2 = Grasps breast easily, tongue down, lips flanged, rhythmical sucking.  Audible swallowing:  2 = Spontaneous and intermittent  Type of nipple:  2 = Everted at rest and after stimulation  Comfort (Breast/Nipple):  1 = Filling, red/small blisters or bruises, mild/mod discomfort  Hold (Positioning):  1 = Assistance needed to correctly position  infant at breast and maintain latch  LATCH score:  8  Attached assessment:  Shallow  Lips flanged:  Yes.    Lips untucked:  Yes.    Suck assessment:  Displays both  Tools:  none Instructed on use and cleaning of tool:  No.  Pre-feed weight:  3338 gm, 7 lb 5.8 oz Post-feed weight:  3360 gm, 7 lb 6.5 oz Amount transferred:  24 ml Amount supplemented:  0 ml    Total amount pumped post feed:  Not needed  Total amount transferred:  60 ml Total supplement given:  0 ml Lactation Impression: 9 do Infant fed well with good transfer of milk and breast softening during assessment. Infant transferred 60 ml, which is excellent for 9 do infant.   Lactation Plan of Care:  Feedings-  Growth Spurts are normal at 7-10 days, 3 weeks, and 6 weeks Cluster feeding is normal Awaken infant to feed every 2-2.5 hours during the day to encourage decreased feeds at night Compress breast with feedings to maximize milk transfer  Feeding Options: 1. Breastfeed with feeding cues, keep infant awake during feeds with pump after for comfort as needed 2. Feed 1st breast until breast softens, offer infant bottle after feeding then pump 2nd breast for 15-20 minutes 3. Bottle feed infant pumped breast milk then pump both breasts for 15-20 minutes  Weaning pumping- continue to protect established milk supply and to prevent oversupply:  When infant breast feeds well, pump for comfort as needed Recommended pumping times for best yield: after am feeding, midday feeding, and afternoon feeding.  Infant was in NICU and was receiving bottle, was recommended to continue to give one bottle a day for transition to work.

## 2016-07-02 ENCOUNTER — Ambulatory Visit (INDEPENDENT_AMBULATORY_CARE_PROVIDER_SITE_OTHER): Payer: Managed Care, Other (non HMO) | Admitting: Psychiatry

## 2016-07-02 ENCOUNTER — Encounter (HOSPITAL_COMMUNITY): Payer: Self-pay | Admitting: Psychiatry

## 2016-07-02 VITALS — BP 112/66 | HR 69 | Ht 63.0 in | Wt 129.4 lb

## 2016-07-02 DIAGNOSIS — F900 Attention-deficit hyperactivity disorder, predominantly inattentive type: Secondary | ICD-10-CM | POA: Diagnosis not present

## 2016-07-02 DIAGNOSIS — F331 Major depressive disorder, recurrent, moderate: Secondary | ICD-10-CM

## 2016-07-02 DIAGNOSIS — F410 Panic disorder [episodic paroxysmal anxiety] without agoraphobia: Secondary | ICD-10-CM | POA: Diagnosis not present

## 2016-07-02 MED ORDER — PAROXETINE HCL 40 MG PO TABS: 40.0000 mg | ORAL_TABLET | Freq: Every day | ORAL | 3 refills | Status: DC

## 2016-07-02 NOTE — Progress Notes (Signed)
Patient ID: Allison Bernard, female   DOB: 05-03-1996, 20 y.o.   MRN: [id number]  [facility] MD/PA/NP OP Progress Note  07/02/2016 9:44 AM Allison Bernard  MRN:  [id number]  Subjective:   Pt had her baby 3 weeks ago and baby is doing well. She is breastfeeding and finds it stressful. Her husband is helpful.   Pt saw a cardiologist for her chest pain. Pt reports she has a pregnancy associated arrhthymias. No problems with delivery.   Pt is working at [employer] but on maternity leave. Pt is happy with it.   Depression is present. Situational stressors really affect her mood.  Pt is having a lot of low motivation.  Mood improves when she is with people. Reports some isolation, crying spells and anhedonia. Denies worthlessness and hopelessness.  Denies SI/HI. Denies any thoughts of harming her baby.   Pt is having stress induced panic attacks about 1-2x/day.   Anxiety is overwhelming. She finds she worries about her son a lot. Several times a day she will just sit for 15-80min. During this time she does nothing but can force herself to care for the baby.   Pt is eating ok. Sleep is poor due to baby's schedule.  Energy is on the low side due to pregnancy.   Concentration is good. Denies any current symptoms of ADD.  Irritability is present when tired or stressed. Pt reports she is having a low frustration tolerance. Denies AVH. Denies grandiouse thougths and ideas of reference and compulsions.    Chief Complaint: I am tired Chief Complaint    Follow-up     Visit Diagnosis:     ICD-9-CM ICD-10-CM   1. MDD (major depressive disorder), recurrent episode, moderate (HCC) 296.32 F33.1   2. Panic disorder without agoraphobia 300.01 F41.0   3. Attention deficit hyperactivity disorder (ADHD), predominantly inattentive type 314.00 F90.0     Past Medical History:  Past Medical History:  Diagnosis Date  . ADD (attention deficit disorder)   . Anemia   . Anxiety   . Depression    no meds,  doing ok  . Ectopic pregnancy   . Ectopic pregnancy    received methotrexate  . Headache(784.0)    had migraines when she was younger  . Infection    UTI  . Kidney infection   . 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 Normal pregnancy in third trimester 06/09/2016  . Oppositional defiant disorder   . PAC (premature atrial contraction) 01/29/2016  . Palpitations 01/03/2016  . Pregnant   . PVC (premature ventricular contraction) 01/29/2016  . SVD (spontaneous vaginal delivery) 06/10/2016    Past Surgical History:  Procedure Laterality Date  . ANKLE SURGERY Left    reconstruction   . MASS EXCISION Left 07/04/2013   Procedure: EXCISION MASS;  Surgeon: Nadara Mustard, MD;  Location: [facility] OR;  Service: Orthopedics;  Laterality: Left;  Excision Left Foot Talo-calcaneous fibrous bar  . tubes in ears     in the past  . WISDOM TOOTH EXTRACTION     Family History:  Family History  Problem Relation Age of Onset  . Depression Mother   . Anxiety disorder Mother   . Hypertension Father   . Diabetes Father   . Heart failure Maternal Grandmother   . Hypertension Maternal Grandmother   . Cancer Maternal Grandmother     4 types  . Hypertension Maternal Grandfather   . Mesothelioma Maternal Grandfather   . Cancer Paternal Grandmother     uterine  Social History:  Social History   Social History  . Marital status: Single    Spouse name: N/A  . Number of children: N/A  . Years of education: N/A   Social History Main Topics  . Smoking status: Former Smoker    Quit date: 09/28/2015  . Smokeless tobacco: Never Used     Comment: quit prior to preg  . Alcohol use No     Comment: 1 drink a month  . Drug use: No  . Sexual activity: Yes    Birth control/ protection: Pill   Other Topics Concern  . None   Social History Narrative  . None   Additional History: She lives on her parents property. Reports that she has never smoked. She has never used smokeless tobacco. She reports that she drinks alcohol. She  reports that she does not use illicit drugs.   Past Psychiatric History/Hospitalization(s): Anxiety: Yes Bipolar Disorder: No Depression: Yes Mania: No Psychosis: No Schizophrenia: No Personality Disorder: No Hospitalization for psychiatric illness: No History of Electroconvulsive Shock Therapy: No Prior Suicide Attempts: No   Musculoskeletal: Strength & Muscle Tone: within normal limits Gait & Station: normal Patient leans: straight  Psychiatric Specialty Exam: HPI  Review of Systems  Constitutional: Negative for chills, fever and weight loss.  HENT: Negative for congestion, hearing loss and sore throat.   Eyes: Negative for blurred vision, double vision and redness.  Respiratory: Negative for cough, shortness of breath and wheezing.   Cardiovascular: Positive for palpitations. Negative for chest pain and leg swelling.  Gastrointestinal: Positive for abdominal pain. Negative for heartburn, nausea and vomiting.  Musculoskeletal: Positive for back pain and joint pain. Negative for neck pain.  Skin: Negative for itching and rash.  Neurological: Negative for dizziness, seizures, loss of consciousness and headaches.  Psychiatric/Behavioral: Positive for depression. Negative for hallucinations, substance abuse and suicidal ideas. The patient is nervous/anxious. The patient does not have insomnia.     Blood pressure 112/66, pulse 69, height 5\' 3"  (1.6 m), weight 129 lb 6.4 oz (58.7 kg), last menstrual period 03/10/2015, unknown if currently breastfeeding.Body mass index is 22.92 kg/m.  General Appearance: Fairly Groomed  Eye Contact:  Good  Speech:  Clear and Coherent and Normal Rate  Volume:  Normal  Mood:  Anxious and Depressed  Affect:  Full Range  Thought Process:  Goal Directed, Linear and Logical  Orientation:  Full (Time, Place, and Person)  Thought Content:  Negative  Suicidal Thoughts:  No  Homicidal Thoughts:  No  Memory:  Immediate;   Good Recent;   Good Remote;    Good  Judgement:  Good  Insight:  Good  Psychomotor Activity:  Normal  Concentration:  Good  Recall:  Good  Fund of Knowledge: Good  Language: Good  Akathisia:  No  Handed:  Right  AIMS (if indicated):  n/a  Assets:  Communication Skills Desire for Improvement Housing Intimacy Leisure Time Physical Health Resilience Social Support Talents/Skills Transportation Vocational/Educational  ADL's:  Intact  Cognition: WNL  Sleep:  good   Is the patient at risk to self?  No. Has the patient been a risk to self in the past 6 months?  No. Has the patient been a risk to self within the distant past?  No. Is the patient a risk to others?  No. Has the patient been a risk to others in the past 6 months?  No. Has the patient been a risk to others within the distant past?  No.  Current Medications: Current Outpatient Prescriptions  Medication Sig Dispense Refill  . Prenatal Vit-Fe Fumarate-FA (PRENATAL MULTIVITAMIN) TABS tablet Take 1 tablet by mouth at bedtime.     . calcium carbonate (TUMS - DOSED IN MG ELEMENTAL CALCIUM) 500 MG chewable tablet Chew 1 tablet by mouth 4 (four) times daily as needed for indigestion or heartburn.    Marland Kitchen. ibuprofen (ADVIL,MOTRIN) 600 MG tablet Take 1 tablet (600 mg total) by mouth every 6 (six) hours. (Patient not taking: Reported on 07/02/2016) 30 tablet 0  . oxyCODONE (OXY IR/ROXICODONE) 5 MG immediate release tablet Take 1 tablet (5 mg total) by mouth every 4 (four) hours as needed (pain scale 4-7). (Patient not taking: Reported on 07/02/2016) 30 tablet 0   No current facility-administered medications for this visit.      Treatment Plan Summary:Medication management and Plan see below  Axis I: Major depressive disorder- recurrent, moderate; Panic disorder without agorophobia; ADHD inattentive type moderate severity  Axis II: Deferred   Plan:  -restart Paxil at 40mg  po qHS for mood anxiety  Medication management with supportive therapy.  Risks/benefits and SE of the medication discussed. Pt verbalized understanding and verbal consent obtained for treatment. Affirm with the patient that the medications are taken as ordered. Patient expressed understanding of how their medications were to be used.    Labs: reviewed labs 03/30/2015 CBC WNL  Therapy: brief supportive therapy provided. Discussed psychosocial stressors in detail.  Encouraged to restart individual therapy Declined IOP due to work  Pt denies SI and is at an acute low risk for suicide.Patient told to call clinic if any problems occur. Patient advised to go to ER if they should develop SI/HI, side effects, or if symptoms worsen. Has crisis numbers to call if needed. Pt verbalized understanding.  F/up in 2 months or sooner if needed           Oletta DarterSalina  07/02/2016, 9:44 AM

## 2016-07-02 NOTE — Telephone Encounter (Signed)
Spoke with patient and she delivered her son 3 weeks ago and states no problems since then. Advised to call back if any problems arise

## 2016-07-07 ENCOUNTER — Ambulatory Visit (INDEPENDENT_AMBULATORY_CARE_PROVIDER_SITE_OTHER): Payer: Managed Care, Other (non HMO) | Admitting: Psychology

## 2016-07-07 DIAGNOSIS — F331 Major depressive disorder, recurrent, moderate: Secondary | ICD-10-CM

## 2016-07-07 DIAGNOSIS — F411 Generalized anxiety disorder: Secondary | ICD-10-CM | POA: Diagnosis not present

## 2016-07-08 NOTE — Progress Notes (Signed)
   THERAPIST PROGRESS NOTE  Session Time: 11.10am-12pm  Participation Level: Active  Behavioral Response: Fairly GroomedAlertAnxious and Depressed  Type of Therapy: Individual Therapy  Treatment Goals addressed: Diagnosis: GAD, MDD and goal 1.  Interventions: CBT, Strength-based and Supportive  Summary: Allison KocherLauren A Bernard is a 20 y.o. female who presents with some anxious affect.  Pt reported that she is 4 weeks postpartum and was initially rough as her son had to stay in NICU for 1 week.  Pt reported that she also had some hemorrhaging which impacted her ability to be present w/ son first couple of days.  Pt reported that she recently restarted on her meds and feels that this is beneficial.  Pt reported that she is struggling w/ sleep- w/ infant up a lot at night and unable to fall asleep during the day.  Pt reports transition to expectations that infant would be on a schedule at this point.  Pt increased awareness of strengths she has had through being flexible w/ self and expectations.  Pt did have some difficulty w/ breastfeeding and has sought support for this.  Pt reports that her husband is very supportive- but that w/ his change in work schedule she is sole caretaker through the night. Pt receptive to mom and baby support group.   Suicidal/Homicidal: Nowithout intent/plan  Therapist Response: Assessed pt current functioning per pt report.  Explored w/pt transition w/ mood postpartum and transition to parenting.  Assisted w/ normalizing stressors experiencing and focus on her self care and support.  Discussed importance of sleep and strategies for attempting to increase amount of sleep current.  Discussed support groups to connect w/.   Plan: Return again in 2 weeks.  Diagnosis: GAD, MDD   YATES,LEANNE, LPC 07/08/2016

## 2016-07-22 ENCOUNTER — Ambulatory Visit (HOSPITAL_COMMUNITY): Payer: Self-pay | Admitting: Psychology

## 2016-09-15 ENCOUNTER — Ambulatory Visit (HOSPITAL_COMMUNITY): Payer: Self-pay | Admitting: Psychiatry

## 2016-10-27 ENCOUNTER — Ambulatory Visit (INDEPENDENT_AMBULATORY_CARE_PROVIDER_SITE_OTHER): Payer: Managed Care, Other (non HMO) | Admitting: Psychiatry

## 2016-10-27 ENCOUNTER — Encounter (HOSPITAL_COMMUNITY): Payer: Self-pay | Admitting: Psychiatry

## 2016-10-27 VITALS — BP 118/68 | HR 64 | Ht 63.0 in | Wt 113.4 lb

## 2016-10-27 DIAGNOSIS — F330 Major depressive disorder, recurrent, mild: Secondary | ICD-10-CM | POA: Diagnosis not present

## 2016-10-27 DIAGNOSIS — Z808 Family history of malignant neoplasm of other organs or systems: Secondary | ICD-10-CM

## 2016-10-27 DIAGNOSIS — Z833 Family history of diabetes mellitus: Secondary | ICD-10-CM

## 2016-10-27 DIAGNOSIS — Z8249 Family history of ischemic heart disease and other diseases of the circulatory system: Secondary | ICD-10-CM

## 2016-10-27 DIAGNOSIS — F411 Generalized anxiety disorder: Secondary | ICD-10-CM

## 2016-10-27 DIAGNOSIS — F410 Panic disorder [episodic paroxysmal anxiety] without agoraphobia: Secondary | ICD-10-CM | POA: Diagnosis not present

## 2016-10-27 DIAGNOSIS — Z818 Family history of other mental and behavioral disorders: Secondary | ICD-10-CM

## 2016-10-27 DIAGNOSIS — Z9889 Other specified postprocedural states: Secondary | ICD-10-CM | POA: Diagnosis not present

## 2016-10-27 DIAGNOSIS — Z87891 Personal history of nicotine dependence: Secondary | ICD-10-CM

## 2016-10-27 MED ORDER — BUSPIRONE HCL 5 MG PO TABS: 5.0000 mg | ORAL_TABLET | Freq: Two times a day (BID) | ORAL | 2 refills | Status: DC

## 2016-10-27 MED ORDER — PAROXETINE HCL 40 MG PO TABS: 40.0000 mg | ORAL_TABLET | Freq: Every day | ORAL | 3 refills | Status: DC

## 2016-10-27 NOTE — Progress Notes (Signed)
Patient ID: Allison Bernard, female   DOB: 1996-02-03, 21 y.o.   MRN: [id number]  [facility] MD/PA/NP OP Progress Note  10/27/2016 8:47 AM Allison Bernard  MRN:  [id number]  Subjective:  "I have been having a lot of anxiety issues. Depression is pretty well controlled.  Pt's baby is now 15 months old and baby is doing well. She is breastfeeding and finds it stressful. Her husband is helpful.   HPI: reviewed information below with patient on 10/27/16 and same as previous visits except as noted  Reports anxiety is very high. She is constantly worried that the baby is going to be hurt or killed in someway. She tries not to focus on the thoughts but they still come anyway.  She worries about properly caring for him and helping him meet his milestones.   She has constant racing thoughts, insomnia, inability to relax and restlessness. She is always thinking of the worst case scenario.   Pt is having 1 stress induced panic attack a week.   Pt is working at [employer] since Nov.  Pt is happy with it and it is going pretty well. Concentration is not as good as before due to high anxiety. . Denies any current symptoms of ADD.  Depression is present but overall well controlled.. Situational stressors really affect her mood.  Pt is having a lot of low motivation.  Mood improves when she is with people and enjoys social interactions. Reports some crying spells. Denies isolation and anhedonia. Denies worthlessness and hopelessness.  Denies SI/HI. Denies any thoughts of harming her baby.   Pt is eating ok. Sleep is poor due to baby's schedule.  Energy is on the low side due to poor sleep.   Denies irritability but pt reports she is having a low frustration tolerance. Denies AVH. Denies grandiouse thougths and ideas of reference and compulsions.    Chief Complaint: I am anxious Chief Complaint    Depression; Follow-up; Medication Refill     Visit Diagnosis:     ICD-9-CM ICD-10-CM   1. Mild episode of  recurrent major depressive disorder (HCC) 296.31 F33.0 PARoxetine (PAXIL) 40 MG tablet     busPIRone (BUSPAR) 5 MG tablet  2. GAD (generalized anxiety disorder) 300.02 F41.1 PARoxetine (PAXIL) 40 MG tablet     busPIRone (BUSPAR) 5 MG tablet  3. Panic disorder without agoraphobia 300.01 F41.0 PARoxetine (PAXIL) 40 MG tablet    Past Medical History:  Past Medical History:  Diagnosis Date  . ADD (attention deficit disorder)   . Anemia   . Anxiety   . Depression    no meds, doing ok  . Ectopic pregnancy   . Ectopic pregnancy    received methotrexate  . Headache(784.0)    had migraines when she was younger  . Infection    UTI  . Kidney infection   . 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 Normal pregnancy in third trimester 06/09/2016  . Oppositional defiant disorder   . PAC (premature atrial contraction) 01/29/2016  . Palpitations 01/03/2016  . Pregnant   . PVC (premature ventricular contraction) 01/29/2016  . SVD (spontaneous vaginal delivery) 06/10/2016    Past Surgical History:  Procedure Laterality Date  . ANKLE SURGERY Left    reconstruction   . MASS EXCISION Left 07/04/2013   Procedure: EXCISION MASS;  Surgeon: Nadara Mustard, MD;  Location: [facility] OR;  Service: Orthopedics;  Laterality: Left;  Excision Left Foot Talo-calcaneous fibrous bar  . tubes in ears  in the past  . WISDOM TOOTH EXTRACTION     Family History:  Family History  Problem Relation Age of Onset  . Depression Mother   . Anxiety disorder Mother   . Hypertension Father   . Diabetes Father   . Heart failure Maternal Grandmother   . Hypertension Maternal Grandmother   . Cancer Maternal Grandmother     4 types  . Hypertension Maternal Grandfather   . Mesothelioma Maternal Grandfather   . Cancer Paternal Grandmother     uterine   Social History:  Social History   Social History  . Marital status: Single    Spouse name: N/A  . Number of children: N/A  . Years of education: N/A   Social History Main Topics  . Smoking  status: Former Smoker    Quit date: 09/28/2015  . Smokeless tobacco: Never Used     Comment: quit prior to preg  . Alcohol use No     Comment: 1 drink a month  . Drug use: No  . Sexual activity: Yes    Birth control/ protection: Pill   Other Topics Concern  . None   Social History Narrative  . None   Additional History: She lives on her parents property. Reports that she has never smoked. She has never used smokeless tobacco. She reports that she drinks alcohol. She reports that she does not use illicit drugs.   Past Psychiatric History/Hospitalization(s): Anxiety: Yes Bipolar Disorder: No Depression: Yes Mania: No Psychosis: No Schizophrenia: No Personality Disorder: No Hospitalization for psychiatric illness: No History of Electroconvulsive Shock Therapy: No Prior Suicide Attempts: No   Musculoskeletal: Strength & Muscle Tone: within normal limits Gait & Station: normal Patient leans: straight  Psychiatric Specialty Exam: reviewed ROS and MSE on 10/27/16 and same as previous visits except as noted  HPI  Review of Systems  HENT: Negative for sinus pain and tinnitus.   Respiratory: Negative for sputum production and wheezing.   Gastrointestinal: Negative for heartburn.  Neurological: Negative for dizziness, tremors, sensory change, seizures and loss of consciousness.  Psychiatric/Behavioral: Negative for hallucinations, memory loss and substance abuse. The patient is nervous/anxious.     Blood pressure 118/68, pulse 64, height 5\' 3"  (1.6 m), weight 113 lb 6.4 oz (51.4 kg), last menstrual period 03/10/2015, unknown if currently breastfeeding.Body mass index is 20.09 kg/m.  General Appearance: Fairly Groomed  Eye Contact:  Good  Speech:  Clear and Coherent and Normal Rate  Volume:  Normal  Mood:  Anxious  Affect:  Full Range  Thought Process:  Goal Directed, Linear and Logical  Orientation:  Full (Time, Place, and Person)  Thought Content:  Negative  Suicidal  Thoughts:  No  Homicidal Thoughts:  No  Memory:  Immediate;   Good Recent;   Good Remote;   Good  Judgement:  Good  Insight:  Good  Psychomotor Activity:  Normal  Concentration:  Good  Recall:  Good  Fund of Knowledge: Good  Language: Good  Akathisia:  No  Handed:  Right  AIMS (if indicated):  n/a  Assets:  Communication Skills Desire for Improvement Housing Intimacy Leisure Time Physical Health Resilience Social Support Talents/Skills Transportation Vocational/Educational  ADL's:  Intact  Cognition: WNL  Sleep:  good   Is the patient at risk to self?  No. Has the patient been a risk to self in the past 6 months?  No. Has the patient been a risk to self within the distant past?  No. Is the patient  a risk to others?  No. Has the patient been a risk to others in the past 6 months?  No. Has the patient been a risk to others within the distant past?  No.  Current Medications: Current Outpatient Prescriptions  Medication Sig Dispense Refill  . calcium carbonate (TUMS - DOSED IN MG ELEMENTAL CALCIUM) 500 MG chewable tablet Chew 1 tablet by mouth 4 (four) times daily as needed for indigestion or heartburn.    Marland Kitchen. ibuprofen (ADVIL,MOTRIN) 600 MG tablet Take 1 tablet (600 mg total) by mouth every 6 (six) hours. 30 tablet 0  . PARoxetine (PAXIL) 40 MG tablet Take 1 tablet (40 mg total) by mouth daily. 30 tablet 3  . Prenatal Vit-Fe Fumarate-FA (PRENATAL MULTIVITAMIN) TABS tablet Take 1 tablet by mouth at bedtime.     Marland Kitchen. oxyCODONE (OXY IR/ROXICODONE) 5 MG immediate release tablet Take 1 tablet (5 mg total) by mouth every 4 (four) hours as needed (pain scale 4-7). (Patient not taking: Reported on 07/02/2016) 30 tablet 0   No current facility-administered medications for this visit.     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 Risks/benefits and SE of the medication discussed. Pt verbalized understanding and verbal consent obtained for treatment. Affirm with the patient that the medications are taken as ordered. Patient expressed understanding of how their medications were to be used.    Labs: none  Therapy: brief supportive therapy provided. Discussed psychosocial stressors in detail.  Encouraged to restart individual therapy Declined IOP due to work  Pt denies SI and is at an acute low risk for suicide.Patient told to call clinic if any problems occur. Patient advised to go to ER if they should develop SI/HI, side effects, or if symptoms worsen. Has crisis numbers to call if needed. Pt verbalized understanding.  F/up in 2 months or sooner if needed           Oletta DarterSalina  10/27/2016, 8:47 AM

## 2016-10-30 ENCOUNTER — Other Ambulatory Visit: Payer: Self-pay | Admitting: Family

## 2016-10-30 DIAGNOSIS — R1011 Right upper quadrant pain: Secondary | ICD-10-CM

## 2016-11-06 ENCOUNTER — Ambulatory Visit
Admission: RE | Admit: 2016-11-06 | Discharge: 2016-11-06 | Disposition: A | Discharge status: Home / Self Care | Payer: Managed Care, Other (non HMO) | Source: Ambulatory Visit | Attending: Family | Admitting: Family

## 2016-11-06 DIAGNOSIS — R1011 Right upper quadrant pain: Secondary | ICD-10-CM

## 2016-12-11 ENCOUNTER — Encounter (HOSPITAL_COMMUNITY): Payer: Self-pay

## 2016-12-31 ENCOUNTER — Encounter (HOSPITAL_COMMUNITY): Payer: Self-pay | Admitting: Psychiatry

## 2016-12-31 ENCOUNTER — Ambulatory Visit (INDEPENDENT_AMBULATORY_CARE_PROVIDER_SITE_OTHER): Payer: Managed Care, Other (non HMO) | Admitting: Psychiatry

## 2016-12-31 DIAGNOSIS — F330 Major depressive disorder, recurrent, mild: Secondary | ICD-10-CM | POA: Diagnosis not present

## 2016-12-31 DIAGNOSIS — F900 Attention-deficit hyperactivity disorder, predominantly inattentive type: Secondary | ICD-10-CM | POA: Diagnosis not present

## 2016-12-31 DIAGNOSIS — Z79899 Other long term (current) drug therapy: Secondary | ICD-10-CM

## 2016-12-31 DIAGNOSIS — F411 Generalized anxiety disorder: Secondary | ICD-10-CM | POA: Diagnosis not present

## 2016-12-31 DIAGNOSIS — F410 Panic disorder [episodic paroxysmal anxiety] without agoraphobia: Secondary | ICD-10-CM

## 2016-12-31 DIAGNOSIS — Z87891 Personal history of nicotine dependence: Secondary | ICD-10-CM

## 2016-12-31 DIAGNOSIS — Z818 Family history of other mental and behavioral disorders: Secondary | ICD-10-CM

## 2016-12-31 MED ORDER — BUSPIRONE HCL 5 MG PO TABS: 5.0000 mg | ORAL_TABLET | Freq: Two times a day (BID) | ORAL | 2 refills | Status: DC

## 2016-12-31 MED ORDER — PAROXETINE HCL 40 MG PO TABS: 40.0000 mg | ORAL_TABLET | Freq: Every day | ORAL | 3 refills | Status: DC

## 2016-12-31 NOTE — Progress Notes (Signed)
[facility] MD/PA/NP OP Progress Note  12/31/2016 9:23 AM Allison Bernard  MRN:  [id number]  Chief Complaint:  Chief Complaint    Follow-up      HPI: "I feel the most mentally stable I have been since puberty. I am really happy with life".  Pt denies depression. Denies anhedonia, isolation, crying spells, low motivation, poor hygiene, worthlessness and hopelessness. Denies SI/HI.  Sleep is improved now that the baby is sleeping in his own room.   Appetite is good and she is keeping a food journal to make sure she is eating enough.  Energy is improved.   She has some anxiety filled days but they are more manageable. She can calm herself down. Pt has 1-2 panic attacks a month.   Concentration is good and she denies any concerns.    Taking meds as prescribed and denies SE.       Visit Diagnosis:    ICD-9-CM ICD-10-CM   1. Mild episode of recurrent major depressive disorder (HCC) 296.31 F33.0 PARoxetine (PAXIL) 40 MG tablet     busPIRone (BUSPAR) 5 MG tablet  2. GAD (generalized anxiety disorder) 300.02 F41.1 PARoxetine (PAXIL) 40 MG tablet     busPIRone (BUSPAR) 5 MG tablet  3. Panic disorder without agoraphobia 300.01 F41.0 PARoxetine (PAXIL) 40 MG tablet    Past Psychiatric History: see H&P  Past Medical History:  Past Medical History:  Diagnosis Date  . ADD (attention deficit disorder)   . Anemia   . Anxiety   . Depression    no meds, doing ok  . Ectopic pregnancy   . Ectopic pregnancy    received methotrexate  . Headache(784.0)    had migraines when she was younger  . Infection    UTI  . Kidney infection   . 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 Normal pregnancy in third trimester 06/09/2016  . Oppositional defiant disorder   . PAC (premature atrial contraction) 01/29/2016  . Palpitations 01/03/2016  . Pregnant   . PVC (premature ventricular contraction) 01/29/2016  . SVD (spontaneous vaginal delivery) 06/10/2016    Past Surgical History:  Procedure Laterality Date  . ANKLE SURGERY Left     reconstruction   . MASS EXCISION Left 07/04/2013   Procedure: EXCISION MASS;  Surgeon: Nadara Mustard, MD;  Location: [facility] OR;  Service: Orthopedics;  Laterality: Left;  Excision Left Foot Talo-calcaneous fibrous bar  . tubes in ears     in the past  . WISDOM TOOTH EXTRACTION      Family Psychiatric History: Family History  Problem Relation Age of Onset  . Depression Mother   . Anxiety disorder Mother   . Hypertension Father   . Diabetes Father   . Heart failure Maternal Grandmother   . Hypertension Maternal Grandmother   . Cancer Maternal Grandmother     4 types  . Hypertension Maternal Grandfather   . Mesothelioma Maternal Grandfather   . Cancer Paternal Grandmother     uterine    Social History:  Social History   Social History  . Marital status: Single    Spouse name: N/A  . Number of children: N/A  . Years of education: N/A   Social History Main Topics  . Smoking status: Former Smoker    Quit date: 09/28/2015  . Smokeless tobacco: Never Used     Comment: quit prior to preg  . Alcohol use No     Comment: 1 drink a month  . Drug use: No  . Sexual activity:  Yes    Birth control/ protection: Pill   Other Topics Concern  . None   Social History Narrative  . None    Allergies: No Known Allergies  Metabolic Disorder Labs: No results found for: HGBA1C, MPG No results found for: PROLACTIN No results found for: CHOL, TRIG, HDL, CHOLHDL, VLDL, LDLCALC   Current Medications: Current Outpatient Prescriptions  Medication Sig Dispense Refill  . busPIRone (BUSPAR) 5 MG tablet Take 1 tablet (5 mg total) by mouth 2 (two) times daily. 60 tablet 2  . ibuprofen (ADVIL,MOTRIN) 600 MG tablet Take 1 tablet (600 mg total) by mouth every 6 (six) hours. 30 tablet 0  . Levonorgestrel 19.5 MG IUD by Intrauterine route.    Marland Kitchen PARoxetine (PAXIL) 40 MG tablet Take 1 tablet (40 mg total) by mouth daily. 30 tablet 3  . Prenatal Vit-Fe Fumarate-FA (PRENATAL MULTIVITAMIN) TABS tablet  Take 1 tablet by mouth at bedtime.      No current facility-administered medications for this visit.      Musculoskeletal: Strength & Muscle Tone: within normal limits Gait & Station: normal Patient leans: N/A  Psychiatric Specialty Exam: Review of Systems  Gastrointestinal: Negative for abdominal pain, heartburn, nausea and vomiting.  Neurological: Negative for dizziness, tremors, sensory change, seizures, loss of consciousness and headaches.  Psychiatric/Behavioral: Negative for depression, hallucinations, substance abuse and suicidal ideas. The patient is not nervous/anxious and does not have insomnia.     Blood pressure 106/66, pulse 76, height  (1.6 m), weight 114 lb 3.2 oz (51.8 kg), unknown if currently breastfeeding.Body mass index is 20.23 kg/m.  General Appearance: Fairly Groomed  Eye Contact:  Good  Speech:  Clear and Coherent and Normal Rate  Volume:  Normal  Mood:  Euthymic  Affect:  Full Range  Thought Process:  Goal Directed and Descriptions of Associations: Intact  Orientation:  Full (Time, Place, and Person)  Thought Content: Logical   Suicidal Thoughts:  No  Homicidal Thoughts:  No  Memory:  Immediate;   Good Recent;   Good Remote;   Good  Judgement:  Good  Insight:  Good  Psychomotor Activity:  Normal  Concentration:  Concentration: Good  Recall:  Good  Fund of Knowledge: Good  Language: Good  Akathisia:  No  Handed:  Right  AIMS (if indicated):  n/a  Assets:  Communication Skills Desire for Improvement  ADL's:  Intact  Cognition: WNL  Sleep:  fair     Treatment Plan Summary:Medication management   Assessment: MDD- recurrent- in partial remission; 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 Risks/benefits and SE of the medication discussed. Pt verbalized understanding and verbal consent obtained for treatment.  Affirm with the patient that the medications are taken as  ordered. Patient expressed understanding of how their medications were to be used.   The risk of un-intended pregnancy is low based on the fact that pt reports she is using an IUD. Pt is aware that these meds carry a teratogenic risk. Pt will discuss plan of action if she does or plans to become pregnant in the future.   Meds: continue Paxil  po qD for mood and anxiety -continue Buspar  po BID for anxiety and mood augmentation - pt is breastfeeding and understand both meds can pass thru breastmilk to baby   Labs: none   Therapy: brief supportive therapy provided. Discussed psychosocial stressors in detail.     Consultations:  None  Pt denies SI and is  at an acute low risk for suicide. Patient told to call clinic if any problems occur. Patient advised to go to ER if they should develop SI/HI, side effects, or if symptoms worsen. Has crisis numbers to call if needed. Pt verbalized understanding.  F/up in 3 months or sooner if needed    Oletta Darter, MD 12/31/2016, 9:23 AM

## 2017-04-01 ENCOUNTER — Ambulatory Visit (HOSPITAL_COMMUNITY): Payer: Managed Care, Other (non HMO) | Admitting: Psychiatry

## 2017-05-06 ENCOUNTER — Encounter (HOSPITAL_COMMUNITY): Payer: Self-pay | Admitting: Psychology

## 2017-05-06 NOTE — Progress Notes (Signed)
Allison Bernard is a 21 y.o. female patient discharged from counseling as no longer active.  Outpatient Therapist Discharge Summary  Allison Bernard    09/14/1996   Admission Date: 02/20/16   Discharge Date:  05/05/17 Reason for Discharge:  Not active Diagnosis:  GAD Comments:  Last seen in counseling 07/07/16   Allison Bernard           ,, [facility]

## 2017-07-26 ENCOUNTER — Encounter (HOSPITAL_COMMUNITY): Payer: Self-pay | Admitting: *Deleted

## 2017-07-26 ENCOUNTER — Inpatient Hospital Stay (HOSPITAL_COMMUNITY)
Admission: AD | Admit: 2017-07-26 | Discharge: 2017-07-26 | Disposition: A | Discharge status: Home / Self Care | Payer: 59 | Source: Ambulatory Visit | Attending: Obstetrics and Gynecology | Admitting: Obstetrics and Gynecology

## 2017-07-26 DIAGNOSIS — F419 Anxiety disorder, unspecified: Secondary | ICD-10-CM | POA: Insufficient documentation

## 2017-07-26 DIAGNOSIS — Z975 Presence of (intrauterine) contraceptive device: Secondary | ICD-10-CM | POA: Diagnosis not present

## 2017-07-26 DIAGNOSIS — Z833 Family history of diabetes mellitus: Secondary | ICD-10-CM | POA: Diagnosis not present

## 2017-07-26 DIAGNOSIS — Z8619 Personal history of other infectious and parasitic diseases: Secondary | ICD-10-CM | POA: Insufficient documentation

## 2017-07-26 DIAGNOSIS — Z8249 Family history of ischemic heart disease and other diseases of the circulatory system: Secondary | ICD-10-CM | POA: Diagnosis not present

## 2017-07-26 DIAGNOSIS — I491 Atrial premature depolarization: Secondary | ICD-10-CM | POA: Diagnosis not present

## 2017-07-26 DIAGNOSIS — Z79899 Other long term (current) drug therapy: Secondary | ICD-10-CM | POA: Diagnosis not present

## 2017-07-26 DIAGNOSIS — R109 Unspecified abdominal pain: Secondary | ICD-10-CM | POA: Diagnosis present

## 2017-07-26 DIAGNOSIS — Z9889 Other specified postprocedural states: Secondary | ICD-10-CM | POA: Diagnosis not present

## 2017-07-26 DIAGNOSIS — Z809 Family history of malignant neoplasm, unspecified: Secondary | ICD-10-CM | POA: Insufficient documentation

## 2017-07-26 DIAGNOSIS — Z87440 Personal history of urinary (tract) infections: Secondary | ICD-10-CM | POA: Diagnosis not present

## 2017-07-26 DIAGNOSIS — Z87891 Personal history of nicotine dependence: Secondary | ICD-10-CM | POA: Insufficient documentation

## 2017-07-26 DIAGNOSIS — Z808 Family history of malignant neoplasm of other organs or systems: Secondary | ICD-10-CM | POA: Insufficient documentation

## 2017-07-26 DIAGNOSIS — Z8049 Family history of malignant neoplasm of other genital organs: Secondary | ICD-10-CM | POA: Insufficient documentation

## 2017-07-26 DIAGNOSIS — Z8759 Personal history of other complications of pregnancy, childbirth and the puerperium: Secondary | ICD-10-CM | POA: Insufficient documentation

## 2017-07-26 DIAGNOSIS — F329 Major depressive disorder, single episode, unspecified: Secondary | ICD-10-CM | POA: Diagnosis not present

## 2017-07-26 DIAGNOSIS — N926 Irregular menstruation, unspecified: Secondary | ICD-10-CM | POA: Diagnosis not present

## 2017-07-26 DIAGNOSIS — Z818 Family history of other mental and behavioral disorders: Secondary | ICD-10-CM | POA: Insufficient documentation

## 2017-07-26 DIAGNOSIS — I493 Ventricular premature depolarization: Secondary | ICD-10-CM | POA: Diagnosis not present

## 2017-07-26 LAB — URINALYSIS, ROUTINE W REFLEX MICROSCOPIC
BACTERIA UA: NONE SEEN
Bilirubin Urine: NEGATIVE
GLUCOSE, UA: NEGATIVE mg/dL
Ketones, ur: NEGATIVE mg/dL
Nitrite: NEGATIVE
PROTEIN: 30 mg/dL — AB
Specific Gravity, Urine: 1.028 (ref 1.005–1.030)
pH: 5 (ref 5.0–8.0)

## 2017-07-26 LAB — POCT PREGNANCY, URINE: Preg Test, Ur: NEGATIVE

## 2017-07-26 NOTE — MAU Note (Signed)
Woke up this morning with severe cramping.  Checked to see if IUD was in place. (placed about a yr ago) Couldn't feel strings.  Called dr, was told to come in .  Last Thur, had some heavier bleeding. Has spotted off and on since IUD was placed.

## 2017-07-26 NOTE — MAU Note (Signed)
Pt presents with c/o lower abdominal pain that began @ 0300 this am.  Pt states pain is sharp, aching, and cramping.  Pt also reports she couldn't feel IUD string and was instructed to come to MAU by Gyn provider.  Pt reports spotting, states has been spotting since IUD insertion, November 2017

## 2017-07-26 NOTE — Discharge Instructions (Signed)

## 2017-07-26 NOTE — MAU Provider Note (Signed)
Patient Allison Bernard is a 21 y.o. [id number] Non pregnant female here with complaints of abdominal pain and being unable to feel her IUD strings.  She denies abnormal discharge, dysuria, or back pain.   She has not had a period in two years; she does not think that this is her period.   Patient called the office and was told that she could come to MAU or wait till the office was open. Patient decided to come to MAU.  History     CSN: [id number]  Arrival date and time: 07/26/17 4098   None     Chief Complaint  Patient presents with  . Abdominal Pain  . ? IUD movement   Abdominal Pain  This is a new problem. The current episode started today. The onset quality is sudden. The problem has been resolved. The pain is located in the suprapubic region. The pain is at a severity of 8/10. The quality of the pain is cramping. The abdominal pain does not radiate. Pertinent negatives include no constipation, diarrhea, dysuria, frequency, nausea or vomiting. Nothing aggravates the pain. The pain is relieved by nothing. She has tried acetaminophen for the symptoms. The treatment provided no relief.    OB History    Gravida Para Term Preterm AB Living   2       1 1    SAB TAB Ectopic Multiple Live Births       1 0        Past Medical History:  Diagnosis Date  . ADD (attention deficit disorder)   . Anemia   . Anxiety   . Depression    no meds, doing ok  . Ectopic pregnancy   . Ectopic pregnancy    received methotrexate  . Headache(784.0)    had migraines when she was younger  . Infection    UTI  . Kidney infection   . 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 Normal pregnancy in third trimester 06/09/2016  . Oppositional defiant disorder   . PAC (premature atrial contraction) 01/29/2016  . Palpitations 01/03/2016  . Pregnant   . PVC (premature ventricular contraction) 01/29/2016  . SVD (spontaneous vaginal delivery) 06/10/2016    Past Surgical History:  Procedure Laterality Date  . ANKLE SURGERY Left    reconstruction   . MASS EXCISION Left 07/04/2013   Procedure: EXCISION MASS;  Surgeon: Nadara Mustard, MD;  Location: [facility] OR;  Service: Orthopedics;  Laterality: Left;  Excision Left Foot Talo-calcaneous fibrous bar  . tubes in ears     in the past  . WISDOM TOOTH EXTRACTION      Family History  Problem Relation Age of Onset  . Depression Mother   . Anxiety disorder Mother   . Hypertension Father   . Diabetes Father   . Heart failure Maternal Grandmother   . Hypertension Maternal Grandmother   . Cancer Maternal Grandmother        4 types  . Diabetes Maternal Grandmother   . Hypertension Maternal Grandfather   . Mesothelioma Maternal Grandfather   . Cancer Paternal Grandmother        uterine    Social History  Substance Use Topics  . Smoking status: Former Smoker    Quit date: 09/28/2015  . Smokeless tobacco: Never Used     Comment: quit prior to preg  . Alcohol use No     Comment: 1 drink a month    Allergies: No Known Allergies  Prescriptions Prior to Admission  Medication Sig  Dispense Refill Last Dose  . acetaminophen (TYLENOL) 325 MG tablet Take by mouth every 6 (six) hours as needed for moderate pain or headache.   07/26/2017 at Unknown time  . PARoxetine (PAXIL) 40 MG tablet Take 1 tablet (40 mg total) by mouth daily. 30 tablet 3 07/25/2017 at Unknown time  . Prenatal Vit-Fe Fumarate-FA (PRENATAL MULTIVITAMIN) TABS tablet Take 1 tablet by mouth at bedtime.    07/25/2017 at Unknown time  . busPIRone (BUSPAR) 5 MG tablet Take 1 tablet (5 mg total) by mouth 2 (two) times daily. (Patient not taking: Reported on 07/26/2017) 60 tablet 2 Not Taking at Unknown time  . Levonorgestrel 19.5 MG IUD by Intrauterine route. Was place 11/17   Taking    Review of Systems  HENT: Negative.   Respiratory: Negative.   Cardiovascular: Negative.   Gastrointestinal: Positive for abdominal pain. Negative for constipation, diarrhea, nausea and vomiting.  Genitourinary: Negative for dysuria  and frequency.  Neurological: Negative.   Hematological: Negative.    Physical Exam   Blood pressure 117/74, pulse (!) 110, temperature 97.9 F (36.6 C), temperature source Oral, resp. rate 16, weight 124 lb 12 oz (56.6 kg), SpO2 99 %, currently breastfeeding.  Physical Exam  Constitutional: She is oriented to person, place, and time. She appears well-developed and well-nourished.  HENT:  Head: Normocephalic.  Neck: Normal range of motion.  Respiratory: Effort normal.  GI: Soft.  Genitourinary: Vagina normal.  Genitourinary Comments: NEFG; pink blood in the vagina and extruding from the OS, consistent with beginning of menses. IUD strings visualized. No CMT, suprapubic or adnexal tenderness.   Musculoskeletal: Normal range of motion.  Neurological: She is alert and oriented to person, place, and time.  Skin: Skin is warm and dry.  Psychiatric: She has a normal mood and affect.    MAU Course  Procedures  MDM -UPT negative -bimanual exam benign.   Assessment and Plan   1. Irregular menstruation    2. Patient stable for discharge; reassured her of the normalcy of occasional menses with an IUD. Recommend that if her periods continue, she should contact her Ob for US.  3. Comfort measures recommended. 4. Plan of care discussed with dr. Senaida Oresichardson, who agrees.   Charlesetta GaribaldiKathryn Lorraine  CNM 07/26/2017, 10:40 AM

## 2017-07-29 ENCOUNTER — Ambulatory Visit (HOSPITAL_COMMUNITY): Payer: Self-pay | Admitting: Psychiatry

## 2017-08-17 ENCOUNTER — Ambulatory Visit (HOSPITAL_COMMUNITY): Payer: Self-pay | Admitting: Psychiatry

## 2017-08-26 ENCOUNTER — Ambulatory Visit (HOSPITAL_COMMUNITY): Payer: 59 | Admitting: Psychiatry

## 2017-08-26 ENCOUNTER — Encounter (HOSPITAL_COMMUNITY): Payer: Self-pay | Admitting: Psychiatry

## 2017-08-26 DIAGNOSIS — Z818 Family history of other mental and behavioral disorders: Secondary | ICD-10-CM

## 2017-08-26 DIAGNOSIS — Z87891 Personal history of nicotine dependence: Secondary | ICD-10-CM | POA: Diagnosis not present

## 2017-08-26 DIAGNOSIS — F410 Panic disorder [episodic paroxysmal anxiety] without agoraphobia: Secondary | ICD-10-CM

## 2017-08-26 DIAGNOSIS — F330 Major depressive disorder, recurrent, mild: Secondary | ICD-10-CM

## 2017-08-26 DIAGNOSIS — F411 Generalized anxiety disorder: Secondary | ICD-10-CM

## 2017-08-26 DIAGNOSIS — Z79899 Other long term (current) drug therapy: Secondary | ICD-10-CM

## 2017-08-26 MED ORDER — PAROXETINE HCL 40 MG PO TABS: 40.0000 mg | ORAL_TABLET | Freq: Every day | ORAL | 1 refills | Status: DC

## 2017-08-26 MED ORDER — BUSPIRONE HCL 10 MG PO TABS: 10.0000 mg | ORAL_TABLET | Freq: Two times a day (BID) | ORAL | 1 refills | Status: DC

## 2017-08-26 NOTE — Progress Notes (Signed)
BH MD/PA/NP OP Progress Note  08/26/2017 11:43 AM Christain SacramentoLauren A Galka  MRN:  [id number]  Chief Complaint:  Chief Complaint    Follow-up     HPI: Pt last seen April 2018. Here with her 89mo son.  Pt states she is ok but she is more anxious for the last 3 months when she quit working. Pt is restless and always worried about something bad happening. It is overwhelming. Pt has random, rare panic attacks when alone at night. They are not as severe as before. Pt notices that anxiety is better when she is out. Pt is not able able to rationalize it away. 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 She had a friend who moved away. She is spending a lot of time with her mom or alone in the house.   Pt is getting out bed in the morning and is motivated to keep her home clean and take care of the baby. Depression is well managed. Pt denies hopelessness and sadness. She is enjoying her life. Sleep is ok. Appetite is decreased. Pt denies SI/HI.  Pt states-taking meds as prescribed and denies SE. Meds help with depression.    Visit Diagnosis:    ICD-10-CM   1. Mild episode of recurrent major depressive disorder (HCC) F33.0 busPIRone (BUSPAR) 10 MG tablet    PARoxetine (PAXIL) 40 MG tablet  2. GAD (generalized anxiety disorder) F41.1 busPIRone (BUSPAR) 10 MG tablet    PARoxetine (PAXIL) 40 MG tablet  3. 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 ADD (attention deficit disorder)   . Anemia   . Anxiety   . Depression    no meds, doing ok  . Ectopic pregnancy   . Ectopic pregnancy    received methotrexate  . Headache(784.0)    had migraines when she  was younger  . Infection    UTI  . Kidney infection   . 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 Normal pregnancy in third trimester 06/09/2016  . Oppositional defiant disorder   . PAC (premature atrial contraction) 01/29/2016  . Palpitations 01/03/2016  . Pregnant   . PVC (premature ventricular contraction) 01/29/2016  . SVD (spontaneous vaginal delivery) 06/10/2016    Past Surgical History:  Procedure Laterality Date  . ANKLE SURGERY Left    reconstruction   . MASS EXCISION Left 07/04/2013   Procedure: EXCISION MASS;  Surgeon: Nadara MustardMarcus V Duda, MD;  [facility];  Service: Orthopedics;  Laterality: Left;  Excision Left Foot Talo-calcaneous fibrous bar  . tubes in ears     in the past  . WISDOM TOOTH EXTRACTION      Family Psychiatric History:  Family History  Problem Relation Age of Onset  . Depression Mother   . Anxiety disorder Mother   . Hypertension Father   . Diabetes Father   . Heart failure Maternal Grandmother   . Hypertension Maternal Grandmother   . Cancer Maternal Grandmother        4 types  . Diabetes Maternal Grandmother   . Hypertension Maternal Grandfather   . Mesothelioma Maternal Grandfather   . Cancer Paternal Grandmother        uterine    Social History:  Social History   Socioeconomic History  . Marital status: Single    Spouse name: None  . Number of children: None  . Years of education: None  . Highest education level: None  Social Needs  . Financial resource strain: None  . Food insecurity - worry: None  . Food insecurity - inability: None  . Transportation needs - medical: None  . Transportation needs - non-medical: None  Occupational History  . None  Tobacco Use  . Smoking status: Former Smoker    Last attempt to quit: 09/28/2015    Years since quitting: 1.9  . Smokeless tobacco: Never Used  . Tobacco comment: quit prior to preg  Substance and Sexual Activity  . Alcohol use: No    Comment: 1 drink a month  . Drug use: No  . Sexual activity: Yes     Birth control/protection: Pill  Other Topics Concern  . None  Social History Narrative  . None    Allergies: No Known Allergies  Metabolic Disorder Labs: No results found for: HGBA1C, MPG No results found for: PROLACTIN No results found for: CHOL, TRIG, HDL, CHOLHDL, VLDL, LDLCALC No results found for: TSH  Therapeutic Level Labs: No results found for: LITHIUM No results found for: VALPROATE No components found for:  CBMZ  Current Medications: Current Outpatient Medications  Medication Sig Dispense Refill  . acetaminophen (TYLENOL) 325 MG tablet Take by mouth every 6 (six) hours as needed for moderate pain or headache.    . busPIRone (BUSPAR) 5 MG tablet Take 1 tablet (5 mg total) by mouth 2 (two) times daily. 60 tablet 2  . Levonorgestrel 19.5 MG IUD by Intrauterine route. Was place 11/17    . PARoxetine (PAXIL) 40 MG tablet Take 1 tablet (40 mg total) by mouth daily. 30 tablet 3  . Prenatal Vit-Fe Fumarate-FA (PRENATAL MULTIVITAMIN) TABS tablet Take 1 tablet by mouth at bedtime.      No current facility-administered medications for this visit.      Musculoskeletal: Strength & Muscle Tone: within normal limits Gait & Station: normal Patient leans: N/A  Psychiatric Specialty Exam: Review of Systems  HENT: Positive for congestion. Negative for nosebleeds, sinus pain and sore throat.   Neurological: Positive for headaches. Negative for dizziness, tingling, tremors and sensory change.    Blood pressure 122/80, pulse 64, resp. rate 12, height 5\' 3"  (1.6 m), weight 121 lb 3.2 oz (55 kg), currently breastfeeding.Body mass index is 21.47 kg/m.  General Appearance: Casual  Eye Contact:  Good  Speech:  Clear and Coherent and Normal Rate  Volume:  Normal  Mood:  Anxious  Affect:  Congruent  Thought Process:  Goal Directed and Descriptions of Associations: Intact  Orientation:  Full (Time, Place, and Person)  Thought Content: Logical   Suicidal Thoughts:  No  Homicidal  Thoughts:  No  Memory:  Immediate;   Good Recent;   Good Remote;   Good  Judgement:  Good  Insight:  Good  Psychomotor Activity:  Normal  Concentration:  Concentration: Good and Attention Span: Good  Recall:  Good  Fund of Knowledge: Good  Language: Good  Akathisia:  No  Handed:  Right  AIMS (if indicated): not done  Assets:  Communication Skills Desire for Improvement Financial Resources/Insurance Housing Intimacy Leisure Time Resilience Social Support Talents/Skills Transportation  ADL's:  Intact  Cognition: WNL  Sleep:  Fair   Screenings: PHQ2-9     Counselor from 06/06/2015 in BEHAVIORAL HEALTH OUTPATIENT THERAPY [place]  PHQ-2  Total Score  4  PHQ-9 Total Score  13       Assessment and Plan: MDD-recurrent; panic disorder without agoraphobia; ADHD-inattentive type    Medication management with supportive therapy. Risks/benefits and SE of the medication discussed. Pt verbalized understanding and verbal consent obtained for treatment.  Affirm with the patient that the medications are taken as ordered. Patient expressed understanding of how their medications were to be used.   The risk of un-intended pregnancy is low based on the fact that pt reports she has an IUD. Pt is aware that these meds carry a teratogenic risk. Pt will discuss plan of action if she does or plans to become pregnant in the future.   Meds: Paxil 40 mg p.o. daily for depression and anxiety Increase BuSpar 10 mg p.o. twice daily for anxiety and mood augmentation Pt is breastfeeding.   Labs: none  Therapy: brief supportive therapy provided. Discussed psychosocial stressors in detail.     Consultations: Encouraged to follow up with PCP as needed  Pt denies SI and is at an acute low risk for suicide. Patient told to call clinic if any problems occur. Patient advised to go to ER if they should develop SI/HI, side effects, or if symptoms worsen. Has crisis numbers to call if needed. Pt  verbalized understanding.  F/up in 2 months or sooner if needed    Oletta Darter, MD 08/26/2017, 11:43 AM

## 2017-10-28 ENCOUNTER — Encounter (HOSPITAL_COMMUNITY): Payer: Self-pay | Admitting: Psychiatry

## 2017-10-28 ENCOUNTER — Ambulatory Visit (HOSPITAL_COMMUNITY): Payer: 59 | Admitting: Psychiatry

## 2017-10-28 VITALS — BP 110/66 | HR 98 | Ht 63.0 in | Wt 130.0 lb

## 2017-10-28 DIAGNOSIS — F330 Major depressive disorder, recurrent, mild: Secondary | ICD-10-CM

## 2017-10-28 DIAGNOSIS — Z818 Family history of other mental and behavioral disorders: Secondary | ICD-10-CM

## 2017-10-28 DIAGNOSIS — F410 Panic disorder [episodic paroxysmal anxiety] without agoraphobia: Secondary | ICD-10-CM

## 2017-10-28 DIAGNOSIS — F411 Generalized anxiety disorder: Secondary | ICD-10-CM | POA: Diagnosis not present

## 2017-10-28 DIAGNOSIS — R450 Nervousness: Secondary | ICD-10-CM

## 2017-10-28 DIAGNOSIS — F900 Attention-deficit hyperactivity disorder, predominantly inattentive type: Secondary | ICD-10-CM | POA: Diagnosis not present

## 2017-10-28 DIAGNOSIS — Z87891 Personal history of nicotine dependence: Secondary | ICD-10-CM | POA: Diagnosis not present

## 2017-10-28 MED ORDER — BUSPIRONE HCL 15 MG PO TABS: 15.0000 mg | ORAL_TABLET | Freq: Two times a day (BID) | ORAL | 1 refills | Status: DC

## 2017-10-28 MED ORDER — PAROXETINE HCL 40 MG PO TABS: 40.0000 mg | ORAL_TABLET | Freq: Every day | ORAL | 1 refills | Status: DC

## 2017-10-28 NOTE — Progress Notes (Signed)
BH MD/PA/NP OP Progress Note  10/28/2017 10:48 AM Christain SacramentoLauren A Bauserman  MRN:  [id number]  Chief Complaint:  Chief Complaint    Depression     HPI: Pt states she is not having as many panic attacks. They come out of the blue when she is not busy. When she is busy she does not have panic attacks.   She feels anxious all the time. She has butterflies in her stomach. She is overwhelmed and feels like an "elephant is sitting on my chest". Sleep is pretty good and energy is fair.   Pt denies depression. She occasionally has a depressing thoughts but is able to overcome it quickly. Pt denies isolation, anhedonia and hopelessness. Pt denies SI/HI.  Patient states she is willing to consider treatment with Abilify but wants to wait to wean off her son.  She will follow-up in 2 months.  Visit Diagnosis:    ICD-10-CM   1. GAD (generalized anxiety disorder) F41.1 PARoxetine (PAXIL) 40 MG tablet    busPIRone (BUSPAR) 15 MG tablet  2. 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 Mild episode of recurrent major depressive disorder (HCC) F33.0 PARoxetine (PAXIL) 40 MG tablet    busPIRone (BUSPAR) 15 MG tablet    Past Psychiatric History:  Anxiety: Yes Bipolar Disorder: No Depression: Yes Mania: No Psychosis: No Schizophrenia: No Personality Disorder: No Hospitalization for psychiatric illness: No History of Electroconvulsive Shock Therapy: No Prior Suicide Attempts: No   Past Medical History:  Past Medical History:  Diagnosis Date  . ADD (attention deficit disorder)   . Anemia   . Anxiety   . Depression    no meds, doing ok  . Ectopic pregnancy   . Ectopic pregnancy    received methotrexate  . Headache(784.0)    had migraines when she was younger  . Infection    UTI  . Kidney infection   . 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 Normal pregnancy in third trimester 06/09/2016  . Oppositional defiant disorder   . PAC (premature atrial contraction) 01/29/2016  . Palpitations  01/03/2016  . Pregnant   . PVC (premature ventricular contraction) 01/29/2016  . SVD (spontaneous vaginal delivery) 06/10/2016    Past Surgical History:  Procedure Laterality Date  . ANKLE SURGERY Left    reconstruction   . MASS EXCISION Left 07/04/2013   Procedure: EXCISION MASS;  Surgeon: Nadara MustardMarcus V Duda, MD;  [facility];  Service: Orthopedics;  Laterality: Left;  Excision Left Foot Talo-calcaneous fibrous bar  . tubes in ears     in the past  . WISDOM TOOTH EXTRACTION      Family Psychiatric History:  Family History  Problem Relation Age of Onset  . Depression Mother   . Anxiety disorder Mother   . Hypertension Father   . Diabetes Father   . Heart failure Maternal Grandmother   . Hypertension Maternal Grandmother   . Cancer Maternal Grandmother        4 types  . Diabetes Maternal Grandmother   . Hypertension Maternal Grandfather   . Mesothelioma Maternal Grandfather   . Cancer Paternal Grandmother        uterine    Social History:  Social History   Socioeconomic History  . Marital status: Single    Spouse name: None  . Number of children: None  . Years of education: None  . Highest education level: None  Social Needs  . Financial resource strain: None  . Food insecurity - worry:  None  . Food insecurity - inability: None  . Transportation needs - medical: None  . Transportation needs - non-medical: None  Occupational History  . None  Tobacco Use  . Smoking status: Former Smoker    Last attempt to quit: 09/28/2015    Years since quitting: 2.0  . Smokeless tobacco: Never Used  . Tobacco comment: quit prior to preg  Substance and Sexual Activity  . Alcohol use: No    Comment: 1 drink a month  . Drug use: No  . Sexual activity: Yes    Birth control/protection: Pill  Other Topics Concern  . None  Social History Narrative  . None    Allergies: No Known Allergies  Metabolic Disorder Labs: No results found for: HGBA1C, MPG No results found for:  PROLACTIN No results found for: CHOL, TRIG, HDL, CHOLHDL, VLDL, LDLCALC No results found for: TSH  Therapeutic Level Labs: No results found for: LITHIUM No results found for: VALPROATE No components found for:  CBMZ  Current Medications: Current Outpatient Medications  Medication Sig Dispense Refill  . acetaminophen (TYLENOL) 325 MG tablet Take by mouth every 6 (six) hours as needed for moderate pain or headache.    . busPIRone (BUSPAR) 10 MG tablet Take 1 tablet (10 mg total) by mouth 2 (two) times daily. 60 tablet 1  . Levonorgestrel 19.5 MG IUD by Intrauterine route. Was place 11/17    . PARoxetine (PAXIL) 40 MG tablet Take 1 tablet (40 mg total) by mouth daily. 30 tablet 1  . Prenatal Vit-Fe Fumarate-FA (PRENATAL MULTIVITAMIN) TABS tablet Take 1 tablet by mouth at bedtime.      No current facility-administered medications for this visit.      Musculoskeletal: Strength & Muscle Tone: within normal limits Gait & Station: normal Patient leans: N/A  Psychiatric Specialty Exam: Review of Systems  Gastrointestinal: Negative for abdominal pain, heartburn, nausea and vomiting.  Psychiatric/Behavioral: Negative for depression, hallucinations, substance abuse and suicidal ideas. The patient is nervous/anxious. The patient does not have insomnia.     Blood pressure 110/66, pulse 98, height 5\' 3"  (1.6 m), weight 130 lb (59 kg), currently breastfeeding.Body mass index is 23.03 kg/m.  General Appearance: Fairly Groomed  Eye Contact:  Good  Speech:  Clear and Coherent and Normal Rate  Volume:  Normal  Mood:  Anxious  Affect:  Congruent  Thought Process:  Goal Directed and Descriptions of Associations: Intact  Orientation:  Full (Time, Place, and Person)  Thought Content: Logical   Suicidal Thoughts:  No  Homicidal Thoughts:  No  Memory:  Immediate;   Good Recent;   Good Remote;   Good  Judgement:  Good  Insight:  Good  Psychomotor Activity:  Normal  Concentration:   Concentration: Good and Attention Span: Good  Recall:  Good  Fund of Knowledge: Good  Language: Good  Akathisia:  No  Handed:  Right  AIMS (if indicated): not done  Assets:  Communication Skills Desire for Improvement Housing Intimacy Leisure Time Social Support Talents/Skills Transportation  ADL's:  Intact  Cognition: WNL  Sleep:  Fair   Screenings: PHQ2-9     Counselor from 06/06/2015 in BEHAVIORAL HEALTH OUTPATIENT THERAPY [place]  PHQ-2 Total Score  4  PHQ-9 Total Score  13       Assessment and Plan: MDD-recurrent, mild- resolved; panic disorder without agoraphobia- ongoing; GAD-worsening; ADHD-inattentive type    Medication management with supportive therapy. Risks/benefits and SE of the medication discussed. Pt verbalized understanding and verbal consent obtained  for treatment.  Affirm with the patient that the medications are taken as ordered. Patient expressed understanding of how their medications were to be used.   Meds: Paxil 40 mg p.o. daily for MDD and panic disorder Increase Buspar 15 mg p.o. twice daily for panic disorder and mood augmentation Pt will consider trial of Abilify in the future Pt continues to breastfeed but is willing to wean if needed.   Labs: none  Therapy: brief supportive therapy provided. Discussed psychosocial stressors in detail.     Consultations: none  Pt denies SI and is at an acute low risk for suicide. Patient told to call clinic if any problems occur. Patient advised to go to ER if they should develop SI/HI, side effects, or if symptoms worsen. Has crisis numbers to call if needed. Pt verbalized understanding.  F/up in 2 months or sooner if needed    Oletta Darter, MD 10/28/2017, 10:48 AM

## 2017-12-30 ENCOUNTER — Ambulatory Visit (HOSPITAL_COMMUNITY): Payer: 59 | Admitting: Psychiatry

## 2018-06-29 ENCOUNTER — Emergency Department (HOSPITAL_COMMUNITY): Payer: BLUE CROSS/BLUE SHIELD

## 2018-06-29 ENCOUNTER — Emergency Department (HOSPITAL_COMMUNITY)
Admission: EM | Admit: 2018-06-29 | Discharge: 2018-06-29 | Disposition: A | Discharge status: Home / Self Care | Payer: BLUE CROSS/BLUE SHIELD | Attending: Emergency Medicine | Admitting: Emergency Medicine

## 2018-06-29 ENCOUNTER — Encounter (HOSPITAL_COMMUNITY): Payer: Self-pay | Admitting: Emergency Medicine

## 2018-06-29 DIAGNOSIS — Z87891 Personal history of nicotine dependence: Secondary | ICD-10-CM | POA: Insufficient documentation

## 2018-06-29 DIAGNOSIS — Z79899 Other long term (current) drug therapy: Secondary | ICD-10-CM | POA: Insufficient documentation

## 2018-06-29 DIAGNOSIS — F913 Oppositional defiant disorder: Secondary | ICD-10-CM | POA: Insufficient documentation

## 2018-06-29 DIAGNOSIS — F419 Anxiety disorder, unspecified: Secondary | ICD-10-CM | POA: Insufficient documentation

## 2018-06-29 DIAGNOSIS — F329 Major depressive disorder, single episode, unspecified: Secondary | ICD-10-CM | POA: Insufficient documentation

## 2018-06-29 DIAGNOSIS — E876 Hypokalemia: Secondary | ICD-10-CM | POA: Insufficient documentation

## 2018-06-29 DIAGNOSIS — J209 Acute bronchitis, unspecified: Secondary | ICD-10-CM | POA: Insufficient documentation

## 2018-06-29 DIAGNOSIS — R05 Cough: Secondary | ICD-10-CM | POA: Diagnosis present

## 2018-06-29 LAB — CBC WITH DIFFERENTIAL/PLATELET
Abs Immature Granulocytes: 0 10*3/uL (ref 0.0–0.1)
BASOS ABS: 0.1 10*3/uL (ref 0.0–0.1)
BASOS PCT: 1 %
EOS ABS: 0.5 10*3/uL (ref 0.0–0.7)
Eosinophils Relative: 6 %
HCT: 47.5 % — ABNORMAL HIGH (ref 36.0–46.0)
Hemoglobin: 15.9 g/dL — ABNORMAL HIGH (ref 12.0–15.0)
Immature Granulocytes: 0 %
Lymphocytes Relative: 44 %
Lymphs Abs: 4.2 10*3/uL — ABNORMAL HIGH (ref 0.7–4.0)
MCH: 28.6 pg (ref 26.0–34.0)
MCHC: 33.5 g/dL (ref 30.0–36.0)
MCV: 85.6 fL (ref 78.0–100.0)
MONOS PCT: 7 %
Monocytes Absolute: 0.6 10*3/uL (ref 0.1–1.0)
Neutro Abs: 3.9 10*3/uL (ref 1.7–7.7)
Neutrophils Relative %: 42 %
Platelets: 383 10*3/uL (ref 150–400)
RBC: 5.55 MIL/uL — ABNORMAL HIGH (ref 3.87–5.11)
RDW: 12.4 % (ref 11.5–15.5)
WBC: 9.4 10*3/uL (ref 4.0–10.5)

## 2018-06-29 LAB — COMPREHENSIVE METABOLIC PANEL
ALK PHOS: 79 U/L (ref 38–126)
ALT: 7 U/L (ref 0–44)
AST: 20 U/L (ref 15–41)
Albumin: 4.7 g/dL (ref 3.5–5.0)
Anion gap: 15 (ref 5–15)
BILIRUBIN TOTAL: 0.8 mg/dL (ref 0.3–1.2)
BUN: 10 mg/dL (ref 6–20)
CALCIUM: 9.9 mg/dL (ref 8.9–10.3)
CO2: 22 mmol/L (ref 22–32)
CREATININE: 1.01 mg/dL — AB (ref 0.44–1.00)
Chloride: 103 mmol/L (ref 98–111)
GFR calc Af Amer: >60 mL/min (ref >60)
Glucose, Bld: 107 mg/dL — ABNORMAL HIGH (ref 70–99)
Potassium: 3.3 mmol/L — ABNORMAL LOW (ref 3.5–5.1)
SODIUM: 140 mmol/L (ref 135–145)
TOTAL PROTEIN: 7.7 g/dL (ref 6.5–8.1)

## 2018-06-29 LAB — I-STAT BETA HCG BLOOD, ED (MC, WL, AP ONLY): I-stat hCG, quantitative: <5 m[IU]/mL (ref <5)

## 2018-06-29 LAB — I-STAT CG4 LACTIC ACID, ED: Lactic Acid, Venous: 2.71 mmol/L (ref 0.5–1.9)

## 2018-06-29 MED ORDER — SODIUM CHLORIDE 0.9 % IV BOLUS
1000.0000 mL | Freq: Once | INTRAVENOUS | Status: AC
  Administered 2018-06-29: 1000 mL via INTRAVENOUS

## 2018-06-29 MED ORDER — PREDNISONE 20 MG PO TABS
60.0000 mg | ORAL_TABLET | Freq: Once | ORAL | Status: AC
  Administered 2018-06-29: 60 mg via ORAL
  Filled 2018-06-29: qty 3

## 2018-06-29 MED ORDER — IPRATROPIUM-ALBUTEROL 0.5-2.5 (3) MG/3ML IN SOLN
3.0000 mL | Freq: Once | RESPIRATORY_TRACT | Status: AC
  Administered 2018-06-29: 3 mL via RESPIRATORY_TRACT
  Filled 2018-06-29: qty 3

## 2018-06-29 MED ORDER — ALBUTEROL SULFATE HFA 108 (90 BASE) MCG/ACT IN AERS: 2.0000 | INHALATION_SPRAY | RESPIRATORY_TRACT | 0 refills | Status: DC | PRN

## 2018-06-29 MED ORDER — AZITHROMYCIN 250 MG PO TABS: ORAL_TABLET | ORAL | 0 refills | Status: DC

## 2018-06-29 MED ORDER — ALBUTEROL SULFATE (2.5 MG/3ML) 0.083% IN NEBU
5.0000 mg | INHALATION_SOLUTION | Freq: Once | RESPIRATORY_TRACT | Status: AC
  Administered 2018-06-29: 5 mg via RESPIRATORY_TRACT
  Filled 2018-06-29: qty 6

## 2018-06-29 MED ORDER — PREDNISONE 10 MG (21) PO TBPK: ORAL_TABLET | ORAL | 0 refills | Status: DC

## 2018-06-29 NOTE — ED Notes (Signed)
Declined W/C at D/C and was escorted to lobby by RN. 

## 2018-06-29 NOTE — Discharge Instructions (Addendum)
Your symptoms are likely consistent with a viral illness. Viruses do not require or respond to antibiotics. Treatment is symptomatic care and it is important to note that these symptoms may last for 7-14 days.   Hand washing: Wash your hands throughout the day, but especially before and after touching the face, using the restroom, sneezing, coughing, or touching surfaces that have been coughed or sneezed upon. Hydration: Symptoms will be intensified and complicated by dehydration. Dehydration can also extend the duration of symptoms. Drink plenty of fluids and get plenty of rest. You should be drinking at least half a liter of water an hour to stay hydrated. Electrolyte drinks (ex. Gatorade, Powerade, Pedialyte) are also encouraged. You should be drinking enough fluids to make your urine light yellow, almost clear. If this is not the case, you are not drinking enough water. Please note that some of the treatments indicated below will not be effective if you are not adequately hydrated. Pain or fever: Ibuprofen, Naproxen, or acetaminophen (generic for Tylenol) for pain or fever.  Albuterol: May use the albuterol as needed for instances of shortness of breath. Prednisone: Take the prednisone, as directed, in its entirety. Zyrtec or Claritin: May add these medication daily to control underlying symptoms of congestion, sneezing, and other signs of allergies.  These medications are available over-the-counter. Generics: Cetirizine (generic for Zyrtec) and loratadine (generic for Claritin). Fluticasone: Use fluticasone (generic for Flonase), as directed, for nasal and sinus congestion.  This medication is available over-the-counter. Congestion: Plain guaifenesin (generic for plain Mucinex) may help relieve congestion. Saline sinus rinses and saline nasal sprays may also help relieve congestion. If you do not have heart problems or an allergy to such medications, you may also try phenylephrine or Sudafed. Follow  up: Follow up with a primary care provider within the next two weeks should symptoms fail to resolve. Return: Return to the ED for significantly worsening symptoms, shortness of breath, persistent vomiting, or any other major concerns.  You may start the antibiotics if symptoms are not starting to improve within the next few days.  Your potassium was lower than normal today.  Please follow-up with your primary care provider on this matter.  For prescription assistance, may try using prescription discount sites or apps, such as goodrx.com

## 2018-06-29 NOTE — ED Triage Notes (Signed)
Patient to ED from [facility] for further evaluation of cough x 5 days. Expiratory wheezing auscultated bilaterally. She endorses shortness of breath that is worse with exertion, chest soreness/tightness with cough. Denies fevers but has been having chills/sweats. Resp e/u at rest, skin warm/dry.

## 2018-06-29 NOTE — ED Provider Notes (Signed)
[facility] EMERGENCY DEPARTMENT Provider Note   CSN: [id number] Arrival date & time: 06/29/18  1203     History   Chief Complaint Chief Complaint  Patient presents with  . Cough    HPI Allison Bernard is a 22 y.o. female.  HPI   Allison Bernard is a 22 y.o. female, with a history of anemia, presenting to the ED with intermittently productive cough for the past.  Yellow sputum.  Accompanied by some shortness of breath, usually with exertion, as well as chills.  Occasional chest soreness with coughing. Denies known fever, N/V/D, abdominal pain, syncope, sore throat, upper respiratory congestion, rash, or any other complaints.     Past Medical History:  Diagnosis Date  . ADD (attention deficit disorder)   . Anemia   . Anxiety   . Depression    no meds, doing ok  . Ectopic pregnancy   . Ectopic pregnancy    received methotrexate  . Headache(784.0)    had migraines when she was younger  . Infection    UTI  . Kidney infection   . 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 Normal pregnancy in third trimester 06/09/2016  . Oppositional defiant disorder   . PAC (premature atrial contraction) 01/29/2016  . Palpitations 01/03/2016  . Pregnant   . PVC (premature ventricular contraction) 01/29/2016  . SVD (spontaneous vaginal delivery) 06/10/2016    Patient Active Problem List   Diagnosis Date Noted  . Normal pregnancy 06/10/2016  . SVD (spontaneous vaginal delivery) 06/10/2016  . Normal pregnancy in third trimester 06/09/2016  . PAC (premature atrial contraction) 01/29/2016  . PVC (premature ventricular contraction) 01/29/2016  . Palpitations 01/03/2016  . Panic disorder without agoraphobia 08/21/2014  . Major depressive disorder, recurrent episode, moderate (HCC) 08/21/2014  . GAD (generalized anxiety disorder) 01/01/2012  . 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 Depressive disorder, not elsewhere classified 11/02/2011  . Anxiety state,  unspecified 11/02/2011    Past Surgical History:  Procedure Laterality Date  . ANKLE SURGERY Left    reconstruction   . MASS EXCISION Left 07/04/2013   Procedure: EXCISION MASS;  Surgeon: Nadara Mustard, MD;  Location: [facility] OR;  Service: Orthopedics;  Laterality: Left;  Excision Left Foot Talo-calcaneous fibrous bar  . tubes in ears     in the past  . WISDOM TOOTH EXTRACTION       OB History    Gravida  2   Para      Term      Preterm      AB  1   Living  1     SAB      TAB      Ectopic  1   Multiple  0   Live Births               Home Medications    Prior to Admission medications   Medication Sig Start Date End Date Taking? Authorizing Provider  acetaminophen (TYLENOL) 325 MG tablet Take by mouth every 6 (six) hours as needed for moderate pain or headache.    [provider]  albuterol (PROVENTIL HFA;VENTOLIN HFA) 108 (90 Base) MCG/ACT inhaler Inhale 2 puffs into the lungs every 4 (four) hours as needed for wheezing or shortness of breath. 06/29/18   Joy, Shawn C, PA-C  azithromycin (ZITHROMAX) 250 MG tablet Take first 2 tablets together on the first day, then starting on day two, take 1 every day until finished. 06/29/18  Joy, Shawn C, PA-C  busPIRone (BUSPAR) 15 MG tablet Take 1 tablet (15 mg total) by mouth 2 (two) times daily. 10/28/17   Oletta Darter, MD  Levonorgestrel 19.5 MG IUD by Intrauterine route. Was place 11/17    [provider]  PARoxetine (PAXIL) 40 MG tablet Take 1 tablet (40 mg total) by mouth daily. 10/28/17 10/28/18  Oletta Darter, MD  predniSONE (STERAPRED UNI-PAK 21 TAB) 10 MG (21) TBPK tablet Take 6 tabs (60mg ) day 1, 5 tabs (50mg ) day 2, 4 tabs (40mg ) day 3, 3 tabs (30mg ) day 4, 2 tabs (20mg ) day 5, and 1 tab (10mg ) day 6. 06/29/18   Joy, Shawn C, PA-C  Prenatal Vit-Fe Fumarate-FA (PRENATAL MULTIVITAMIN) TABS tablet Take 1 tablet by mouth at bedtime.     [provider]    Family History Family History    Problem Relation Age of Onset  . Depression Mother   . Anxiety disorder Mother   . Hypertension Father   . Diabetes Father   . Heart failure Maternal Grandmother   . Hypertension Maternal Grandmother   . Cancer Maternal Grandmother        4 types  . Diabetes Maternal Grandmother   . Hypertension Maternal Grandfather   . Mesothelioma Maternal Grandfather   . Cancer Paternal Grandmother        uterine    Social History Social History   Tobacco Use  . Smoking status: Former Smoker    Last attempt to quit: 09/28/2015    Years since quitting: 2.7  . Smokeless tobacco: Never Used  . Tobacco comment: quit prior to preg  Substance Use Topics  . Alcohol use: No    Comment: 1 drink a month  . Drug use: No     Allergies   Patient has no known allergies.   Review of Systems Review of Systems  Constitutional: Positive for chills. Negative for fever.  HENT: Negative for congestion, ear pain, rhinorrhea, sore throat and trouble swallowing.   Respiratory: Positive for cough and shortness of breath.   Cardiovascular: Negative for leg swelling.  Gastrointestinal: Negative for abdominal pain, diarrhea, nausea and vomiting.  Musculoskeletal: Negative for neck pain and neck stiffness.  Skin: Negative for rash.  All other systems reviewed and are negative.    Physical Exam Updated Vital Signs BP (!) 139/93 (BP Location: Right Arm)   Pulse (!) 110   Temp 98.4 F (36.9 C) (Oral)   Resp 18   Ht 5\' 3"  (1.6 m)   Wt 57.6 kg   LMP 05/12/2018 (Exact Date)   SpO2 96%   BMI 22.50 kg/m   Physical Exam  Constitutional: She appears well-developed and well-nourished. No distress.  HENT:  Head: Normocephalic and atraumatic.  Eyes: Conjunctivae are normal.  Neck: Neck supple.  Cardiovascular: Normal rate, regular rhythm, normal heart sounds and intact distal pulses.  Pulmonary/Chest: She has wheezes. She has rhonchi.  Wheezes and rhonchi globally.  No increased work of breathing or  tachypnea, unless she is walking or exerting herself.  Abdominal: Soft. There is no tenderness. There is no guarding.  Musculoskeletal: She exhibits no edema.  Lymphadenopathy:    She has no cervical adenopathy.  Neurological: She is alert.  Skin: Skin is warm and dry. She is not diaphoretic.  Psychiatric: She has a normal mood and affect. Her behavior is normal.  Nursing note and vitals reviewed.    ED Treatments / Results  Labs (all labs ordered are listed, but only abnormal results  are displayed) Labs Reviewed  COMPREHENSIVE METABOLIC PANEL - Abnormal; Notable for the following components:      Result Value   Potassium 3.3 (*)    Glucose, Bld 107 (*)    Creatinine, Ser 1.01 (*)    All other components within normal limits  CBC WITH DIFFERENTIAL/PLATELET - Abnormal; Notable for the following components:   RBC 5.55 (*)    Hemoglobin 15.9 (*)    HCT 47.5 (*)    Lymphs Abs 4.2 (*)    All other components within normal limits  I-STAT CG4 LACTIC ACID, ED - Abnormal; Notable for the following components:   Lactic Acid, Venous 2.71 (*)    All other components within normal limits  I-STAT BETA HCG BLOOD, ED ([facility], [facility], [facility] ONLY)  I-STAT CG4 LACTIC ACID, ED    EKG EKG Interpretation  Date/Time:  Wednesday June 29 2018 12:08:07 EDT Ventricular Rate:  113 PR Interval:  148 QRS Duration: 80 QT Interval:  342 QTC Calculation: 469 R Axis:   90 Text Interpretation:  Sinus tachycardia Rightward axis ST & T wave abnormality, consider inferior ischemia ST & T wave abnormality, consider anterolateral ischemia Abnormal ECG T wave inversions more pronounced but present on previous tracings Confirmed by Frederick Peers [phone]) on 06/29/2018 4:54:35 PM   Radiology Dg Chest 2 View  Result Date: 06/29/2018 CLINICAL DATA:  Cough and short of breath EXAM: CHEST - 2 VIEW COMPARISON:  Chest radiograph September 08, 2015 and chest CT September 08, 2015 FINDINGS: Lungs are clear. The heart size and  pulmonary vascularity are normal. No adenopathy. There is midthoracic dextroscoliosis. IMPRESSION: No edema or consolidation. Electronically Signed   By: Bretta Bang III M.D.   On: 06/29/2018 12:48    Procedures Angiocath insertion Date/Time: 06/29/2018 1:12 PM Performed by: Anselm Pancoast, PA-C Authorized by: Anselm Pancoast, PA-C  Consent: Verbal consent obtained. Risks and benefits: risks, benefits and alternatives were discussed Consent given by: patient Patient understanding: patient states understanding of the procedure being performed Patient consent: the patient's understanding of the procedure matches consent given Procedure consent: procedure consent matches procedure scheduled Patient identity confirmed: verbally with patient Local anesthesia used: no  Anesthesia: Local anesthesia used: no  Sedation: Patient sedated: no  Patient tolerance: Patient tolerated the procedure well with no immediate complications Comments: 20-gauge IV placed in the left forearm.  Positive blood return.  Flows easily without swelling, pain, or other signs of infiltration.    (including critical care time)  Medications Ordered in ED Medications  albuterol (PROVENTIL) (2.5 MG/3ML) 0.083% nebulizer solution 5 mg (5 mg Nebulization Given 06/29/18 1217)  ipratropium-albuterol (DUONEB) 0.5-2.5 (3) MG/3ML nebulizer solution 3 mL (3 mLs Nebulization Given 06/29/18 1332)  predniSONE (DELTASONE) tablet 60 mg (60 mg Oral Given 06/29/18 1332)  sodium chloride 0.9 % bolus 1,000 mL (0 mLs Intravenous Stopped 06/29/18 1457)     Initial Impression / Assessment and Plan / ED Course  I have reviewed the triage vital signs and the nursing notes.  Pertinent labs & imaging results that were available during my care of the patient were reviewed by me and considered in my medical decision making (see chart for details).  Clinical Course as of Jun 29 1699  Wed Jun 29, 2018  1330 Suspect the patient's heart rate  is contributing to the abnormalities noted.  ED EKG [SJ]  1414 Better than previous values.  Potassium(!): 3.3 [SJ]    Clinical Course User Index [SJ] Joy, Shawn C, PA-C  Patient presents with persistent cough and chills.  Patient showed marked improvement during ED course.  Rather mild lactic acid elevation, combined with mildly elevated creatinine, give suspicion for dehydration.  Patient's initial tachycardia improved with IV fluids.  We discussed addressing her mild hypokalemia here in the ED as well as discussing a repeat lactic acid.  Patient preferred discharge and states she will follow-up with her PCP on her hypokalemia. The patient was given instructions for home care as well as return precautions. Patient voices understanding of these instructions, accepts the plan, and is comfortable with discharge.  Vitals:   06/29/18 1211 06/29/18 1212 06/29/18 1411  BP: (!) 139/93  104/74  Pulse: (!) 110  83  Resp: 18  14  Temp: 98.4 F (36.9 C)    TempSrc: Oral    SpO2: 96%  97%  Weight:  57.6 kg   Height:  5\' 3"  (1.6 m)      Final Clinical Impressions(s) / ED Diagnoses   Final diagnoses:  Acute bronchitis, unspecified organism  Hypokalemia    ED Discharge Orders         Ordered    predniSONE (STERAPRED UNI-PAK 21 TAB) 10 MG (21) TBPK tablet     06/29/18 1410    albuterol (PROVENTIL HFA;VENTOLIN HFA) 108 (90 Base) MCG/ACT inhaler  Every 4 hours PRN     06/29/18 1410    azithromycin (ZITHROMAX) 250 MG tablet     06/29/18 1410           Anselm Pancoast, PA-C 06/29/18 1700    Little, Ambrose Finland, MD 06/30/18 [phone]

## 2018-07-11 ENCOUNTER — Other Ambulatory Visit (HOSPITAL_COMMUNITY): Payer: Self-pay | Admitting: Psychiatry

## 2018-07-11 DIAGNOSIS — F330 Major depressive disorder, recurrent, mild: Secondary | ICD-10-CM

## 2018-07-11 DIAGNOSIS — F411 Generalized anxiety disorder: Secondary | ICD-10-CM

## 2018-07-11 DIAGNOSIS — F410 Panic disorder [episodic paroxysmal anxiety] without agoraphobia: Secondary | ICD-10-CM

## 2018-07-21 ENCOUNTER — Telehealth (HOSPITAL_COMMUNITY): Payer: Self-pay

## 2018-07-21 NOTE — Telephone Encounter (Signed)
She is not my patient.  Please contact Dr. Michae Kava.

## 2018-07-21 NOTE — Telephone Encounter (Signed)
Patient is calling for a refill on her Paxil, she has an appointment for November - she missed her last one because she did not have insurance. She wants to know if she can get the refill, please review and advise, thank you

## 2018-07-25 ENCOUNTER — Emergency Department: Payer: BLUE CROSS/BLUE SHIELD

## 2018-07-25 ENCOUNTER — Encounter
Admission: EM | Disposition: A | Discharge status: Home / Self Care | Payer: Self-pay | Source: Home / Self Care | Attending: Emergency Medicine

## 2018-07-25 ENCOUNTER — Observation Stay: Payer: BLUE CROSS/BLUE SHIELD | Admitting: Anesthesiology

## 2018-07-25 ENCOUNTER — Encounter: Payer: Self-pay | Admitting: Intensive Care

## 2018-07-25 ENCOUNTER — Other Ambulatory Visit: Payer: Self-pay

## 2018-07-25 ENCOUNTER — Observation Stay
Admission: EM | Admit: 2018-07-25 | Discharge: 2018-07-26 | Disposition: A | Discharge status: Home / Self Care | Payer: BLUE CROSS/BLUE SHIELD | Attending: General Surgery | Admitting: General Surgery

## 2018-07-25 DIAGNOSIS — K3533 Acute appendicitis with perforation and localized peritonitis, with abscess: Secondary | ICD-10-CM

## 2018-07-25 DIAGNOSIS — Z833 Family history of diabetes mellitus: Secondary | ICD-10-CM | POA: Insufficient documentation

## 2018-07-25 DIAGNOSIS — D649 Anemia, unspecified: Secondary | ICD-10-CM | POA: Diagnosis not present

## 2018-07-25 DIAGNOSIS — F411 Generalized anxiety disorder: Secondary | ICD-10-CM | POA: Diagnosis not present

## 2018-07-25 DIAGNOSIS — Z8049 Family history of malignant neoplasm of other genital organs: Secondary | ICD-10-CM | POA: Diagnosis not present

## 2018-07-25 DIAGNOSIS — Z818 Family history of other mental and behavioral disorders: Secondary | ICD-10-CM | POA: Insufficient documentation

## 2018-07-25 DIAGNOSIS — F17290 Nicotine dependence, other tobacco product, uncomplicated: Secondary | ICD-10-CM | POA: Insufficient documentation

## 2018-07-25 DIAGNOSIS — F988 Other specified behavioral and emotional disorders with onset usually occurring in childhood and adolescence: Secondary | ICD-10-CM | POA: Insufficient documentation

## 2018-07-25 DIAGNOSIS — Z8249 Family history of ischemic heart disease and other diseases of the circulatory system: Secondary | ICD-10-CM | POA: Insufficient documentation

## 2018-07-25 DIAGNOSIS — K3532 Acute appendicitis with perforation and localized peritonitis, without abscess: Principal | ICD-10-CM | POA: Insufficient documentation

## 2018-07-25 DIAGNOSIS — R002 Palpitations: Secondary | ICD-10-CM | POA: Diagnosis not present

## 2018-07-25 DIAGNOSIS — Z809 Family history of malignant neoplasm, unspecified: Secondary | ICD-10-CM | POA: Diagnosis not present

## 2018-07-25 DIAGNOSIS — F913 Oppositional defiant disorder: Secondary | ICD-10-CM | POA: Diagnosis not present

## 2018-07-25 DIAGNOSIS — I493 Ventricular premature depolarization: Secondary | ICD-10-CM | POA: Insufficient documentation

## 2018-07-25 DIAGNOSIS — F329 Major depressive disorder, single episode, unspecified: Secondary | ICD-10-CM | POA: Insufficient documentation

## 2018-07-25 DIAGNOSIS — I491 Atrial premature depolarization: Secondary | ICD-10-CM | POA: Insufficient documentation

## 2018-07-25 DIAGNOSIS — Z79899 Other long term (current) drug therapy: Secondary | ICD-10-CM | POA: Diagnosis not present

## 2018-07-25 DIAGNOSIS — K353 Acute appendicitis with localized peritonitis, without perforation or gangrene: Secondary | ICD-10-CM | POA: Diagnosis present

## 2018-07-25 DIAGNOSIS — Z7951 Long term (current) use of inhaled steroids: Secondary | ICD-10-CM | POA: Diagnosis not present

## 2018-07-25 HISTORY — PX: LAPAROSCOPIC APPENDECTOMY: SHX408

## 2018-07-25 LAB — COMPREHENSIVE METABOLIC PANEL
ALBUMIN: 4.5 g/dL (ref 3.5–5.0)
ALK PHOS: 71 U/L (ref 38–126)
ALT: 5 U/L (ref 0–44)
ANION GAP: 9 (ref 5–15)
AST: 12 U/L — ABNORMAL LOW (ref 15–41)
BUN: 13 mg/dL (ref 6–20)
CALCIUM: 9.3 mg/dL (ref 8.9–10.3)
CO2: 25 mmol/L (ref 22–32)
Chloride: 103 mmol/L (ref 98–111)
Creatinine, Ser: 0.87 mg/dL (ref 0.44–1.00)
GFR calc non Af Amer: >60 mL/min (ref >60)
GLUCOSE: 95 mg/dL (ref 70–99)
POTASSIUM: 3.7 mmol/L (ref 3.5–5.1)
SODIUM: 137 mmol/L (ref 135–145)
Total Bilirubin: 1.6 mg/dL — ABNORMAL HIGH (ref 0.3–1.2)
Total Protein: 7.9 g/dL (ref 6.5–8.1)

## 2018-07-25 LAB — LIPASE, BLOOD: Lipase: 23 U/L (ref 11–51)

## 2018-07-25 LAB — URINALYSIS, COMPLETE (UACMP) WITH MICROSCOPIC
BILIRUBIN URINE: NEGATIVE
GLUCOSE, UA: NEGATIVE mg/dL
KETONES UR: 5 mg/dL — AB
Nitrite: NEGATIVE
PROTEIN: NEGATIVE mg/dL
Specific Gravity, Urine: 1.011 (ref 1.005–1.030)
pH: 7 (ref 5.0–8.0)

## 2018-07-25 LAB — CBC
HCT: 42.2 % (ref 36.0–46.0)
HEMOGLOBIN: 14.1 g/dL (ref 12.0–15.0)
MCH: 28.5 pg (ref 26.0–34.0)
MCHC: 33.4 g/dL (ref 30.0–36.0)
MCV: 85.4 fL (ref 80.0–100.0)
NRBC: 0 % (ref 0.0–0.2)
PLATELETS: 315 10*3/uL (ref 150–400)
RBC: 4.94 MIL/uL (ref 3.87–5.11)
RDW: 12.2 % (ref 11.5–15.5)
WBC: 12.6 10*3/uL — ABNORMAL HIGH (ref 4.0–10.5)

## 2018-07-25 LAB — POCT PREGNANCY, URINE: Preg Test, Ur: NEGATIVE

## 2018-07-25 SURGERY — APPENDECTOMY, LAPAROSCOPIC
Anesthesia: General | Site: Abdomen

## 2018-07-25 MED ORDER — PROPOFOL 10 MG/ML IV BOLUS
INTRAVENOUS | Status: AC
  Filled 2018-07-25: qty 20

## 2018-07-25 MED ORDER — IOHEXOL 300 MG/ML  SOLN
75.0000 mL | Freq: Once | INTRAMUSCULAR | Status: AC | PRN
  Administered 2018-07-25: 75 mL via INTRAVENOUS

## 2018-07-25 MED ORDER — DEXAMETHASONE SODIUM PHOSPHATE 10 MG/ML IJ SOLN
INTRAMUSCULAR | Status: DC | PRN
  Administered 2018-07-25: 10 mg via INTRAVENOUS

## 2018-07-25 MED ORDER — NEOSTIGMINE METHYLSULFATE 10 MG/10ML IV SOLN
INTRAVENOUS | Status: AC
  Filled 2018-07-25: qty 1

## 2018-07-25 MED ORDER — MORPHINE SULFATE (PF) 2 MG/ML IV SOLN
2.0000 mg | INTRAVENOUS | Status: DC | PRN
  Administered 2018-07-25: 2 mg via INTRAVENOUS
  Filled 2018-07-25: qty 1

## 2018-07-25 MED ORDER — SUCCINYLCHOLINE CHLORIDE 20 MG/ML IJ SOLN
INTRAMUSCULAR | Status: AC
  Filled 2018-07-25: qty 1

## 2018-07-25 MED ORDER — ROCURONIUM BROMIDE 50 MG/5ML IV SOLN
INTRAVENOUS | Status: AC
  Filled 2018-07-25: qty 1

## 2018-07-25 MED ORDER — BUPIVACAINE-EPINEPHRINE 0.5% -1:200000 IJ SOLN
INTRAMUSCULAR | Status: DC | PRN
  Administered 2018-07-25: 20 mL

## 2018-07-25 MED ORDER — PROMETHAZINE HCL 25 MG/ML IJ SOLN: 6.2500 mg | INTRAMUSCULAR | Status: DC | PRN

## 2018-07-25 MED ORDER — ACETAMINOPHEN 325 MG PO TABS
650.0000 mg | ORAL_TABLET | Freq: Four times a day (QID) | ORAL | Status: DC | PRN
  Administered 2018-07-26: 650 mg via ORAL
  Filled 2018-07-25: qty 2

## 2018-07-25 MED ORDER — KETOROLAC TROMETHAMINE 30 MG/ML IJ SOLN
INTRAMUSCULAR | Status: DC | PRN
  Administered 2018-07-25: 30 mg via INTRAVENOUS

## 2018-07-25 MED ORDER — METRONIDAZOLE IN NACL 5-0.79 MG/ML-% IV SOLN
500.0000 mg | Freq: Three times a day (TID) | INTRAVENOUS | Status: DC
  Administered 2018-07-25 – 2018-07-26 (×2): 500 mg via INTRAVENOUS
  Filled 2018-07-25 (×3): qty 100

## 2018-07-25 MED ORDER — GLYCOPYRROLATE 0.2 MG/ML IJ SOLN
INTRAMUSCULAR | Status: AC
  Filled 2018-07-25: qty 1

## 2018-07-25 MED ORDER — MIDAZOLAM HCL 2 MG/2ML IJ SOLN
INTRAMUSCULAR | Status: AC
  Filled 2018-07-25: qty 2

## 2018-07-25 MED ORDER — PIPERACILLIN-TAZOBACTAM 3.375 G IVPB 30 MIN
3.3750 g | Freq: Once | INTRAVENOUS | Status: AC
  Administered 2018-07-25: 3.375 g via INTRAVENOUS
  Filled 2018-07-25: qty 50

## 2018-07-25 MED ORDER — SUCCINYLCHOLINE CHLORIDE 20 MG/ML IJ SOLN
INTRAMUSCULAR | Status: DC | PRN
  Administered 2018-07-25: 100 mg via INTRAVENOUS

## 2018-07-25 MED ORDER — ENOXAPARIN SODIUM 40 MG/0.4ML ~~LOC~~ SOLN: 40.0000 mg | SUBCUTANEOUS | Status: DC

## 2018-07-25 MED ORDER — DEXAMETHASONE SODIUM PHOSPHATE 10 MG/ML IJ SOLN
INTRAMUSCULAR | Status: AC
  Filled 2018-07-25: qty 1

## 2018-07-25 MED ORDER — FENTANYL CITRATE (PF) 100 MCG/2ML IJ SOLN
INTRAMUSCULAR | Status: DC | PRN
  Administered 2018-07-25: 25 ug via INTRAVENOUS
  Administered 2018-07-25 (×2): 100 ug via INTRAVENOUS
  Administered 2018-07-25: 25 ug via INTRAVENOUS

## 2018-07-25 MED ORDER — BUPIVACAINE-EPINEPHRINE (PF) 0.5% -1:200000 IJ SOLN
INTRAMUSCULAR | Status: AC
  Filled 2018-07-25: qty 30

## 2018-07-25 MED ORDER — ONDANSETRON HCL 4 MG/2ML IJ SOLN
INTRAMUSCULAR | Status: AC
  Filled 2018-07-25: qty 2

## 2018-07-25 MED ORDER — SODIUM CHLORIDE 0.9 % IV SOLN
INTRAVENOUS | Status: DC
  Administered 2018-07-25 – 2018-07-26 (×2): via INTRAVENOUS

## 2018-07-25 MED ORDER — MIDAZOLAM HCL 2 MG/2ML IJ SOLN
INTRAMUSCULAR | Status: DC | PRN
  Administered 2018-07-25: 2 mg via INTRAVENOUS

## 2018-07-25 MED ORDER — LIDOCAINE HCL (PF) 2 % IJ SOLN
INTRAMUSCULAR | Status: AC
  Filled 2018-07-25: qty 10

## 2018-07-25 MED ORDER — ROCURONIUM BROMIDE 100 MG/10ML IV SOLN
INTRAVENOUS | Status: DC | PRN
  Administered 2018-07-25: 30 mg via INTRAVENOUS

## 2018-07-25 MED ORDER — ONDANSETRON HCL 4 MG/2ML IJ SOLN
INTRAMUSCULAR | Status: DC | PRN
  Administered 2018-07-25: 4 mg via INTRAVENOUS

## 2018-07-25 MED ORDER — PROPOFOL 10 MG/ML IV BOLUS
INTRAVENOUS | Status: DC | PRN
  Administered 2018-07-25: 150 mg via INTRAVENOUS

## 2018-07-25 MED ORDER — SUGAMMADEX SODIUM 200 MG/2ML IV SOLN
INTRAVENOUS | Status: AC
  Filled 2018-07-25: qty 2

## 2018-07-25 MED ORDER — METRONIDAZOLE IN NACL 5-0.79 MG/ML-% IV SOLN
INTRAVENOUS | Status: AC
  Filled 2018-07-25: qty 100

## 2018-07-25 MED ORDER — SUGAMMADEX SODIUM 200 MG/2ML IV SOLN
INTRAVENOUS | Status: DC | PRN
  Administered 2018-07-25: 200 mg via INTRAVENOUS

## 2018-07-25 MED ORDER — DEXMEDETOMIDINE HCL 200 MCG/2ML IV SOLN
INTRAVENOUS | Status: DC | PRN
  Administered 2018-07-25: 8 ug via INTRAVENOUS
  Administered 2018-07-25: 12 ug via INTRAVENOUS

## 2018-07-25 MED ORDER — DEXMEDETOMIDINE HCL IN NACL 200 MCG/50ML IV SOLN
INTRAVENOUS | Status: AC
  Filled 2018-07-25: qty 50

## 2018-07-25 MED ORDER — SODIUM CHLORIDE 0.9 % IV SOLN
INTRAVENOUS | Status: AC
  Filled 2018-07-25: qty 20

## 2018-07-25 MED ORDER — HYDROCODONE-ACETAMINOPHEN 5-325 MG PO TABS
1.0000 | ORAL_TABLET | ORAL | Status: DC | PRN
  Administered 2018-07-26 (×3): 2 via ORAL
  Filled 2018-07-25 (×3): qty 2

## 2018-07-25 MED ORDER — SODIUM CHLORIDE 0.9 % IV SOLN
2.0000 g | INTRAVENOUS | Status: DC
  Administered 2018-07-25: 2 g via INTRAVENOUS
  Filled 2018-07-25: qty 20

## 2018-07-25 MED ORDER — KETOROLAC TROMETHAMINE 30 MG/ML IJ SOLN
INTRAMUSCULAR | Status: AC
  Filled 2018-07-25: qty 1

## 2018-07-25 MED ORDER — HYDROMORPHONE HCL 1 MG/ML IJ SOLN: 0.2500 mg | INTRAMUSCULAR | Status: DC | PRN

## 2018-07-25 MED ORDER — FENTANYL CITRATE (PF) 250 MCG/5ML IJ SOLN
INTRAMUSCULAR | Status: AC
  Filled 2018-07-25: qty 5

## 2018-07-25 MED ORDER — ACETAMINOPHEN 650 MG RE SUPP: 650.0000 mg | Freq: Four times a day (QID) | RECTAL | Status: DC | PRN

## 2018-07-25 SURGICAL SUPPLY — 42 items
APPLIER CLIP LOGIC TI 5 (MISCELLANEOUS) ×3 IMPLANT
BLADE SURG SZ11 CARB STEEL (BLADE) ×3 IMPLANT
CANISTER SUCT 1200ML W/VALVE (MISCELLANEOUS) ×3 IMPLANT
CHLORAPREP W/TINT 26ML (MISCELLANEOUS) ×3 IMPLANT
COVER WAND RF STERILE (DRAPES) ×3 IMPLANT
CUTTER FLEX LINEAR 45M (STAPLE) ×3 IMPLANT
DERMABOND ADVANCED (GAUZE/BANDAGES/DRESSINGS) ×2
DERMABOND ADVANCED .7 DNX12 (GAUZE/BANDAGES/DRESSINGS) ×1 IMPLANT
ELECT REM PT RETURN 9FT ADLT (ELECTROSURGICAL) ×3
ELECTRODE REM PT RTRN 9FT ADLT (ELECTROSURGICAL) ×1 IMPLANT
ENDOLOOP SUT PDS II  0 18 (SUTURE) ×2
ENDOLOOP SUT PDS II 0 18 (SUTURE) ×1 IMPLANT
GLOVE BIO SURGEON STRL SZ 6.5 (GLOVE) ×6 IMPLANT
GLOVE BIO SURGEONS STRL SZ 6.5 (GLOVE) ×3
GLOVE BIOGEL PI IND STRL 7.5 (GLOVE) ×1 IMPLANT
GLOVE BIOGEL PI INDICATOR 7.5 (GLOVE) ×2
GOWN STRL REUS W/ TWL LRG LVL3 (GOWN DISPOSABLE) ×3 IMPLANT
GOWN STRL REUS W/TWL LRG LVL3 (GOWN DISPOSABLE) ×6
GRASPER SUT TROCAR 14GX15 (MISCELLANEOUS) ×3 IMPLANT
HANDLE YANKAUER SUCT BULB TIP (MISCELLANEOUS) ×3 IMPLANT
IRRIGATION STRYKERFLOW (MISCELLANEOUS) IMPLANT
IRRIGATOR STRYKERFLOW (MISCELLANEOUS)
IV NS 1000ML (IV SOLUTION) ×2
IV NS 1000ML BAXH (IV SOLUTION) ×1 IMPLANT
KIT TURNOVER KIT A (KITS) ×3 IMPLANT
LIGASURE LAP MARYLAND 5MM 37CM (ELECTROSURGICAL) IMPLANT
NEEDLE HYPO 22GX1.5 SAFETY (NEEDLE) ×3 IMPLANT
NEEDLE VERESS 14GA 120MM (NEEDLE) ×3 IMPLANT
NS IRRIG 500ML POUR BTL (IV SOLUTION) ×3 IMPLANT
PACK LAP CHOLECYSTECTOMY (MISCELLANEOUS) ×3 IMPLANT
POUCH ENDO CATCH 10MM SPEC (MISCELLANEOUS) ×3 IMPLANT
RELOAD 45 VASCULAR/THIN (ENDOMECHANICALS) ×3 IMPLANT
RELOAD STAPLE TA45 3.5 REG BLU (ENDOMECHANICALS) ×3 IMPLANT
SCISSORS METZENBAUM CVD 33 (INSTRUMENTS) ×3 IMPLANT
SPONGE GAUZE 2X2 8PLY STER LF (GAUZE/BANDAGES/DRESSINGS) ×1
SPONGE GAUZE 2X2 8PLY STRL LF (GAUZE/BANDAGES/DRESSINGS) ×2 IMPLANT
SUT MNCRL AB 4-0 PS2 18 (SUTURE) ×3 IMPLANT
SUT VICRYL PLUS ABS 0 54 (SUTURE) ×3 IMPLANT
TRAY FOLEY MTR SLVR 16FR STAT (SET/KITS/TRAYS/PACK) ×3 IMPLANT
TROCAR XCEL 12X100 BLDLESS (ENDOMECHANICALS) ×3 IMPLANT
TROCAR XCEL NON-BLD 5MMX100MML (ENDOMECHANICALS) ×6 IMPLANT
TUBING INSUFFLATION (TUBING) ×3 IMPLANT

## 2018-07-25 NOTE — ED Triage Notes (Signed)
Patient c/o abd pain X3-4 days that has continuously gotten worse. C/o pain when ambulating and tender to touch mainly in RLQ and middle lower abdomen. Was sent here by [facility] for possible appendicitis

## 2018-07-25 NOTE — ED Notes (Signed)
Surgeon at bedside.  

## 2018-07-25 NOTE — Plan of Care (Signed)
  Problem: Education: Goal: Verbalization of understanding the information provided will improve Outcome: Progressing   

## 2018-07-25 NOTE — Anesthesia Post-op Follow-up Note (Signed)
Anesthesia QCDR form completed.        

## 2018-07-25 NOTE — Transfer of Care (Signed)
Immediate Anesthesia Transfer of Care Note  Patient: Allison Bernard  Procedure(s) Performed: APPENDECTOMY LAPAROSCOPIC (N/A Abdomen)  Patient Location: PACU  Anesthesia Type:General  Level of Consciousness: awake, alert  and oriented  Airway & Oxygen Therapy: Patient Spontanous Breathing and Patient connected to nasal cannula oxygen  Post-op Assessment: Report given to RN and Post -op Vital signs reviewed and stable  Post vital signs: Reviewed and stable  Last Vitals:  Vitals Value Taken Time  BP 114/71 07/25/2018  8:49 PM  Temp    Pulse 94 07/25/2018  8:51 PM  Resp 18 07/25/2018  8:51 PM  SpO2 100 % 07/25/2018  8:51 PM  Vitals shown include unvalidated device data.  Last Pain:  Vitals:   07/25/18 1916  TempSrc:   PainSc: 8          Complications: No apparent anesthesia complications

## 2018-07-25 NOTE — H&P (Signed)
SURGICAL CONSULTATION NOTE   HISTORY OF PRESENT ILLNESS (HPI):  22 y.o. female presented to [facility] ED for evaluation of abdominal pain. Patient reports having abdominal pian since 3 days ago. Pain started generalized and then radiated and localized to the right lower quadrant. She had associated nausea and vomiting. Refers had one low grade fever today before coming to ED. Also refers has had diarrhea recently.   Surgery is consulted by Dr. Don Perking in this context for evaluation and management of acute appendicitis.  PAST MEDICAL HISTORY (PMH):  Past Medical History:  Diagnosis Date  . ADD (attention deficit disorder)   . Anemia   . Anxiety   . Depression    no meds, doing ok  . Ectopic pregnancy   . Ectopic pregnancy    received methotrexate  . Headache(784.0)    had migraines when she was younger  . Infection    UTI  . Kidney infection   . 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 Normal pregnancy in third trimester 06/09/2016  . Oppositional defiant disorder   . PAC (premature atrial contraction) 01/29/2016  . Palpitations 01/03/2016  . Pregnant   . PVC (premature ventricular contraction) 01/29/2016  . SVD (spontaneous vaginal delivery) 06/10/2016     PAST SURGICAL HISTORY [facility]):  Past Surgical History:  Procedure Laterality Date  . ANKLE SURGERY Left    reconstruction   . MASS EXCISION Left 07/04/2013   Procedure: EXCISION MASS;  Surgeon: Nadara Mustard, MD;  Location: [facility] OR;  Service: Orthopedics;  Laterality: Left;  Excision Left Foot Talo-calcaneous fibrous bar  . tubes in ears     in the past  . WISDOM TOOTH EXTRACTION       MEDICATIONS:  Prior to Admission medications   Medication Sig Start Date End Date Taking? Authorizing Provider  acetaminophen (TYLENOL) 325 MG tablet Take 325-650 mg by mouth every 6 (six) hours as needed for moderate pain or headache.    Yes [provider]  albuterol (PROVENTIL HFA;VENTOLIN HFA) 108 (90 Base) MCG/ACT inhaler Inhale 2 puffs into the lungs every  4 (four) hours as needed for wheezing or shortness of breath. 06/29/18  Yes Joy, Shawn C, PA-C  ibuprofen (ADVIL,MOTRIN) 200 MG tablet Take 400-800 mg by mouth every 6 (six) hours as needed for mild pain or moderate pain.   Yes [provider]  azithromycin (ZITHROMAX) 250 MG tablet Take first 2 tablets together on the first day, then starting on day two, take 1 every day until finished. Patient not taking: Reported on 07/25/2018 06/29/18   Joy, Ines Bloomer C, PA-C  busPIRone (BUSPAR) 15 MG tablet Take 1 tablet (15 mg total) by mouth 2 (two) times daily. Patient not taking: Reported on 07/25/2018 10/28/17   Oletta Darter, MD  Levonorgestrel 19.5 MG IUD by Intrauterine route. Was place 11/17    [provider]  PARoxetine (PAXIL) 40 MG tablet Take 1 tablet (40 mg total) by mouth daily. Patient not taking: Reported on 07/25/2018 10/28/17 10/28/18  Oletta Darter, MD  predniSONE (STERAPRED UNI-PAK 21 TAB) 10 MG (21) TBPK tablet Take 6 tabs (60mg ) day 1, 5 tabs (50mg ) day 2, 4 tabs (40mg ) day 3, 3 tabs (30mg ) day 4, 2 tabs (20mg ) day 5, and 1 tab (10mg ) day 6. Patient not taking: Reported on 07/25/2018 06/29/18   Anselm Pancoast, PA-C     ALLERGIES:  No Known Allergies   SOCIAL HISTORY:  Social History   Socioeconomic History  . Marital status: Single  Spouse name: Not on file  . Number of children: Not on file  . Years of education: Not on file  . Highest education level: Not on file  Occupational History  . Not on file  Social Needs  . Financial resource strain: Not on file  . Food insecurity:    Worry: Not on file    Inability: Not on file  . Transportation needs:    Medical: Not on file    Non-medical: Not on file  Tobacco Use  . Smoking status: Current Every Day Smoker    Types: E-cigarettes  . Smokeless tobacco: Never Used  . Tobacco comment: quit prior to preg  Substance and Sexual Activity  . Alcohol use: No    Comment: 1 drink a month  . Drug use: No  . Sexual  activity: Yes    Birth control/protection: IUD  Lifestyle  . Physical activity:    Days per week: Not on file    Minutes per session: Not on file  . Stress: Not on file  Relationships  . Social connections:    Talks on phone: Not on file    Gets together: Not on file    Attends religious service: Not on file    Active member of club or organization: Not on file    Attends meetings of clubs or organizations: Not on file    Relationship status: Not on file  . Intimate partner violence:    Fear of current or ex partner: Not on file    Emotionally abused: Not on file    Physically abused: Not on file    Forced sexual activity: Not on file  Other Topics Concern  . Not on file  Social History Narrative  . Not on file    The patient currently resides (home / rehab facility / nursing home): Home The patient normally is (ambulatory / bedbound): Ambulatory   FAMILY HISTORY:  Family History  Problem Relation Age of Onset  . Depression Mother   . Anxiety disorder Mother   . Hypertension Father   . Diabetes Father   . Heart failure Maternal Grandmother   . Hypertension Maternal Grandmother   . Cancer Maternal Grandmother        4 types  . Diabetes Maternal Grandmother   . Hypertension Maternal Grandfather   . Mesothelioma Maternal Grandfather   . Cancer Paternal Grandmother        uterine     REVIEW OF SYSTEMS:  Constitutional: denies weight loss, fever, chills, or sweats  Eyes: denies any other vision changes, history of eye injury  ENT: denies sore throat, hearing problems  Respiratory: denies shortness of breath, wheezing  Cardiovascular: denies chest pain, palpitations  Gastrointestinal: positive abdominal pain, N/V,  diarrhea Genitourinary: denies burning with urination or urinary frequency Musculoskeletal: denies any other joint pains or cramps  Skin: denies any other rashes or skin discolorations  Neurological: denies any other headache, dizziness, weakness   Psychiatric: denies any other depression, anxiety   All other review of systems were negative   VITAL SIGNS:  Temp:  [98.5 F (36.9 C)] 98.5 F (36.9 C) (10/28 1538) Pulse Rate:  [84-93] 93 (10/28 1845) Resp:  [14-18] 18 (10/28 1845) BP: (97-108)/(68-79) 97/68 (10/28 1845) SpO2:  [95 %-100 %] 100 % (10/28 1845) Weight:  [53.5 kg] 53.5 kg (10/28 1539)     Height: 5\' 3"  (160 cm) Weight: 53.5 kg BMI (Calculated): 20.91   INTAKE/OUTPUT:  This shift: No intake/output data recorded.  Last 2 shifts: @IOLAST2SHIFTS @   PHYSICAL EXAM:  Constitutional:  -- Normal body habitus  -- Awake, alert, and oriented x3  Eyes:  -- Pupils equally round and reactive to light  -- No scleral icterus  Ear, nose, and throat:  -- No jugular venous distension  Pulmonary:  -- No crackles  -- Equal breath sounds bilaterally -- Breathing non-labored at rest Cardiovascular:  -- S1, S2 present  -- No pericardial rubs Gastrointestinal:  -- Abdomen soft, tender on right lower quadrant, non-distended, no guarding or rebound tenderness -- No abdominal masses appreciated, pulsatile or otherwise  Musculoskeletal and Integumentary:  -- Wounds or skin discoloration: None appreciated -- Extremities: B/L UE and LE FROM, hands and feet warm, no edema  Neurologic:  -- Motor function: intact and symmetric -- Sensation: intact and symmetric   Labs:  CBC Latest Ref Rng & Units 07/25/2018 06/29/2018 06/12/2016  WBC 4.0 - 10.5 K/uL 12.6(H) 9.4 14.5(H)  Hemoglobin 12.0 - 15.0 g/dL 40.9 15.9(H) 8.0(L)  Hematocrit 36.0 - 46.0 % 42.2 47.5(H) 22.9(L)  Platelets 150 - 400 K/uL 315 383 155   CMP Latest Ref Rng & Units 07/25/2018 06/29/2018 04/27/2016  Glucose 70 - 99 mg/dL 95 811(B) 147(W)  BUN 6 - 20 mg/dL 13 10 <2(N)  Creatinine 0.44 - 1.00 mg/dL 5.62 1.30(Q) 6.57  Sodium 135 - 145 mmol/L 137 140 135  Potassium 3.5 - 5.1 mmol/L 3.7 3.3(L) 2.7(LL)  Chloride 98 - 111 mmol/L 103 103 106  CO2 22 - 32 mmol/L 25 22 23    Calcium 8.9 - 10.3 mg/dL 9.3 9.9 8.4(O)  Total Protein 6.5 - 8.1 g/dL 7.9 7.7 6.1(L)  Total Bilirubin 0.3 - 1.2 mg/dL 9.6(E) 0.8 0.4  Alkaline Phos 38 - 126 U/L 71 79 88  AST 15 - 41 U/L 12(L) 20 22  ALT 0 - 44 U/L 5 7 9(L)   Imaging studies:  EXAM: CT ABDOMEN AND PELVIS WITH CONTRAST  TECHNIQUE: Multidetector CT imaging of the abdomen and pelvis was performed using the standard protocol following bolus administration of intravenous contrast.  CONTRAST:  75mL OMNIPAQUE IOHEXOL 300 MG/ML  SOLN  COMPARISON:  CT scan of October 02, 2012.  FINDINGS: Lower chest: No acute abnormality.  Hepatobiliary: No focal liver abnormality is seen. No gallstones, gallbladder wall thickening, or biliary dilatation.  Pancreas: Unremarkable. No pancreatic ductal dilatation or surrounding inflammatory changes.  Spleen: Normal in size without focal abnormality.  Adrenals/Urinary Tract: Adrenal glands are unremarkable. Kidneys are normal, without renal calculi, focal lesion, or hydronephrosis. Bladder is unremarkable.  Stomach/Bowel: Stomach appears normal. There is no evidence of bowel obstruction. The appendix is enlarged with surrounding inflammation consistent with acute appendicitis.  Appendix: Location: Right lower quadrant.  Diameter: 10 mm.  Appendicolith: No.  Mucosal hyper-enhancement: No.  Extraluminal gas: No.  Periappendiceal collection: No.  Vascular/Lymphatic: No significant vascular findings are present. No enlarged abdominal or pelvic lymph nodes.  Reproductive: Intrauterine device is noted. No adnexal abnormality is noted.  Other: No abdominal wall hernia or abnormality. No abdominopelvic ascites.  Musculoskeletal: No acute or significant osseous findings.  IMPRESSION: Findings consistent with acute appendicitis.  No abscess is noted.   Electronically Signed   By: Lupita Raider, M.D.   On: 07/25/2018 17:04  Assessment/Plan:  22  y.o. female with acute appendicitis.  Patient with history, physical exam and images consistent with acute appendicitis. Patient oriented about diagnosis and surgical management as treatment. Patient oriented about goals of surgery and its risk including: bowel  injury, infection, abscess, bleeding, leak from cecum, intestinal adhesions, bowel obstruction, fistula, injury to the ureter among others.  Patient understood and agreed to proceed with surgery. Will admit patient, already started on antibiotic therapy, will give IV hydration since patient is NPO and schedule to OR.   Gae Gallop, MD

## 2018-07-25 NOTE — Anesthesia Procedure Notes (Signed)
Procedure Name: Intubation Performed by: Clinton Sawyer, CRNA Pre-anesthesia Checklist: Patient identified, Patient being monitored, Timeout performed, Emergency Drugs available and Suction available Patient Re-evaluated:Patient Re-evaluated prior to induction Oxygen Delivery Method: Circle system utilized Preoxygenation: Pre-oxygenation with 100% oxygen Induction Type: IV induction Ventilation: Mask ventilation without difficulty Laryngoscope Size: Mac and 3 Grade View: Grade I Tube type: Oral Tube size: 7.0 mm Number of attempts: 1 Airway Equipment and Method: Stylet Placement Confirmation: ETT inserted through vocal cords under direct vision,  positive ETCO2 and breath sounds checked- equal and bilateral Secured at: 21 cm Tube secured with: Tape Dental Injury: Teeth and Oropharynx as per pre-operative assessment

## 2018-07-25 NOTE — ED Notes (Signed)
Pt to OR via OR tech, all clothes removed and consent form signed per MD order.

## 2018-07-25 NOTE — ED Notes (Signed)
Report given to Raquel RN

## 2018-07-25 NOTE — Op Note (Signed)
Preoperative diagnosis: Acute appendicitis.  Postoperative diagnosis: Acute appendicitis  Procedure: Laparoscopic appendectomy.  Anesthesia: GETA  Surgeon: Dr. Hazle Quant, MD  Wound Classification: Contaminated  Indications: Patient is a 22 y.o. female  presented with right lower quadrant pain of 3 days duration duration, fever, elevated WBC. Computed tomography scan and physical examination were consistent with acute appendicitis.   Findings: 1. Acutely inflamed appendix 2. No peri-appendiceal abscess or phlegmon 3. Normal anatomy 4. Appendiceal artery ligated and divided with EndoGIA 5. Adequate hemostasis.   Description of procedure: The patient was placed on the operating table in the supine position. General anesthesia was induced. A time-out was completed verifying correct patient, procedure, site, positioning, and implant(s) and/or special equipment prior to beginning this procedure. A Foley catheter and orogastric tubes were placed. The abdomen was prepped and draped in the usual sterile fashion.  An incision was made in a natural skin line above the umbilicus. The fascia was elevated and the Veress needle inserted. Proper position was confirmed by aspiration and saline meniscus test. The abdomen was insufflated with carbon dioxide to a pressure of 15 mmHg. The patient tolerated insufflation well. A 12-mm optiview trocar was then inserted supraumbilically. The laparoscope was inserted and the abdomen inspected. No injuries from initial trocar placement were noted. Turbid fluid was noted in the right lower quadrant. Under direct visualization, an 5-mm trocar was inserted in the left lower quadrant lateral to the rectus muscle. A 5-mm port was then placed above the symphysis pubis on midline.  Care was taken to avoid injury to the bladder or inferior epigastric vessels. The table was placed in the Trendelenburg position with the right side elevated.  The cecum was gently grasped with  an endoscopic graspers and pulled toward (the left upper quadrant). An atraumatic grasper was then passed through the suprapubic port and omentum was dissected away until the appendix was identified. The appendix was then grasped and elevated. It was noted to be inflamed.  An endoscopic linear cutting stapler was then used to divide and staple the base of the appendix. It was reloaded with a vascular cartridge and the mesoappendix similarly divided.  The appendix was placed in an endoscopic retrieval bag and removed.  The appendiceal stump was inspected and hemostasis was assured. The mesoappendix was bleeding and it was controlled with an endoloop.   Secondary trocars were removed under direct vision. No bleeding was noted. The laparoscope was withdrawn and the umbilical trocar removed. The abdomen was allowed to collapse. All trocar sites greater than 5 mm were closed with Vicryl 0. The skin was closed with subcuticular sutures Monocryl 4-0 of and steristrips.  The patient tolerated the procedure well and was taken to the postanesthesia care unit in satisfactory condition.   Specimen: Appendix  Complications: None  Estimated Blood Loss: 15 mL

## 2018-07-25 NOTE — ED Notes (Signed)
Report given to RN in surgery

## 2018-07-25 NOTE — Anesthesia Preprocedure Evaluation (Addendum)
Anesthesia Evaluation  Patient identified by MRN, date of birth, ID band Patient awake    Reviewed: Allergy & Precautions, H&P , NPO status , Patient's Chart, lab work & pertinent test results  Airway Mallampati: I  TM Distance: >3 FB Neck ROM: full    Dental  (+) Upper Dentures   Pulmonary Current Smoker (+vaping),    breath sounds clear to auscultation       Cardiovascular negative cardio ROS   Rhythm:regular Rate:Normal     Neuro/Psych  Headaches, PSYCHIATRIC DISORDERS Anxiety Depression    GI/Hepatic negative GI ROS, Neg liver ROS,   Endo/Other  negative endocrine ROS  Renal/GU      Musculoskeletal   Abdominal   Peds  Hematology negative hematology ROS (+)   Anesthesia Other Findings Past Medical History: No date: ADD (attention deficit disorder) No date: Anemia No date: Anxiety No date: Depression     Comment:  no meds, doing ok No date: Ectopic pregnancy No date: Ectopic pregnancy     Comment:  received methotrexate No date: Headache(784.0)     Comment:  had migraines when she was younger No date: Infection     Comment:  UTI No date: Kidney infection No date: 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 06/09/2016: Normal pregnancy in third trimester No date: Oppositional defiant disorder 01/29/2016: PAC (premature atrial contraction) 01/03/2016: Palpitations No date: Pregnant 01/29/2016: PVC (premature ventricular contraction) 06/10/2016: SVD (spontaneous vaginal delivery)  Past Surgical History: No date: ANKLE SURGERY; Left     Comment:  reconstruction  07/04/2013: MASS EXCISION; Left     Comment:  Procedure: EXCISION MASS;  Surgeon: Nadara Mustard, MD;                Location: [facility] OR;  Service: Orthopedics;  Laterality:               Left;  Excision Left Foot Talo-calcaneous fibrous bar No date: tubes in ears     Comment:  in the past No date: WISDOM TOOTH EXTRACTION  BMI    Body Mass Index:  20.90 kg/m       Reproductive/Obstetrics negative OB ROS                            Anesthesia Physical Anesthesia Plan  ASA: II  Anesthesia Plan: General ETT   Post-op Pain Management:    Induction:   PONV Risk Score and Plan: Ondansetron and Dexamethasone  Airway Management Planned:   Additional Equipment:   Intra-op Plan:   Post-operative Plan:   Informed Consent: I have reviewed the patients History and Physical, chart, labs and discussed the procedure including the risks, benefits and alternatives for the proposed anesthesia with the patient or authorized representative who has indicated his/her understanding and acceptance.   Dental Advisory Given  Plan Discussed with: Anesthesiologist, CRNA and Surgeon  Anesthesia Plan Comments:         Anesthesia Quick Evaluation

## 2018-07-25 NOTE — ED Provider Notes (Signed)
[facility] Emergency Department Provider Note  ____________________________________________  Time seen: Approximately 5:10 PM  I have reviewed the triage vital signs and the nursing notes.   HISTORY  Chief Complaint Abdominal Pain   HPI Allison Bernard is a 22 y.o. female who presents for evaluation of right lower quadrant abdominal pain.  Patient reports 3 days of constant dull pain, nausea and anorexia.  She reports that the pain is worse with ambulation.  No fever or chills, no dysuria or hematuria, no diarrhea or constipation.  Patient denies any prior abdominal surgeries.  Currently her pain is severe.  Past Medical History:  Diagnosis Date  . ADD (attention deficit disorder)   . Anemia   . Anxiety   . Depression    no meds, doing ok  . Ectopic pregnancy   . Ectopic pregnancy    received methotrexate  . Headache(784.0)    had migraines when she was younger  . Infection    UTI  . Kidney infection   . 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 Normal pregnancy in third trimester 06/09/2016  . Oppositional defiant disorder   . PAC (premature atrial contraction) 01/29/2016  . Palpitations 01/03/2016  . Pregnant   . PVC (premature ventricular contraction) 01/29/2016  . SVD (spontaneous vaginal delivery) 06/10/2016    Patient Active Problem List   Diagnosis Date Noted  . Normal pregnancy 06/10/2016  . SVD (spontaneous vaginal delivery) 06/10/2016  . Normal pregnancy in third trimester 06/09/2016  . PAC (premature atrial contraction) 01/29/2016  . PVC (premature ventricular contraction) 01/29/2016  . Palpitations 01/03/2016  . Panic disorder without agoraphobia 08/21/2014  . Major depressive disorder, recurrent episode, moderate (HCC) 08/21/2014  . GAD (generalized anxiety disorder) 01/01/2012  . 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 Depressive disorder, not elsewhere classified 11/02/2011  . Anxiety state, unspecified 11/02/2011     Past Surgical History:  Procedure Laterality Date  . ANKLE SURGERY Left    reconstruction   . MASS EXCISION Left 07/04/2013   Procedure: EXCISION MASS;  Surgeon: Nadara Mustard, MD;  Location: [facility] OR;  Service: Orthopedics;  Laterality: Left;  Excision Left Foot Talo-calcaneous fibrous bar  . tubes in ears     in the past  . WISDOM TOOTH EXTRACTION      Prior to Admission medications   Medication Sig Start Date End Date Taking? Authorizing Provider  acetaminophen (TYLENOL) 325 MG tablet Take by mouth every 6 (six) hours as needed for moderate pain or headache.    [provider]  albuterol (PROVENTIL HFA;VENTOLIN HFA) 108 (90 Base) MCG/ACT inhaler Inhale 2 puffs into the lungs every 4 (four) hours as needed for wheezing or shortness of breath. 06/29/18   Joy, Shawn C, PA-C  azithromycin (ZITHROMAX) 250 MG tablet Take first 2 tablets together on the first day, then starting on day two, take 1 every day until finished. 06/29/18   Joy, Shawn C, PA-C  busPIRone (BUSPAR) 15 MG tablet Take 1 tablet (15 mg total) by mouth 2 (two) times daily. 10/28/17   Oletta Darter, MD  Levonorgestrel 19.5 MG IUD by Intrauterine route. Was place 11/17    [provider]  PARoxetine (PAXIL) 40 MG tablet Take 1 tablet (40 mg total) by mouth daily. 10/28/17 10/28/18  Oletta Darter, MD  predniSONE (STERAPRED UNI-PAK 21 TAB) 10 MG (21) TBPK tablet Take 6 tabs (60mg ) day 1, 5 tabs (50mg ) day 2, 4 tabs (40mg ) day 3, 3 tabs (30mg ) day  4, 2 tabs (20mg ) day 5, and 1 tab (10mg ) day 6. 06/29/18   Joy, Shawn C, PA-C  Prenatal Vit-Fe Fumarate-FA (PRENATAL MULTIVITAMIN) TABS tablet Take 1 tablet by mouth at bedtime.     [provider]    Allergies Patient has no known allergies.  Family History  Problem Relation Age of Onset  . Depression Mother   . Anxiety disorder Mother   . Hypertension Father   . Diabetes Father   . Heart failure Maternal Grandmother   . Hypertension Maternal Grandmother    . Cancer Maternal Grandmother        4 types  . Diabetes Maternal Grandmother   . Hypertension Maternal Grandfather   . Mesothelioma Maternal Grandfather   . Cancer Paternal Grandmother        uterine    Social History Social History   Tobacco Use  . Smoking status: Current Every Day Smoker    Types: E-cigarettes  . Smokeless tobacco: Never Used  . Tobacco comment: quit prior to preg  Substance Use Topics  . Alcohol use: No    Comment: 1 drink a month  . Drug use: No    Review of Systems  Constitutional: Negative for fever. Eyes: Negative for visual changes. ENT: Negative for sore throat. Neck: No neck pain  Cardiovascular: Negative for chest pain. Respiratory: Negative for shortness of breath. Gastrointestinal: + RLQ abdominal pain and nausea. No vomiting or diarrhea. Genitourinary: Negative for dysuria. Musculoskeletal: Negative for back pain. Skin: Negative for rash. Neurological: Negative for headaches, weakness or numbness. Psych: No SI or HI  ____________________________________________   PHYSICAL EXAM:  VITAL SIGNS: ED Triage Vitals  Enc Vitals Group     BP 07/25/18 1538 108/74     Pulse Rate 07/25/18 1538 89     Resp 07/25/18 1538 14     Temp 07/25/18 1538 98.5 F (36.9 C)     Temp Source 07/25/18 1538 Oral     SpO2 07/25/18 1538 95 %     Weight 07/25/18 1539 118 lb (53.5 kg)     Height 07/25/18 1539 5\' 3"  (1.6 m)     Head Circumference --      Peak Flow --      Pain Score 07/25/18 1538 8     Pain Loc --      Pain Edu? --      Excl. in GC? --     Constitutional: Alert and oriented. Well appearing and in no apparent distress. HEENT:      Head: Normocephalic and atraumatic.         Eyes: Conjunctivae are normal. Sclera is non-icteric.       Mouth/Throat: Mucous membranes are moist.       Neck: Supple with no signs of meningismus. Cardiovascular: Regular rate and rhythm. No murmurs, gallops, or rubs. 2+ symmetrical distal pulses are present  in all extremities. No JVD. Respiratory: Normal respiratory effort. Lungs are clear to auscultation bilaterally. No wheezes, crackles, or rhonchi.  Gastrointestinal: Soft, tender to palpation on the right lower quadrant with localized guarding and positive Rovsing sign, and non distended with positive bowel sounds. No rebound or guarding. Musculoskeletal: Nontender with normal range of motion in all extremities. No edema, cyanosis, or erythema of extremities. Neurologic: Normal speech and language. Face is symmetric. Moving all extremities. No gross focal neurologic deficits are appreciated. Skin: Skin is warm, dry and intact. No rash noted. Psychiatric: Mood and affect are normal. Speech and behavior are normal.  ____________________________________________   LABS (all labs ordered are listed, but only abnormal results are displayed)  Labs Reviewed  COMPREHENSIVE METABOLIC PANEL - Abnormal; Notable for the following components:      Result Value   AST 12 (*)    Total Bilirubin 1.6 (*)    All other components within normal limits  CBC - Abnormal; Notable for the following components:   WBC 12.6 (*)    All other components within normal limits  URINALYSIS, COMPLETE (UACMP) WITH MICROSCOPIC - Abnormal; Notable for the following components:   Color, Urine YELLOW (*)    APPearance CLEAR (*)    Hgb urine dipstick SMALL (*)    Ketones, ur 5 (*)    Leukocytes, UA MODERATE (*)    Bacteria, UA RARE (*)    All other components within normal limits  LIPASE, BLOOD  POC URINE PREG, ED  POCT PREGNANCY, URINE   ____________________________________________  EKG  none  ____________________________________________  RADIOLOGY  I have personally reviewed the images performed during this visit and I agree with the Radiologist's read.   Interpretation by Radiologist:  Ct Abdomen Pelvis W Contrast  Result Date: 07/25/2018 CLINICAL DATA:  Acute lower abdominal pain. EXAM: CT ABDOMEN AND  PELVIS WITH CONTRAST TECHNIQUE: Multidetector CT imaging of the abdomen and pelvis was performed using the standard protocol following bolus administration of intravenous contrast. CONTRAST:  75mL OMNIPAQUE IOHEXOL 300 MG/ML  SOLN COMPARISON:  CT scan of October 02, 2012. FINDINGS: Lower chest: No acute abnormality. Hepatobiliary: No focal liver abnormality is seen. No gallstones, gallbladder wall thickening, or biliary dilatation. Pancreas: Unremarkable. No pancreatic ductal dilatation or surrounding inflammatory changes. Spleen: Normal in size without focal abnormality. Adrenals/Urinary Tract: Adrenal glands are unremarkable. Kidneys are normal, without renal calculi, focal lesion, or hydronephrosis. Bladder is unremarkable. Stomach/Bowel: Stomach appears normal. There is no evidence of bowel obstruction. The appendix is enlarged with surrounding inflammation consistent with acute appendicitis. Appendix: Location: Right lower quadrant. Diameter: 10 mm. Appendicolith: No. Mucosal hyper-enhancement: No. Extraluminal gas: No. Periappendiceal collection: No. Vascular/Lymphatic: No significant vascular findings are present. No enlarged abdominal or pelvic lymph nodes. Reproductive: Intrauterine device is noted. No adnexal abnormality is noted. Other: No abdominal wall hernia or abnormality. No abdominopelvic ascites. Musculoskeletal: No acute or significant osseous findings. IMPRESSION: Findings consistent with acute appendicitis.  No abscess is noted. Electronically Signed   By: Lupita Raider, M.D.   On: 07/25/2018 17:04     ____________________________________________   PROCEDURES  Procedure(s) performed: None Procedures Critical Care performed: yes  CRITICAL CARE Performed by: Nita Sickle  ?  Total critical care time: 30 min  Critical care time was exclusive of separately billable procedures and treating other patients.  Critical care was necessary to treat or prevent imminent or  life-threatening deterioration.  Critical care was time spent personally by me on the following activities: development of treatment plan with patient and/or surrogate as well as nursing, discussions with consultants, evaluation of patient's response to treatment, examination of patient, obtaining history from patient or surrogate, ordering and performing treatments and interventions, ordering and review of laboratory studies, ordering and review of radiographic studies, pulse oximetry and re-evaluation of patient's condition.  ____________________________________________   INITIAL IMPRESSION / ASSESSMENT AND PLAN / ED COURSE  22 y.o. female who presents for evaluation of right lower quadrant abdominal pain.  Normal vital signs, tender to palpation of the right lower quadrant with positive Rovsing sign and localized guarding concern for appendicitis.  CT abdomen pelvis confirms  acute uncomplicated appendicitis.  No signs of sepsis. Will consult surgery for evaluation. Will start patient on zosyn      As part of my medical decision making, I reviewed the following data within the electronic MEDICAL RECORD NUMBER Nursing notes reviewed and incorporated, Labs reviewed , Old chart reviewed, Radiograph reviewed , A consult was requested and obtained from this/these consultant(s) Surgery, Notes from prior ED visits and [place] Controlled Substance Database    Pertinent labs & imaging results that were available during my care of the patient were reviewed by me and considered in my medical decision making (see chart for details).    ____________________________________________   FINAL CLINICAL IMPRESSION(S) / ED DIAGNOSES  Final diagnoses:  Appendicitis, acute, with peritonitis      NEW MEDICATIONS STARTED DURING THIS VISIT:  ED Discharge Orders    None       Note:  This document was prepared using Dragon voice recognition software and may include unintentional dictation errors.      Nita Sickle, MD 07/25/18 [phone]

## 2018-07-26 ENCOUNTER — Encounter: Payer: Self-pay | Admitting: General Surgery

## 2018-07-26 MED ORDER — HYDROCODONE-ACETAMINOPHEN 5-325 MG PO TABS: 1.0000 | ORAL_TABLET | ORAL | 0 refills | Status: DC | PRN

## 2018-07-26 NOTE — Anesthesia Postprocedure Evaluation (Signed)
Anesthesia Post Note  Patient: Allison Bernard  Procedure(s) Performed: APPENDECTOMY LAPAROSCOPIC (N/A Abdomen)  Patient location during evaluation: PACU Anesthesia Type: General Level of consciousness: awake and alert Pain management: pain level controlled Vital Signs Assessment: post-procedure vital signs reviewed and stable Respiratory status: spontaneous breathing, nonlabored ventilation, respiratory function stable and patient connected to nasal cannula oxygen Cardiovascular status: blood pressure returned to baseline and stable Postop Assessment: no apparent nausea or vomiting Anesthetic complications: no     Last Vitals:  Vitals:   07/25/18 2200 07/25/18 2354  BP: 100/62 106/69  Pulse: 75 84  Resp: 10 18  Temp: (!) 36.3 C 36.7 C  SpO2: 100% 99%    Last Pain:  Vitals:   07/26/18 0115  TempSrc:   PainSc: Asleep                 Jovita Gamma

## 2018-07-26 NOTE — Progress Notes (Signed)
Patient called out stating she was having a panic attack. When I entered the room, patient was crying (husband at side) saying that she was having a panic attack and complaining of pain in her R shoulder. I asked if she had ever had a panic attack before and she said yes all the time. I encouraged her to stand up and walk around that the pain she was experiencing was most likely gas pain. I educated her on gas pain and ways to help decrease it. She verbalized understanding and her and her husband took a couple laps around the unit. After the laps she said it was much better. I asked her if she wanted to stay for a little while since I have discharge orders on her and she said no, she was ok to go home

## 2018-07-26 NOTE — Progress Notes (Signed)
Patient left DC paperwork in room

## 2018-07-26 NOTE — Discharge Summary (Signed)
  Patient ID: Allison Bernard MRN: [id number] DOB/AGE: 04-Oct-1995 22 y.o.  Admit date: 07/25/2018 Discharge date: 07/26/2018   Discharge Diagnoses:  Active Problems:   Acute appendicitis with localized peritonitis   Procedures: Laparoscopic appendectomy  Hospital Course: Patient underwent appendectomy and tolerated procedure well. This morning, pain controlled, tolerating diet and ambulating.   Physical Exam  Constitutional: She is well-developed, well-nourished, and in no distress.  Cardiovascular: Normal rate and regular rhythm.  Pulmonary/Chest: Effort normal.  Abdominal: Soft. She exhibits no distension. There is no tenderness.  Wounds dry and clean.    Consults: None  Disposition: Discharge disposition: 01-Home or Self Care       Discharge Instructions    Diet - low sodium heart healthy   Complete by:  As directed    Increase activity slowly   Complete by:  As directed      Allergies as of 07/26/2018   No Known Allergies     Medication List    TAKE these medications   acetaminophen 325 MG tablet Commonly known as:  TYLENOL Take 325-650 mg by mouth every 6 (six) hours as needed for moderate pain or headache.   albuterol 108 (90 Base) MCG/ACT inhaler Commonly known as:  PROVENTIL HFA;VENTOLIN HFA Inhale 2 puffs into the lungs every 4 (four) hours as needed for wheezing or shortness of breath.   azithromycin 250 MG tablet Commonly known as:  ZITHROMAX Take first 2 tablets together on the first day, then starting on day two, take 1 every day until finished.   busPIRone 15 MG tablet Commonly known as:  BUSPAR Take 1 tablet (15 mg total) by mouth 2 (two) times daily.   HYDROcodone-acetaminophen 5-325 MG tablet Commonly known as:  NORCO/VICODIN Take 1 tablet by mouth every 4 (four) hours as needed for moderate pain.   ibuprofen 200 MG tablet Commonly known as:  ADVIL,MOTRIN Take 400-800 mg by mouth every 6 (six) hours as needed for mild pain or  moderate pain.   Levonorgestrel 19.5 MG Iud by Intrauterine route. Was place 11/17   PARoxetine 40 MG tablet Commonly known as:  PAXIL Take 1 tablet (40 mg total) by mouth daily.   predniSONE 10 MG (21) Tbpk tablet Commonly known as:  STERAPRED UNI-PAK 21 TAB Take 6 tabs (60mg ) day 1, 5 tabs (50mg ) day 2, 4 tabs (40mg ) day 3, 3 tabs (30mg ) day 4, 2 tabs (20mg ) day 5, and 1 tab (10mg ) day 6.      Follow-up Information    Carolan Shiver, MD Follow up in 2 week(s).   Specialty:  General Surgery Contact information: [address] [place] [address] [phone]

## 2018-07-26 NOTE — Progress Notes (Signed)
Patient discharged home. Discharge instructions, prescriptions and follow up appointment given to and reviewed with patient. Patient verbalized understanding. Patient wheeled out by auxiliary.  

## 2018-07-26 NOTE — Discharge Instructions (Signed)

## 2018-07-27 LAB — HIV ANTIBODY (ROUTINE TESTING W REFLEX): HIV SCREEN 4TH GENERATION: NONREACTIVE

## 2018-07-27 LAB — SURGICAL PATHOLOGY

## 2018-08-04 ENCOUNTER — Encounter (HOSPITAL_COMMUNITY): Payer: Self-pay | Admitting: Psychiatry

## 2018-08-04 ENCOUNTER — Ambulatory Visit (INDEPENDENT_AMBULATORY_CARE_PROVIDER_SITE_OTHER): Payer: BLUE CROSS/BLUE SHIELD | Admitting: Psychiatry

## 2018-08-04 VITALS — BP 92/64 | HR 80 | Ht 63.0 in | Wt 118.0 lb

## 2018-08-04 DIAGNOSIS — F410 Panic disorder [episodic paroxysmal anxiety] without agoraphobia: Secondary | ICD-10-CM

## 2018-08-04 DIAGNOSIS — F330 Major depressive disorder, recurrent, mild: Secondary | ICD-10-CM | POA: Diagnosis not present

## 2018-08-04 DIAGNOSIS — F411 Generalized anxiety disorder: Secondary | ICD-10-CM

## 2018-08-04 MED ORDER — BUSPIRONE HCL 15 MG PO TABS: 15.0000 mg | ORAL_TABLET | Freq: Two times a day (BID) | ORAL | 1 refills | Status: DC

## 2018-08-04 MED ORDER — PAROXETINE HCL 40 MG PO TABS: 40.0000 mg | ORAL_TABLET | Freq: Every day | ORAL | 1 refills | Status: DC

## 2018-08-04 NOTE — Progress Notes (Signed)
BH MD/PA/NP OP Progress Note  08/04/2018 11:28 AM Allison Bernard  MRN:  [id number]  Chief Complaint:  Chief Complaint    Anxiety; Depression     HPI: I last saw the patient in Jan 2019.  Iyonna tells me she lost her insurance but had refills on her medication so she continued to take the Paxil and BuSpar.  She ran out of the medication 1 month ago.  Since then she has been experiencing some sadness but denies any depression.  She denies anhedonia, isolation, crying, hopelessness.  She denies SI/HI.  She has been having some stressors over the last month.  She had bronchitis and is now starting to recover.  1 week ago she had emergent appendectomy but is feeling better this week.  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  Trea states that her anxiety has dramatically decreased after running out of the medication.  She wakes up with anxious feeling in the pit of her stomach that continues all throughout the day.  She has racing thoughts and is always focused on negative outcomes.  The anxiety makes her on productive and she is having 2-3 panic attacks a week.  All of the symptoms that she reported were well managed prior to running out of medication.  Today she would like to restart the medication at their prescribed doses.   Visit Diagnosis:    ICD-10-CM   1. GAD (generalized anxiety disorder) F41.1 busPIRone (BUSPAR) 15 MG tablet    PARoxetine (PAXIL) 40 MG tablet  2. Mild episode of recurrent major depressive disorder (HCC) F33.0 busPIRone (BUSPAR) 15 MG tablet    PARoxetine (PAXIL) 40 MG tablet  3. 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 ADD (attention deficit disorder)   . Anemia   . Anxiety   . Depression    no meds, doing ok  . Ectopic pregnancy   . Ectopic pregnancy    received methotrexate  . Headache(784.0)    had migraines when she was younger  . Infection    UTI  . Kidney infection   . 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 Normal pregnancy in third trimester 06/09/2016  . Oppositional defiant disorder   . PAC (premature atrial contraction) 01/29/2016  . Palpitations 01/03/2016  . Pregnant   . PVC (premature ventricular contraction) 01/29/2016  . SVD (spontaneous vaginal delivery) 06/10/2016    Past Surgical History:  Procedure Laterality Date  . ANKLE SURGERY Left    reconstruction   . LAPAROSCOPIC APPENDECTOMY N/A 07/25/2018   Procedure: APPENDECTOMY LAPAROSCOPIC;  Surgeon: Carolan Shiver, MD;  Location: [facility] ORS;  Service: General;  Laterality: N/A;  . MASS EXCISION Left 07/04/2013   Procedure: EXCISION MASS;  Surgeon: Nadara Mustard, MD;  Location: [facility] OR;  Service: Orthopedics;  Laterality: Left;  Excision Left Foot Talo-calcaneous fibrous bar  . tubes in ears     in the past  . WISDOM TOOTH EXTRACTION      Family Psychiatric History:  Family History  Problem Relation Age of Onset  . Depression Mother   . Anxiety disorder Mother   .  Hypertension Father   . Diabetes Father   . Heart failure Maternal Grandmother   . Hypertension Maternal Grandmother   . Cancer Maternal Grandmother        4 types  . Diabetes Maternal Grandmother   . Hypertension Maternal Grandfather   . Mesothelioma Maternal Grandfather   . Cancer Paternal Grandmother        uterine    Social History:  Social History   Socioeconomic History  . Marital status: Single    Spouse name: Not on file  . Number of children: Not on file  . Years of education: Not on file  . Highest education level: Not on file  Occupational  History  . Not on file  Social Needs  . Financial resource strain: Not on file  . Food insecurity:    Worry: Not on file    Inability: Not on file  . Transportation needs:    Medical: Not on file    Non-medical: Not on file  Tobacco Use  . Smoking status: Current Every Day Smoker    Types: E-cigarettes  . Smokeless tobacco: Never Used  . Tobacco comment: quit prior to preg  Substance and Sexual Activity  . Alcohol use: No    Comment: 1 drink a month  . Drug use: No  . Sexual activity: Yes    Birth control/protection: IUD  Lifestyle  . Physical activity:    Days per week: Not on file    Minutes per session: Not on file  . Stress: Not on file  Relationships  . Social connections:    Talks on phone: Not on file    Gets together: Not on file    Attends religious service: Not on file    Active member of club or organization: Not on file    Attends meetings of clubs or organizations: Not on file    Relationship status: Not on file  Other Topics Concern  . Not on file  Social History Narrative  . Not on file    Allergies: No Known Allergies  Metabolic Disorder Labs: No results found for: HGBA1C, MPG No results found for: PROLACTIN No results found for: CHOL, TRIG, HDL, CHOLHDL, VLDL, LDLCALC No results found for: TSH  Therapeutic Level Labs: No results found for: LITHIUM No results found for: VALPROATE No components found for:  CBMZ  Current Medications: Current Outpatient Medications  Medication Sig Dispense Refill  . acetaminophen (TYLENOL) 325 MG tablet Take 325-650 mg by mouth every 6 (six) hours as needed for moderate pain or headache.     . busPIRone (BUSPAR) 15 MG tablet Take 1 tablet (15 mg total) by mouth 2 (two) times daily. 60 tablet 1  . ibuprofen (ADVIL,MOTRIN) 200 MG tablet Take 400-800 mg by mouth every 6 (six) hours as needed for mild pain or moderate pain.    . Levonorgestrel 19.5 MG IUD by Intrauterine route. Was place 11/17    . PARoxetine  (PAXIL) 40 MG tablet Take 1 tablet (40 mg total) by mouth daily. 30 tablet 1  . HYDROcodone-acetaminophen (NORCO/VICODIN) 5-325 MG tablet Take 1 tablet by mouth every 4 (four) hours as needed for moderate pain. (Patient not taking: Reported on 08/04/2018) 10 tablet 0   No current facility-administered medications for this visit.      Musculoskeletal: Strength & Muscle Tone: within normal limits Gait & Station: normal Patient leans: N/A  Psychiatric Specialty Exam: Review of Systems  Respiratory: Negative for cough, shortness of breath and wheezing.  Gastrointestinal: Negative for abdominal pain, constipation, diarrhea, nausea and vomiting.    Blood pressure 92/64, pulse 80, height 5\' 3"  (1.6 m), weight 118 lb (53.5 kg), currently breastfeeding.Body mass index is 20.9 kg/m.  General Appearance: Fairly Groomed  Eye Contact:  Good  Speech:  Clear and Coherent and Normal Rate  Volume:  Normal  Mood:  Anxious  Affect:  Congruent  Thought Process:  Goal Directed and Descriptions of Associations: Intact  Orientation:  Full (Time, Place, and Person)  Thought Content:  Logical  Suicidal Thoughts:  No  Homicidal Thoughts:  No  Memory:  Immediate;   Good  Judgement:  Good  Insight:  Good  Psychomotor Activity:  Normal  Concentration:  Concentration: Good  Recall:  Good  Fund of Knowledge:  Good  Language:  Good  Akathisia:  No  Handed:  Right  AIMS (if indicated):     Assets:  Communication Skills Desire for Improvement Financial Resources/Insurance Housing Intimacy Physical Health Resilience Social Support Talents/Skills Transportation Vocational/Educational  ADL's:  Intact  Cognition:  WNL  Sleep:   good      Screenings: PHQ2-9     Counselor from 06/06/2015 in BEHAVIORAL HEALTH OUTPATIENT THERAPY [place]  PHQ-2 Total Score  4  PHQ-9 Total Score  13      I reviewed the information below on 08/04/2018 and have updated it Assessment and Plan: MDD-recurrent,  mild; panic disorder without agoraphobia; GAD; ADHD-inattentive type    Medication management with supportive therapy. Risks/benefits and SE of the medication discussed. Pt verbalized understanding and verbal consent obtained for treatment.  Affirm with the patient that the medications are taken as ordered. Patient expressed understanding of how their medications were to be used.   Pt has an IUD so risk of pregnancy is lower.  Meds: restart Paxil 40 mg p.o. daily for MDD and panic disorder Restart Buspar 15 mg p.o. twice daily for panic disorder and mood augmentation  Labs: none  Therapy: brief supportive therapy provided. Discussed psychosocial stressors in detail.     Consultations: none  Pt denies SI and is at an acute low risk for suicide. Patient told to call clinic if any problems occur. Patient advised to go to ER if they should develop SI/HI, side effects, or if symptoms worsen. Has crisis numbers to call if needed. Pt verbalized understanding.  F/up in 2 months or sooner if needed    Oletta Darter, MD 08/04/2018, 11:28 AM

## 2018-10-20 ENCOUNTER — Ambulatory Visit (INDEPENDENT_AMBULATORY_CARE_PROVIDER_SITE_OTHER): Payer: BLUE CROSS/BLUE SHIELD | Admitting: Psychiatry

## 2018-10-20 ENCOUNTER — Encounter (HOSPITAL_COMMUNITY): Payer: Self-pay | Admitting: Psychiatry

## 2018-10-20 DIAGNOSIS — F410 Panic disorder [episodic paroxysmal anxiety] without agoraphobia: Secondary | ICD-10-CM

## 2018-10-20 DIAGNOSIS — F411 Generalized anxiety disorder: Secondary | ICD-10-CM

## 2018-10-20 DIAGNOSIS — F330 Major depressive disorder, recurrent, mild: Secondary | ICD-10-CM

## 2018-10-20 MED ORDER — PAROXETINE HCL 40 MG PO TABS: 40.0000 mg | ORAL_TABLET | Freq: Every day | ORAL | 3 refills | Status: DC

## 2018-10-20 MED ORDER — BUSPIRONE HCL 15 MG PO TABS: 15.0000 mg | ORAL_TABLET | Freq: Two times a day (BID) | ORAL | 3 refills | Status: DC

## 2018-10-20 NOTE — Progress Notes (Signed)
BH MD/PA/NP OP Progress Note  10/20/2018 9:30 AM Allison Bernard  MRN:  [id number]  Chief Complaint:  Chief Complaint    Anxiety; Follow-up     HPI: Allison ShamesLauren tells me that now that she has restarted her Paxil and BuSpar she is feeling much better.  Her depression is mild and manageable.  She is social and engages in numerous activities.  She is sleeping better than she has ever slept in the past.  She is denying any SI/HI.  She states "actually this is the best I have ever felt since puberty".  Her anxiety has significantly improved.  She states that it is manageable.  She has maybe 1-2 panic attacks a month and they are mostly related to being overstimulated by sensory overload.  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  Overall she feels she is doing well.  She is tolerating both medications without side effect and feels it is a good combination for her.  She is asking to start therapy to help her deal with some of the emotional stress related to her work.    Visit Diagnosis:    ICD-10-CM   1. Mild episode of recurrent major depressive disorder (HCC) F33.0 busPIRone (BUSPAR) 15 MG tablet    PARoxetine (PAXIL) 40 MG tablet  2. GAD (generalized anxiety disorder) F41.1 busPIRone (BUSPAR) 15 MG tablet    PARoxetine (PAXIL) 40 MG tablet  3. 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 ADD (attention deficit disorder)   . Anemia   . Anxiety   . Depression    no meds, doing ok  . Ectopic pregnancy   .  Ectopic pregnancy    received methotrexate  . Headache(784.0)    had migraines when she was younger  . Infection    UTI  . Kidney infection   . 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 Normal pregnancy in third trimester 06/09/2016  . Oppositional defiant disorder   . PAC (premature atrial contraction) 01/29/2016  . Palpitations 01/03/2016  . Pregnant   . PVC (premature ventricular contraction) 01/29/2016  . SVD (spontaneous vaginal delivery) 06/10/2016    Past Surgical History:  Procedure Laterality Date  . ANKLE SURGERY Left    reconstruction   . LAPAROSCOPIC APPENDECTOMY N/A 07/25/2018   Procedure: APPENDECTOMY LAPAROSCOPIC;  Surgeon: Carolan Shiverintron-Diaz, Edgardo, MD;  Location: [facility] ORS;  Service: General;  Laterality: N/A;  . MASS EXCISION Left 07/04/2013   Procedure: EXCISION MASS;  Surgeon: Nadara MustardMarcus V Duda, MD;  [facility];  Service: Orthopedics;  Laterality: Left;  Excision Left Foot Talo-calcaneous fibrous bar  . tubes in ears     in the past  . WISDOM TOOTH EXTRACTION      Family Psychiatric History:  Family History  Problem Relation Age of Onset  . Depression Mother   . Anxiety disorder Mother   . Hypertension Father   . Diabetes Father   . Heart failure Maternal Grandmother   . Hypertension Maternal Grandmother   . Cancer Maternal Grandmother        4 types  .  Diabetes Maternal Grandmother   . Hypertension Maternal Grandfather   . Mesothelioma Maternal Grandfather   . Cancer Paternal Grandmother        uterine    Social History:  Social History   Socioeconomic History  . Marital status: Single    Spouse name: Not on file  . Number of children: Not on file  . Years of education: Not on file  . Highest education level: Not on file  Occupational History  . Not on file  Social Needs  . Financial resource strain: Not on file  . Food insecurity:    Worry: Not on file    Inability: Not on file  . Transportation needs:    Medical: Not on file    Non-medical: Not on file  Tobacco Use   . Smoking status: Current Every Day Smoker    Types: E-cigarettes  . Smokeless tobacco: Never Used  . Tobacco comment: quit prior to preg  Substance and Sexual Activity  . Alcohol use: No    Comment: 1 drink a month  . Drug use: No  . Sexual activity: Yes    Birth control/protection: I.U.D.  Lifestyle  . Physical activity:    Days per week: Not on file    Minutes per session: Not on file  . Stress: Not on file  Relationships  . Social connections:    Talks on phone: Not on file    Gets together: Not on file    Attends religious service: Not on file    Active member of club or organization: Not on file    Attends meetings of clubs or organizations: Not on file    Relationship status: Not on file  Other Topics Concern  . Not on file  Social History Narrative  . Not on file    Allergies: No Known Allergies  Metabolic Disorder Labs: No results found for: HGBA1C, MPG No results found for: PROLACTIN No results found for: CHOL, TRIG, HDL, CHOLHDL, VLDL, LDLCALC No results found for: TSH  Therapeutic Level Labs: No results found for: LITHIUM No results found for: VALPROATE No components found for:  CBMZ  Current Medications: Current Outpatient Medications  Medication Sig Dispense Refill  . acetaminophen (TYLENOL) 325 MG tablet Take 325-650 mg by mouth every 6 (six) hours as needed for moderate pain or headache.     . busPIRone (BUSPAR) 15 MG tablet Take 1 tablet (15 mg total) by mouth 2 (two) times daily. 60 tablet 3  . ibuprofen (ADVIL,MOTRIN) 200 MG tablet Take 400-800 mg by mouth every 6 (six) hours as needed for mild pain or moderate pain.    . Levonorgestrel 19.5 MG IUD by Intrauterine route. Was place 11/17    . PARoxetine (PAXIL) 40 MG tablet Take 1 tablet (40 mg total) by mouth daily. 30 tablet 3  . HYDROcodone-acetaminophen (NORCO/VICODIN) 5-325 MG tablet Take 1 tablet by mouth every 4 (four) hours as needed for moderate pain. (Patient not taking: Reported on  08/04/2018) 10 tablet 0   No current facility-administered medications for this visit.      Musculoskeletal: Strength & Muscle Tone: within normal limits Gait & Station: normal Patient leans: N/A  Psychiatric Specialty Exam: Review of Systems  HENT: Negative for congestion, ear pain, sinus pain and sore throat.   Respiratory: Negative for cough, sputum production and shortness of breath.     Blood pressure 106/67, pulse 82, height 5\' 3"  (1.6 m), weight 119 lb (54 kg), SpO2 99 %, currently breastfeeding.Body  mass index is 21.08 kg/m.  General Appearance: Casual and Fairly Groomed  Eye Contact:  Good  Speech:  Clear and Coherent and Normal Rate  Volume:  Normal  Mood:  Euthymic  Affect:  Full Range  Thought Process:  Goal Directed and Descriptions of Associations: Intact  Orientation:  Full (Time, Place, and Person)  Thought Content:  Logical  Suicidal Thoughts:  No  Homicidal Thoughts:  No  Memory:  Immediate;   Good  Judgement:  Good  Insight:  Good  Psychomotor Activity:  Normal  Concentration:  Concentration: Good  Recall:  Good  Fund of Knowledge:  Good  Language:  Good  Akathisia:  No  Handed:  Right  AIMS (if indicated):     Assets:  Communication Skills Desire for Improvement Financial Resources/Insurance Housing Intimacy Leisure Time Physical Health Resilience Social Support Talents/Skills Transportation Vocational/Educational  ADL's:  Intact  Cognition:  WNL  Sleep:   good        Screenings: PHQ2-9     Counselor from 06/06/2015 in BEHAVIORAL HEALTH OUTPATIENT THERAPY [place]  PHQ-2 Total Score  4  PHQ-9 Total Score  13      I reviewed the information below on 10/20/2018 and have updated it Assessment and Plan: MDD-recurrent, mild; panic disorder without agoraphobia; GAD; ADHD-inattentive type    Medication management with supportive therapy. Risks/benefits and SE of the medication discussed. Pt verbalized understanding and verbal consent  obtained for treatment.  Affirm with the patient that the medications are taken as ordered. Patient expressed understanding of how their medications were to be used.   Pt has an IUD so risk of pregnancy is lower. Pt understands that these meds have teratogenic risks if she does become pregnant.   Meds: Paxil 40 mg p.o. daily for MDD and panic disorder Buspar 15 mg p.o. twice daily for panic disorder and mood augmentation  Labs: none  Therapy: brief supportive therapy provided. Discussed psychosocial stressors in detail.     Consultations: referred for therapy  Pt denies SI and is at an acute low risk for suicide. Patient told to call clinic if any problems occur. Patient advised to go to ER if they should develop SI/HI, side effects, or if symptoms worsen. Has crisis numbers to call if needed. Pt verbalized understanding.  F/up in 3 months or sooner if needed    Oletta Darter, MD 10/20/2018, 9:30 AM

## 2018-11-10 ENCOUNTER — Ambulatory Visit (INDEPENDENT_AMBULATORY_CARE_PROVIDER_SITE_OTHER): Payer: BLUE CROSS/BLUE SHIELD | Admitting: Licensed Clinical Social Worker

## 2018-11-10 DIAGNOSIS — F411 Generalized anxiety disorder: Secondary | ICD-10-CM | POA: Diagnosis not present
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
## 2018-11-30 ENCOUNTER — Ambulatory Visit (INDEPENDENT_AMBULATORY_CARE_PROVIDER_SITE_OTHER): Payer: BLUE CROSS/BLUE SHIELD | Admitting: Licensed Clinical Social Worker

## 2018-11-30 DIAGNOSIS — F411 Generalized anxiety disorder: Secondary | ICD-10-CM

## 2018-11-30 NOTE — Progress Notes (Signed)
   THERAPIST PROGRESS NOTE  Session Time: 2pm-2:50pm  Participation Level: Active  Behavioral Response: CasualAlertAnxious  Type of Therapy: Individual Therapy  Treatment Goals addressed: Anxiety and Coping  Interventions: CBT and Supportive  Summary: Allison Bernard is a 23 y.o. female who presents face to face with symptoms of anxiety including ruminating/racing thoughts. Client identified changes in protestors as adding to the recent stress of work. Client also shared an incident outside of work for which she felt responsible and guilty, however was able to work through identifying which person was responsible for different parts of the incident, not herself.  Suicidal/Homicidal: Nowithout intent/plan  Therapist Response: Clinician met with client, assessing for SI/HI/psychosis and overall level of functioning. Clinician inquired about recent stressors including school, work, and family. Clinician inquired about any coping skills attempted which were previously learned. Clinician reviewed with client CBT triangle and connection of thoughts, feelings, and behaviors. Clinician developed discrepancies with client related to acceptable and in control behaviors for herself vs other people. Clinician and client processed how this expectation effected her mood and feelings of worth. Clinician challenged client to find moments of gratitude to focus on a positive, mindful moment. Clinician validated client wanting to help others and reminded client of the importance of self care and maintaining a healthy mindset in order for her to successfully support others.  Plan: Return again in 2-3 weeks.  Diagnosis: Axis I: Generalized Anxiety Disorder      Olegario Messier, LCSW 11/30/2018

## 2018-12-14 ENCOUNTER — Ambulatory Visit (INDEPENDENT_AMBULATORY_CARE_PROVIDER_SITE_OTHER): Payer: BLUE CROSS/BLUE SHIELD | Admitting: Licensed Clinical Social Worker

## 2018-12-14 ENCOUNTER — Other Ambulatory Visit: Payer: Self-pay

## 2018-12-14 DIAGNOSIS — F411 Generalized anxiety disorder: Secondary | ICD-10-CM
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
## 2018-12-29 ENCOUNTER — Ambulatory Visit (INDEPENDENT_AMBULATORY_CARE_PROVIDER_SITE_OTHER): Payer: BLUE CROSS/BLUE SHIELD | Admitting: Licensed Clinical Social Worker

## 2018-12-29 ENCOUNTER — Other Ambulatory Visit: Payer: Self-pay

## 2018-12-29 DIAGNOSIS — F411 Generalized anxiety disorder: Secondary | ICD-10-CM | POA: Diagnosis not present

## 2019-01-02 NOTE — Progress Notes (Signed)
Virtual Visit via Video Note  I connected with Allison Bernard on 12/29/2018 at  3:00 PM EDT by a video enabled telemedicine application and verified that I am speaking with the correct person using two identifiers.   I discussed the limitations of evaluation and management by telemedicine and the availability of in person appointments. The patient expressed understanding and agreed to proceed.   I discussed the assessment and treatment plan with the patient. The patient was provided an opportunity to ask questions and all were answered. The patient agreed with the plan and demonstrated an understanding of the instructions.   The patient was advised to call back or seek an in-person evaluation if the symptoms worsen or if the condition fails to improve as anticipated.   THERAPIST PROGRESS NOTE  Session Time: 3pm-3:45pm  Participation Level: Active  Behavioral Response: CasualAlertEuthymic  Type of Therapy: Individual Therapy  Treatment Goals addressed: Coping  Interventions: CBT and Supportive  Summary: Allison Bernard is a 23 y.o. female who presents via telehealth with improving symptoms of anxiety and depression. Client verbalized feelings of responsibility related to protecting clients at work and the feeling of need to provide support. Client is able to identify cognitive distortions related to placing blame, responsibility, and accountability.   Suicidal/Homicidal: Nowithout intent/plan  Therapist Response: Clinician met with client, assessing for SI/HI/psycohsis and overall level of functioning. Clinician inquired about recent changes in stressors, skills used to address, and level of effectiveness of skills. Clinician and client processed core values and related cognitive distortions related to responsibility and accountability.   Plan: Return again in 2-3 weeks.  Diagnosis: Axis I: Generalized Anxiety Disorder and Major Depressive Disorder, mild     Olegario Messier,  LCSW 01/02/2019

## 2019-01-12 ENCOUNTER — Ambulatory Visit (INDEPENDENT_AMBULATORY_CARE_PROVIDER_SITE_OTHER): Payer: BLUE CROSS/BLUE SHIELD | Admitting: Licensed Clinical Social Worker

## 2019-01-12 ENCOUNTER — Other Ambulatory Visit: Payer: Self-pay

## 2019-01-12 DIAGNOSIS — F411 Generalized anxiety disorder: Secondary | ICD-10-CM

## 2019-01-12 DIAGNOSIS — F330 Major depressive disorder, recurrent, mild: Secondary | ICD-10-CM | POA: Diagnosis not present
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
## 2019-01-19 ENCOUNTER — Ambulatory Visit (HOSPITAL_COMMUNITY): Payer: BLUE CROSS/BLUE SHIELD | Admitting: Psychiatry

## 2019-01-19 ENCOUNTER — Other Ambulatory Visit: Payer: Self-pay

## 2019-01-19 ENCOUNTER — Encounter (HOSPITAL_COMMUNITY): Payer: Self-pay | Admitting: Psychiatry

## 2019-01-19 DIAGNOSIS — F330 Major depressive disorder, recurrent, mild: Secondary | ICD-10-CM

## 2019-01-19 DIAGNOSIS — F411 Generalized anxiety disorder: Secondary | ICD-10-CM

## 2019-01-19 DIAGNOSIS — F410 Panic disorder [episodic paroxysmal anxiety] without agoraphobia: Secondary | ICD-10-CM

## 2019-01-19 MED ORDER — PAROXETINE HCL 40 MG PO TABS: 40.0000 mg | ORAL_TABLET | Freq: Every day | ORAL | 3 refills | Status: DC

## 2019-01-19 MED ORDER — BUSPIRONE HCL 15 MG PO TABS: 15.0000 mg | ORAL_TABLET | Freq: Two times a day (BID) | ORAL | 3 refills | Status: DC

## 2019-01-19 NOTE — Progress Notes (Unsigned)
BH MD/PA/NP OP Progress Note Virtual Visit via Telephone Note  I connected with Bobbe MedicoLauren Smither on 01/19/19 at 8:30 AM EDT by telephone and verified that I am speaking with the correct person using two identifiers.    I discussed the limitations, risks, security and privacy concerns of performing an evaluation and management service by telephone and the availability of in person appointments. I also discussed with the patient that there may be a patient responsible charge related to this service. The patient expressed understanding and agreed to proceed.   01/19/2019 8:49 AM Christain SacramentoLauren A Kahl  MRN:  [id number]  Chief Complaint:  Chief Complaint    Anxiety; Follow-up     HPI:    ****Allison ShamesLauren tells me that now that she has restarted her Paxil and BuSpar she is feeling much better.  Her depression is mild and manageable.  She is social and engages in numerous activities.  She is sleeping better than she has ever slept in the past.  She is denying any SI/HI.  She states "actually this is the best I have ever felt since puberty".  Her anxiety has significantly improved.  She states that it is manageable.  She has maybe 1-2 panic attacks a month and they are mostly related to being overstimulated by sensory overload.  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  Overall she feels she is doing well.  She is tolerating both medications without side effect and feels it is a good combination for her.  She is asking to start therapy to help her deal with some of the emotional stress related to her work.    Visit Diagnosis:    ICD-10-CM   1. GAD (generalized anxiety disorder) F41.1 busPIRone (BUSPAR) 15 MG tablet    PARoxetine (PAXIL) 40 MG tablet  2. Mild episode of recurrent major depressive disorder (HCC) F33.0 busPIRone (BUSPAR) 15 MG tablet    PARoxetine (PAXIL) 40 MG tablet  3. 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 ADD (attention deficit disorder)   . Anemia   . Anxiety   . Depression    no meds, doing ok  . Ectopic pregnancy   . Ectopic pregnancy    received methotrexate  . Headache(784.0)    had migraines when she was younger  . Infection    UTI  . Kidney infection   . 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 Normal pregnancy in third trimester 06/09/2016  . Oppositional defiant disorder   . PAC (premature atrial contraction) 01/29/2016  . Palpitations 01/03/2016  . Pregnant   . PVC (premature ventricular contraction) 01/29/2016  . SVD (spontaneous vaginal delivery) 06/10/2016    Past Surgical History:  Procedure Laterality Date  . ANKLE SURGERY Left    reconstruction   . LAPAROSCOPIC APPENDECTOMY N/A 07/25/2018   Procedure: APPENDECTOMY LAPAROSCOPIC;  Surgeon: Carolan Shiverintron-Diaz, Edgardo, MD;  Location: [facility] ORS;  Service: General;  Laterality: N/A;  . MASS EXCISION Left 07/04/2013   Procedure: EXCISION MASS;  Surgeon: Nadara MustardMarcus V Duda, MD;  [facility];  Service: Orthopedics;  Laterality: Left;  Excision Left Foot Talo-calcaneous fibrous bar  . tubes in ears     in the past  . WISDOM TOOTH EXTRACTION      Family Psychiatric History:  Family History  Problem Relation Age of Onset  . Depression Mother   . Anxiety disorder Mother   . Hypertension Father   . Diabetes Father   . Heart failure Maternal Grandmother   . Hypertension Maternal Grandmother   . Cancer Maternal Grandmother        4 types  . Diabetes Maternal Grandmother   . Hypertension Maternal Grandfather   . Mesothelioma Maternal  Grandfather   . Cancer Paternal Grandmother        uterine    Social History:  Social History   Socioeconomic History  . Marital status: Single    Spouse name: Not on file  . Number of children: Not on file  . Years of education: Not on file  . Highest education level: Not on file  Occupational History  . Not on file  Social Needs  . Financial resource strain: Not on file  . Food insecurity:    Worry: Not on file    Inability: Not on file  . Transportation needs:    Medical: Not on file    Non-medical: Not on file  Tobacco Use  . Smoking status: Current Every Day Smoker    Types: E-cigarettes  . Smokeless tobacco: Never Used  . Tobacco comment: quit prior to preg  Substance and Sexual Activity  . Alcohol use: No    Comment: 1 drink a month  . Drug use: No  . Sexual activity: Yes    Birth control/protection: I.U.D.  Lifestyle  . Physical activity:    Days per week: Not on file    Minutes per session: Not on file  . Stress: Not on file  Relationships  . Social connections:    Talks on phone: Not on file    Gets together: Not on file    Attends religious service: Not on file    Active member of club or organization: Not on file    Attends meetings of clubs or organizations: Not on file    Relationship status: Not on file  Other Topics Concern  . Not on file  Social History Narrative  . Not on file    Allergies: No Known Allergies  Metabolic Disorder Labs: No results found for: HGBA1C, MPG No results found for: PROLACTIN No results found for: CHOL, TRIG, HDL, CHOLHDL, VLDL, LDLCALC No results found for: TSH  Therapeutic Level Labs: No results found for: LITHIUM No results found for: VALPROATE No components found for:  CBMZ  Current Medications: Current Outpatient Medications  Medication Sig Dispense Refill  . acetaminophen (TYLENOL) 325 MG tablet Take 325-650 mg by mouth every 6 (six) hours as needed for moderate pain or headache.     . busPIRone  (BUSPAR) 15 MG tablet Take 1 tablet (15 mg total) by mouth 2 (two) times daily. 60 tablet 3  . HYDROcodone-acetaminophen (NORCO/VICODIN) 5-325 MG tablet Take 1 tablet by mouth every 4 (four) hours as needed for moderate pain. 10 tablet 0  . ibuprofen (ADVIL,MOTRIN) 200 MG tablet Take 400-800 mg by mouth every 6 (six) hours as needed for mild pain or moderate pain.    . Levonorgestrel 19.5 MG IUD by Intrauterine route. Was place 11/17    . PARoxetine (PAXIL) 40 MG tablet Take 1 tablet (40 mg total) by mouth daily. 30 tablet 3  No current facility-administered medications for this visit.     Mental status exam:***        Screenings: PHQ2-9     Counselor from 06/06/2015 in BEHAVIORAL HEALTH OUTPATIENT THERAPY [place]  PHQ-2 Total Score  4  PHQ-9 Total Score  13       I reviewed the information below on 01/19/2019 and have updated it Assessment and Plan: MDD-recurrent, mild; panic disorder without agoraphobia; GAD; ADHD-inattentive type    Medication management with supportive therapy. Risks/benefits and SE of the medication discussed. Pt verbalized understanding and verbal consent obtained for treatment.  Affirm with the patient that the medications are taken as ordered. Patient expressed understanding of how their medications were to be used.   Pt has an IUD so risk of pregnancy is lower. Pt understands that these meds have teratogenic risks if she does become pregnant.   Meds: Paxil 40 mg p.o. daily for MDD and panic disorder Buspar 15 mg p.o. twice daily for panic disorder and mood augmentation Patient feels that her depression and anxiety are situational and does not want her medications changed at this time   Labs: none  Therapy: brief supportive therapy provided. Discussed psychosocial stressors in detail.     Consultations: encouraged to continue therapy- now doing every 2 weeks  Pt denies SI and is at an acute low risk for suicide. Patient told to call clinic if  any problems occur. Patient advised to go to ER if they should develop SI/HI, side effects, or if symptoms worsen. Has crisis numbers to call if needed. Pt verbalized understanding.  F/up in 3 months or sooner if needed-on April 13, 2019 at 4 PM  I provided 15 minutes of non-face-to-face time during this encounter  Oletta Darter, MD 01/19/2019, 8:49 AM

## 2019-01-26 ENCOUNTER — Ambulatory Visit (HOSPITAL_COMMUNITY): Payer: BLUE CROSS/BLUE SHIELD | Admitting: Licensed Clinical Social Worker

## 2019-01-26 ENCOUNTER — Other Ambulatory Visit: Payer: Self-pay

## 2019-01-27 ENCOUNTER — Other Ambulatory Visit: Payer: Self-pay

## 2019-01-27 ENCOUNTER — Ambulatory Visit (INDEPENDENT_AMBULATORY_CARE_PROVIDER_SITE_OTHER): Payer: BLUE CROSS/BLUE SHIELD | Admitting: Licensed Clinical Social Worker

## 2019-01-27 DIAGNOSIS — F411 Generalized anxiety disorder: Secondary | ICD-10-CM | POA: Diagnosis not present

## 2019-01-30 ENCOUNTER — Ambulatory Visit (HOSPITAL_COMMUNITY): Payer: BLUE CROSS/BLUE SHIELD | Admitting: Licensed Clinical Social Worker

## 2019-02-06 NOTE — Progress Notes (Signed)
Virtual Visit via Video Note  I connected with Allison Bernard on 01/27/2019 at 12:00 PM EDT by a video enabled telemedicine application and verified that I am speaking with the correct person using two identifiers.   I discussed the limitations of evaluation and management by telemedicine and the availability of in person appointments. The patient expressed understanding and agreed to proceed.   I discussed the assessment and treatment plan with the patient. The patient was provided an opportunity to ask questions and all were answered. The patient agreed with the plan and demonstrated an understanding of the instructions.   The patient was advised to call back or seek an in-person evaluation if the symptoms worsen or if the condition fails to improve as anticipated.  I provided 45 minutes of non-face-to-face time during this encounter.   Olegario Messier, LCSW    THERAPIST PROGRESS NOTE  Session Time: 12:05-12:50  Participation Level: Active  Behavioral Response: CasualAlertAnxious  Type of Therapy: Individual Therapy  Treatment Goals addressed: Coping  Interventions: CBT, Motivational Interviewing and Supportive  Summary: Allison Bernard is a 23 y.o. female who presents with Generalized Anxiety Disorder. Client verbalizes some disappointment in not being able to support her friend how she would like to, and the people who are trying to support her friend, are not being effective. Client shared concerns about taking summer classes and possibility of changes in finances. Client was able to identify several points in unhealthy relationships which strengthened negative thoughts about self. Client shared how she changed the relationship with her parents, which carries over in dynamics with her mother now. Client notes her current relationship with her husband is healthy and accepting that took a shift in her perspective about healthy vs unhealthy relationships.   Suicidal/Homicidal:  Nowithout intent/plan  Therapist Response: Clinician met with client via telehealth/video. Clinician and client processed distorted thoughts of self and 'stuck points' which re-enforced thoughts. Clinician worked with client to identify facts which counteracted negative self talk. Clinician and client checked out with focus on positive moment within the past two weeks.  Plan: Return again in 2 weeks.  Diagnosis: Axis I: Generalized Anxiety Disorder    Olegario Messier, LCSW 01/27/2019

## 2019-03-18 ENCOUNTER — Telehealth: Payer: BLUE CROSS/BLUE SHIELD

## 2019-03-18 ENCOUNTER — Emergency Department: Payer: BC Managed Care – PPO

## 2019-03-18 ENCOUNTER — Other Ambulatory Visit: Payer: Self-pay

## 2019-03-18 ENCOUNTER — Emergency Department
Admission: EM | Admit: 2019-03-18 | Discharge: 2019-03-18 | Disposition: A | Discharge status: Home / Self Care | Payer: BC Managed Care – PPO | Attending: Emergency Medicine | Admitting: Emergency Medicine

## 2019-03-18 DIAGNOSIS — F17290 Nicotine dependence, other tobacco product, uncomplicated: Secondary | ICD-10-CM | POA: Diagnosis not present

## 2019-03-18 DIAGNOSIS — Z20828 Contact with and (suspected) exposure to other viral communicable diseases: Secondary | ICD-10-CM | POA: Diagnosis not present

## 2019-03-18 DIAGNOSIS — Z79899 Other long term (current) drug therapy: Secondary | ICD-10-CM | POA: Insufficient documentation

## 2019-03-18 DIAGNOSIS — R0600 Dyspnea, unspecified: Secondary | ICD-10-CM | POA: Insufficient documentation

## 2019-03-18 DIAGNOSIS — R0602 Shortness of breath: Secondary | ICD-10-CM | POA: Diagnosis present

## 2019-03-18 DIAGNOSIS — J45901 Unspecified asthma with (acute) exacerbation: Secondary | ICD-10-CM

## 2019-03-18 MED ORDER — PREDNISONE 20 MG PO TABS
60.0000 mg | ORAL_TABLET | Freq: Once | ORAL | Status: AC
  Administered 2019-03-18: 60 mg via ORAL
  Filled 2019-03-18: qty 3

## 2019-03-18 MED ORDER — IPRATROPIUM-ALBUTEROL 0.5-2.5 (3) MG/3ML IN SOLN
3.0000 mL | Freq: Once | RESPIRATORY_TRACT | Status: AC
  Administered 2019-03-18: 3 mL via RESPIRATORY_TRACT
  Filled 2019-03-18: qty 3

## 2019-03-18 MED ORDER — PREDNISONE 20 MG PO TABS: 40.0000 mg | ORAL_TABLET | Freq: Every day | ORAL | 0 refills | Status: DC

## 2019-03-18 MED ORDER — ALBUTEROL SULFATE HFA 108 (90 BASE) MCG/ACT IN AERS: 2.0000 | INHALATION_SPRAY | Freq: Four times a day (QID) | RESPIRATORY_TRACT | 2 refills | Status: DC | PRN

## 2019-03-18 NOTE — ED Notes (Signed)
Pt verbalized understanding of discharge instructions. NAD at this time. 

## 2019-03-18 NOTE — ED Provider Notes (Signed)
[facility] Emergency Department Provider Note  Time seen: 10:05 AM  I have reviewed the triage vital signs and the nursing notes.   HISTORY  Chief Complaint Shortness of Breath   HPI Allison Bernard is a 23 y.o. female with a past medical history of anemia, anxiety, presents to the emergency department for shortness of breath.  According to the patient for the past several days she has been feeling short of breath, states she frequently will get bronchitis, denies any underlying asthma.  Denies any fever.  States she was feeling more short of breath overnight so she came to the emergency department for evaluation.  States currently she feels better.  Past Medical History:  Diagnosis Date  . ADD (attention deficit disorder)   . Anemia   . Anxiety   . Depression    no meds, doing ok  . Ectopic pregnancy   . Ectopic pregnancy    received methotrexate  . Headache(784.0)    had migraines when she was younger  . Infection    UTI  . Kidney infection   . 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 Normal pregnancy in third trimester 06/09/2016  . Oppositional defiant disorder   . PAC (premature atrial contraction) 01/29/2016  . Palpitations 01/03/2016  . Pregnant   . PVC (premature ventricular contraction) 01/29/2016  . SVD (spontaneous vaginal delivery) 06/10/2016    Patient Active Problem List   Diagnosis Date Noted  . Acute appendicitis with localized peritonitis 07/25/2018  . Normal pregnancy 06/10/2016  . SVD (spontaneous vaginal delivery) 06/10/2016  . Normal pregnancy in third trimester 06/09/2016  . PAC (premature atrial contraction) 01/29/2016  . PVC (premature ventricular contraction) 01/29/2016  . Palpitations 01/03/2016  . Panic disorder without agoraphobia 08/21/2014  . Major depressive disorder, recurrent episode, moderate (HCC) 08/21/2014  . GAD (generalized anxiety disorder) 01/01/2012  . 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  Depressive disorder, not elsewhere classified 11/02/2011  . Anxiety state, unspecified 11/02/2011    Past Surgical History:  Procedure Laterality Date  . ANKLE SURGERY Left    reconstruction   . LAPAROSCOPIC APPENDECTOMY N/A 07/25/2018   Procedure: APPENDECTOMY LAPAROSCOPIC;  Surgeon: Carolan Shiverintron-Diaz, Edgardo, MD;  Location: [facility] ORS;  Service: General;  Laterality: N/A;  . MASS EXCISION Left 07/04/2013   Procedure: EXCISION MASS;  Surgeon: Nadara MustardMarcus V Duda, MD;  [facility] OR;  Service: Orthopedics;  Laterality: Left;  Excision Left Foot Talo-calcaneous fibrous bar  . tubes in ears     in the past  . WISDOM TOOTH EXTRACTION      Prior to Admission medications   Medication Sig Start Date End Date Taking? Authorizing Provider  acetaminophen (TYLENOL) 325 MG tablet Take 325-650 mg by mouth every 6 (six) hours as needed for moderate pain or headache.     [provider]  busPIRone (BUSPAR) 15 MG tablet Take 1 tablet (15 mg total) by mouth 2 (two) times daily. 01/19/19   Oletta DarterAgarwal, [place], MD  HYDROcodone-acetaminophen (NORCO/VICODIN) 5-325 MG tablet Take 1 tablet by mouth every 4 (four) hours as needed for moderate pain. 07/26/18   Carolan Shiverintron-Diaz, Edgardo, MD  ibuprofen (ADVIL,MOTRIN) 200 MG tablet Take 400-800 mg by mouth every 6 (six) hours as needed for mild pain or moderate pain.    [provider]  Levonorgestrel 19.5 MG IUD by Intrauterine route. Was place 11/17    [provider]  PARoxetine (PAXIL) 40 MG tablet Take 1 tablet (40 mg total) by mouth daily. 01/19/19  01/19/20  Oletta DarterAgarwal, [place], MD    No Known Allergies  Family History  Problem Relation Age of Onset  . Depression Mother   . Anxiety disorder Mother   . Hypertension Father   . Diabetes Father   . Heart failure Maternal Grandmother   . Hypertension Maternal Grandmother   . Cancer Maternal Grandmother        4 types  . Diabetes Maternal Grandmother   . Hypertension Maternal Grandfather   .  Mesothelioma Maternal Grandfather   . Cancer Paternal Grandmother        uterine    Social History Social History   Tobacco Use  . Smoking status: Current Every Day Smoker    Types: E-cigarettes  . Smokeless tobacco: Never Used  . Tobacco comment: quit prior to preg  Substance Use Topics  . Alcohol use: No    Comment: 1 drink a month  . Drug use: No    Review of Systems Constitutional: Negative for fever. ENT: Negative for recent illness/congestion Cardiovascular: Negative for chest pain. Respiratory: Positive for shortness of breath. Gastrointestinal: Negative for abdominal pain Musculoskeletal: Negative for musculoskeletal complaints Neurological: Negative for headache All other ROS negative  ____________________________________________   PHYSICAL EXAM:  VITAL SIGNS: ED Triage Vitals  Enc Vitals Group     BP 03/18/19 0428 (!) 146/93     Pulse Rate 03/18/19 0428 99     Resp 03/18/19 0428 (!) 22     Temp 03/18/19 0428 97.7 F (36.5 C)     Temp Source 03/18/19 0428 Oral     SpO2 03/18/19 0428 93 %     Weight 03/18/19 0429 115 lb (52.2 kg)     Height 03/18/19 0429 5\' 3"  (1.6 m)     Head Circumference --      Peak Flow --      Pain Score 03/18/19 0429 5     Pain Loc --      Pain Edu? --      Excl. in GC? --     Constitutional: Alert and oriented. Well appearing and in no distress. Eyes: Normal exam ENT      Head: Normocephalic and atraumatic.      Mouth/Throat: Mucous membranes are moist. Cardiovascular: Normal rate, regular rhythm.  Respiratory: Normal respiratory effort, however the patient does have mild expiratory wheezes bilaterally. Gastrointestinal: Soft and nontender. No distention.  Musculoskeletal: Nontender with normal range of motion in all extremities. Neurologic:  Normal speech and language. No gross focal neurologic deficits  Skin:  Skin is warm, dry and intact.  Psychiatric: Mood and affect are normal.    ____________________________________________   RADIOLOGY  Chest x-ray is negative  ____________________________________________   INITIAL IMPRESSION / ASSESSMENT AND PLAN / ED COURSE  Pertinent labs & imaging results that were available during my care of the patient were reviewed by me and considered in my medical decision making (see chart for details).   Patient presents to the emergency department for shortness of breath over the past 3 to 4 days, worse last night.  Patient has expiratory wheezes bilaterally on exam.  Satting as high as 98% at times on room air but averages more around 94%.  Patient denies any underlying asthma or COPD diagnoses.  States she will frequently get wheeze with "bronchitis".  Patient states she feels well at this time.  We will start the patient on prednisone, as a precaution we will swab for coronavirus.  We will dose a DuoNeb in the emergency  department.  We will discharge on albuterol and prednisone.  Patient doing much better after breathing treatments with improved saturation.  We will discharge with prednisone as well as albuterol.  Patient agreeable to plan of care.  I discussed return precautions  RAINEY RODGER was evaluated in Emergency Department on 03/18/2019 for the symptoms described in the history of present illness. She was evaluated in the context of the global COVID-19 pandemic, which necessitated consideration that the patient might be at risk for infection with the SARS-CoV-2 virus that causes COVID-19. Institutional protocols and algorithms that pertain to the evaluation of patients at risk for COVID-19 are in a state of rapid change based on information released by regulatory bodies including the CDC and federal and state organizations. These policies and algorithms were followed during the patient's care in the ED.  ____________________________________________   FINAL CLINICAL IMPRESSION(S) / ED DIAGNOSES  Dyspnea   Harvest Dark, MD 03/18/19 1115

## 2019-03-18 NOTE — ED Triage Notes (Signed)
Patient reports feeling short of breath for 3-4 days.  Patient denies any history of COPD or asthma.  Patient reports history of bronchitis.

## 2019-03-18 NOTE — Discharge Instructions (Addendum)
Please take your steroids starting tomorrow 03/19/2019 as prescribed.  Please use your albuterol inhaler every 4-6 hours as needed for shortness of breath.  Return to the emergency department for any significant shortness of breath, or any other symptom personally concerning to yourself.

## 2019-03-18 NOTE — ED Notes (Signed)
Pt states she has had a cough, headache, and shortness of breath x3 days- no n/v/d

## 2019-03-18 NOTE — ED Notes (Signed)
Dr. Paduchowski at bedside.  

## 2019-03-21 ENCOUNTER — Telehealth: Payer: Self-pay | Admitting: Emergency Medicine

## 2019-03-21 LAB — NOVEL CORONAVIRUS, NAA (HOSP ORDER, SEND-OUT TO REF LAB; TAT 18-24 HRS): SARS-CoV-2, NAA: NOT DETECTED

## 2019-03-21 NOTE — Telephone Encounter (Signed)
Called patient to give covid 19 result--negative.  No answer and no voicemail.

## 2019-04-13 ENCOUNTER — Other Ambulatory Visit: Payer: Self-pay

## 2019-04-13 ENCOUNTER — Ambulatory Visit (INDEPENDENT_AMBULATORY_CARE_PROVIDER_SITE_OTHER): Payer: BC Managed Care – PPO | Admitting: Psychiatry

## 2019-04-13 ENCOUNTER — Encounter (HOSPITAL_COMMUNITY): Payer: Self-pay | Admitting: Psychiatry

## 2019-04-13 DIAGNOSIS — F410 Panic disorder [episodic paroxysmal anxiety] without agoraphobia: Secondary | ICD-10-CM

## 2019-04-13 DIAGNOSIS — F330 Major depressive disorder, recurrent, mild: Secondary | ICD-10-CM

## 2019-04-13 DIAGNOSIS — F411 Generalized anxiety disorder: Secondary | ICD-10-CM

## 2019-04-13 MED ORDER — BUSPIRONE HCL 15 MG PO TABS: 15.0000 mg | ORAL_TABLET | Freq: Two times a day (BID) | ORAL | 3 refills | Status: DC

## 2019-04-13 MED ORDER — PAROXETINE HCL 40 MG PO TABS: 40.0000 mg | ORAL_TABLET | Freq: Every day | ORAL | 3 refills | Status: DC

## 2019-04-13 NOTE — Progress Notes (Signed)
Virtual Visit via Telephone Note  I connected with Allison Bernard on 04/13/19 at  4:00 PM EDT by telephone and verified that I am speaking with the correct person using two identifiers.  Location: Patient: home Provider: office   I discussed the limitations, risks, security and privacy concerns of performing an evaluation and management service by telephone and the availability of in person appointments. I also discussed with the patient that there may be a patient responsible charge related to this service. The patient expressed understanding and agreed to proceed.   History of Present Illness: "I have been ok. I am getting used to the new normal". She has stress from school that causes anxiety but she is managing. At home they have developed a new routine. Pt struggles with finding childcare. Pt had her first panic attacks after several months yesterday. Pt has not really been feeling depressed. She has moments where she is sad about the current situation of the world.      Observations/Objective: I spoke with Allison Bernard on the phone.  Pt was calm, pleasant and cooperative.  Pt was engaged in the conversation and answered questions appropriately.  Speech was clear and coherent with normal rate, tone and volume.  Mood is euythmic, affect is congruent. Thought processes are coherent, goal oriented and intact.  Thought content is logical.  Pt denies SI/HI.   Pt denies auditory and visual hallucinations and did not appear to be responding to internal stimuli.  Memory and concentration are good.  Fund of knowledge and use of language are average.  Insight and judgment are fair.  I am unable to comment on psychomotor activity, general appearance, hygiene, or eye contact as I was unable to physically see the patient on the phone.  Vital signs not available since interview conducted virtually.    Assessment and Plan: MDD- recurrent, mild; Panic d/o without agoraphobia; GAD; ADHD-inattentive  type   Paxil 40mg  po qD Bspar 15mg  po BID  Follow Up Instructions: In 3 months or sooner if needed   I discussed the assessment and treatment plan with the patient. The patient was provided an opportunity to ask questions and all were answered. The patient agreed with the plan and demonstrated an understanding of the instructions.   The patient was advised to call back or seek an in-person evaluation if the symptoms worsen or if the condition fails to improve as anticipated.  I provided 15 minutes of non-face-to-face time during this encounter.   Charlcie Cradle, MD

## 2019-05-01 ENCOUNTER — Ambulatory Visit (HOSPITAL_COMMUNITY): Payer: BC Managed Care – PPO | Admitting: Licensed Clinical Social Worker

## 2019-07-06 ENCOUNTER — Ambulatory Visit (INDEPENDENT_AMBULATORY_CARE_PROVIDER_SITE_OTHER): Payer: BC Managed Care – PPO | Admitting: Psychiatry

## 2019-07-06 ENCOUNTER — Other Ambulatory Visit: Payer: Self-pay

## 2019-07-06 ENCOUNTER — Encounter (HOSPITAL_COMMUNITY): Payer: Self-pay | Admitting: Psychiatry

## 2019-07-06 DIAGNOSIS — F411 Generalized anxiety disorder: Secondary | ICD-10-CM | POA: Diagnosis not present

## 2019-07-06 DIAGNOSIS — F410 Panic disorder [episodic paroxysmal anxiety] without agoraphobia: Secondary | ICD-10-CM | POA: Diagnosis not present

## 2019-07-06 DIAGNOSIS — F330 Major depressive disorder, recurrent, mild: Secondary | ICD-10-CM | POA: Diagnosis not present

## 2019-07-06 MED ORDER — BUSPIRONE HCL 15 MG PO TABS: 15.0000 mg | ORAL_TABLET | Freq: Two times a day (BID) | ORAL | 3 refills | Status: DC

## 2019-07-06 MED ORDER — PAROXETINE HCL 40 MG PO TABS: 60.0000 mg | ORAL_TABLET | Freq: Every day | ORAL | 2 refills | Status: DC
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
## 2019-09-14 ENCOUNTER — Encounter (HOSPITAL_COMMUNITY): Payer: Self-pay | Admitting: Psychiatry

## 2019-09-14 ENCOUNTER — Ambulatory Visit (INDEPENDENT_AMBULATORY_CARE_PROVIDER_SITE_OTHER): Payer: BC Managed Care – PPO | Admitting: Psychiatry

## 2019-09-14 ENCOUNTER — Other Ambulatory Visit: Payer: Self-pay

## 2019-09-14 DIAGNOSIS — F330 Major depressive disorder, recurrent, mild: Secondary | ICD-10-CM | POA: Diagnosis not present

## 2019-09-14 DIAGNOSIS — F410 Panic disorder [episodic paroxysmal anxiety] without agoraphobia: Secondary | ICD-10-CM | POA: Diagnosis not present

## 2019-09-14 DIAGNOSIS — F411 Generalized anxiety disorder: Secondary | ICD-10-CM

## 2019-09-14 MED ORDER — BUSPIRONE HCL 15 MG PO TABS: 15.0000 mg | ORAL_TABLET | Freq: Two times a day (BID) | ORAL | 3 refills | Status: DC

## 2019-09-14 MED ORDER — PAROXETINE HCL 40 MG PO TABS: 60.0000 mg | ORAL_TABLET | Freq: Every day | ORAL | 3 refills | Status: DC
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
## 2019-12-07 ENCOUNTER — Other Ambulatory Visit: Payer: Self-pay

## 2019-12-07 ENCOUNTER — Encounter (HOSPITAL_COMMUNITY): Payer: Self-pay | Admitting: Psychiatry

## 2019-12-07 ENCOUNTER — Ambulatory Visit (HOSPITAL_COMMUNITY): Payer: BC Managed Care – PPO | Admitting: Psychiatry

## 2019-12-07 DIAGNOSIS — F330 Major depressive disorder, recurrent, mild: Secondary | ICD-10-CM

## 2019-12-07 DIAGNOSIS — F410 Panic disorder [episodic paroxysmal anxiety] without agoraphobia: Secondary | ICD-10-CM

## 2019-12-07 DIAGNOSIS — F411 Generalized anxiety disorder: Secondary | ICD-10-CM

## 2019-12-07 MED ORDER — PAROXETINE HCL 40 MG PO TABS: 60.0000 mg | ORAL_TABLET | Freq: Every day | ORAL | 3 refills | Status: DC

## 2019-12-07 MED ORDER — BUSPIRONE HCL 15 MG PO TABS: 15.0000 mg | ORAL_TABLET | Freq: Two times a day (BID) | ORAL | 3 refills | Status: DC

## 2019-12-07 NOTE — Progress Notes (Signed)
Virtual Visit via Telephone Note  I connected with Allison Bernard on 12/07/19 at 11:30 AM EST by telephone and verified that I am speaking with the correct person using two identifiers.  Location: Patient: home Provider: office   I discussed the limitations, risks, security and privacy concerns of performing an evaluation and management service by telephone and the availability of in person appointments. I also discussed with the patient that there may be a patient responsible charge related to this service. The patient expressed understanding and agreed to proceed.   History of Present Illness: Allison Bernard shares she is doing well. Her anxiety is mild and management. She has stress induced panic attacks and are not frequent. Her depression is ok but she is noticing problems with lack of motivation, decreased concentration and poor appetite. Allison Bernard is only eating 1 meal a day and wonders if it is a SE of meds. she trying to snack in the day and eat one meal and dinner. She denies sadness, crying spells or anhedonia. She denies SI/HI. Her sleep has been poor for the one month. She has bought some headphones to decrease the volume of her son's screams. It has helped a lot.    Observations/Objective:  General Appearance: unable to assess  Eye Contact:  unable to assess  Speech:  Clear and Coherent and Normal Rate  Volume:  Normal  Mood:  Depressed  Affect:  Full Range  Thought Process:  Goal Directed, Linear, and Descriptions of Associations: Intact  Orientation:  Full (Time, Place, and Person)  Thought Content:  Logical  Suicidal Thoughts:  No  Homicidal Thoughts:  No  Memory:  Immediate;   Good  Judgement:  Good  Insight:  Good  Psychomotor Activity: unable to assess  Concentration:  Concentration: Good  Recall:  Good  Fund of Knowledge:  Good  Language:  Good  Akathisia:  unable to assess  Handed:  Right  AIMS (if indicated):     Assets:  Communication Skills Desire for  Improvement Financial Resources/Insurance Housing Intimacy Resilience Social Support Talents/Skills Transportation Vocational/Educational  ADL's:  unable to assess  Cognition:  WNL  Sleep:         Assessment and Plan: 1. Mild episode of recurrent major depressive disorder (HCC)  - busPIRone (BUSPAR) 15 MG tablet; Take 1 tablet (15 mg total) by mouth 2 (two) times daily.  Dispense: 60 tablet; Refill: 3 - PARoxetine (PAXIL) 40 MG tablet; Take 1.5 tablets (60 mg total) by mouth daily.  Dispense: 45 tablet; Refill: 3  2. GAD (generalized anxiety disorder)  - busPIRone (BUSPAR) 15 MG tablet; Take 1 tablet (15 mg total) by mouth 2 (two) times daily.  Dispense: 60 tablet; Refill: 3 - PARoxetine (PAXIL) 40 MG tablet; Take 1.5 tablets (60 mg total) by mouth daily.  Dispense: 45 tablet; Refill: 3  3. Panic disorder without agoraphobia  - PARoxetine (PAXIL) 40 MG tablet; Take 1.5 tablets (60 mg total) by mouth daily.  Dispense: 45 tablet; Refill: 3  4. R/o Misphonia Using headphones- helpful  - discussed her poor appetite. She doesn't feel hungry and therefore doesn't eat. At some point she notices that she is extremely hungry and get nauseous because she waited to long. We talked about setting reminders on her phone to eat thru out the day.    Follow Up Instructions: In 3 months or sooner if needed   I discussed the assessment and treatment plan with the patient. The patient was provided an opportunity to ask questions  and all were answered. The patient agreed with the plan and demonstrated an understanding of the instructions.   The patient was advised to call back or seek an in-person evaluation if the symptoms worsen or if the condition fails to improve as anticipated.  I provided 20 minutes of non-face-to-face time during this encounter.   Charlcie Cradle, MD

## 2019-12-09 ENCOUNTER — Ambulatory Visit: Payer: BC Managed Care – PPO | Attending: Internal Medicine

## 2019-12-09 DIAGNOSIS — Z23 Encounter for immunization: Secondary | ICD-10-CM

## 2019-12-09 NOTE — Progress Notes (Signed)
   Covid-19 Vaccination Clinic  Name:  Allison Bernard    MRN: [id number] DOB: [birth year]  12/09/2019  [name] was observed post Covid-19 immunization for 15 minutes without incident. She was provided with Vaccine Information Sheet and instruction to access the V-Safe system.   [name] was instructed to call 911 with any severe reactions post vaccine: Marland Kitchen Difficulty breathing  . Swelling of face and throat  . A fast heartbeat  . A bad rash all over body  . Dizziness and weakness   Immunizations Administered    Name Date Dose VIS Date Route   Pfizer COVID-19 Vaccine 12/09/2019 10:03 AM 0.3 mL 09/08/2019 Intramuscular   Manufacturer: [employer], [employer]   Lot: [id number]   NDC: [address]-7

## 2020-01-02 ENCOUNTER — Ambulatory Visit: Payer: BC Managed Care – PPO | Attending: Internal Medicine

## 2020-01-02 DIAGNOSIS — Z23 Encounter for immunization: Secondary | ICD-10-CM

## 2020-01-02 NOTE — Progress Notes (Signed)
   Covid-19 Vaccination Clinic  Name:  Allison Bernard    MRN: [id number] DOB: 11/22/1995  01/02/2020  [name] was observed post Covid-19 immunization for 15 minutes without incident. She was provided with Vaccine Information Sheet and instruction to access the V-Safe system.   [name] was instructed to call 911 with any severe reactions post vaccine: Marland Kitchen Difficulty breathing  . Swelling of face and throat  . A fast heartbeat  . A bad rash all over body  . Dizziness and weakness   Immunizations Administered    Name Date Dose VIS Date Route   Pfizer COVID-19 Vaccine 01/02/2020 10:10 AM 0.3 mL 09/08/2019 Intramuscular   Manufacturer: [employer], [employer]   Lot: [id number]   NDC: [address]-3

## 2020-02-03 IMAGING — CT CT ABD-PELV W/ CM
2 of 4 series · 16 of 46 positions shown, 18 images · IV contrast (APPLIED)
Comparison: CT scan of October 02, 2012.

CLINICAL DATA: Acute lower abdominal pain.

EXAM:
CT ABDOMEN AND PELVIS WITH CONTRAST
TECHNIQUE: Multidetector CT imaging of the abdomen and pelvis was performed
using the standard protocol following bolus administration of
intravenous contrast.
CONTRAST:  75mL OMNIPAQUE IOHEXOL 300 MG/ML  SOLN

[Series 2: routine abd/pel with · axial · 0.67mm/px · z∈[-434,-89]mm · 13 of 76 slices shown, 15 images]
[im 4/76  soft-tissue]
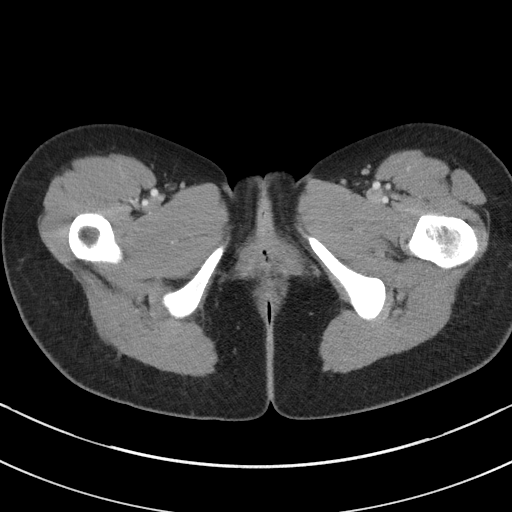
[im 4/76  bone]
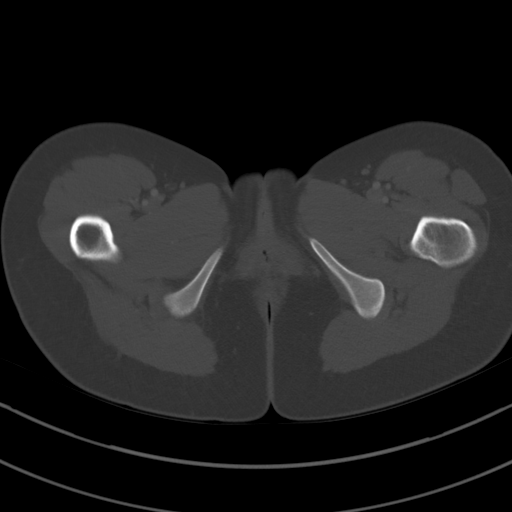
[im 10/76  soft-tissue]
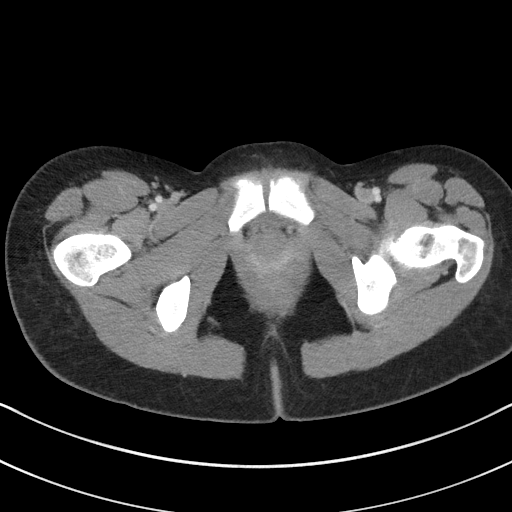
[im 16/76  soft-tissue]
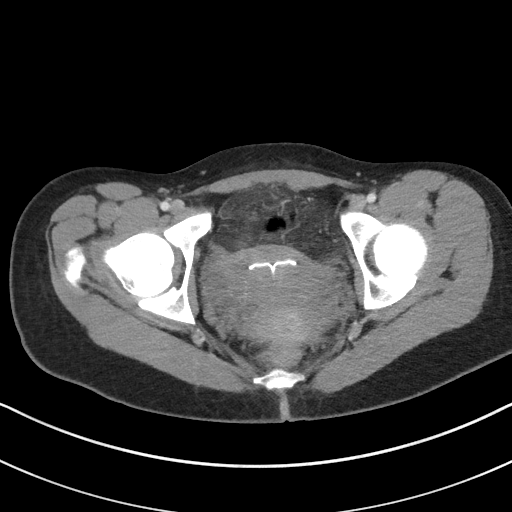
[im 22/76  soft-tissue]
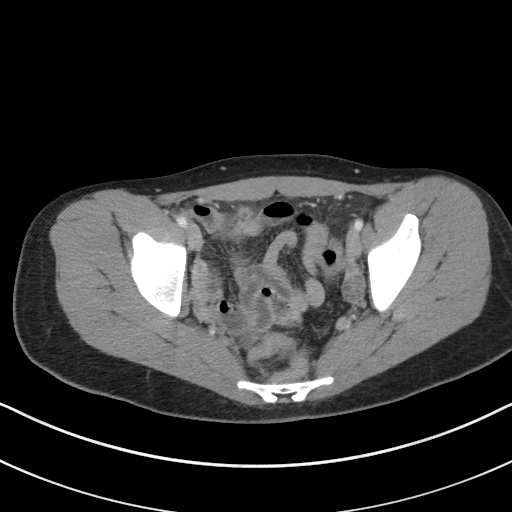
[im 28/76  soft-tissue]
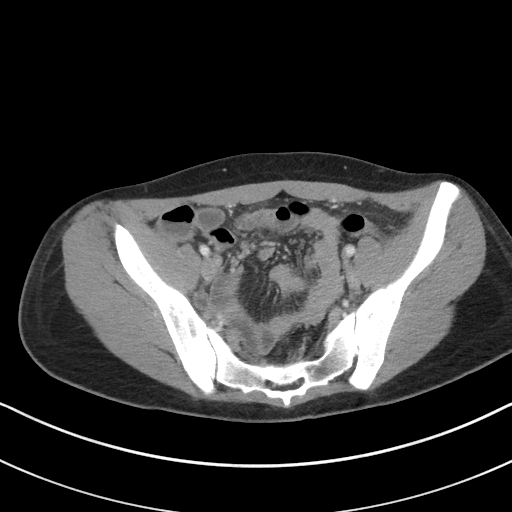
[im 34/76  soft-tissue]
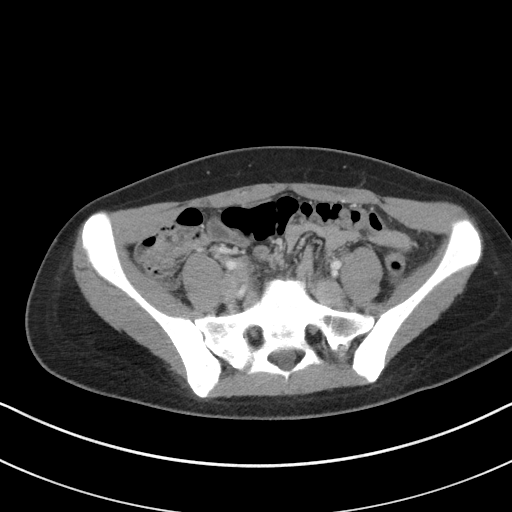
[im 40/76  soft-tissue]
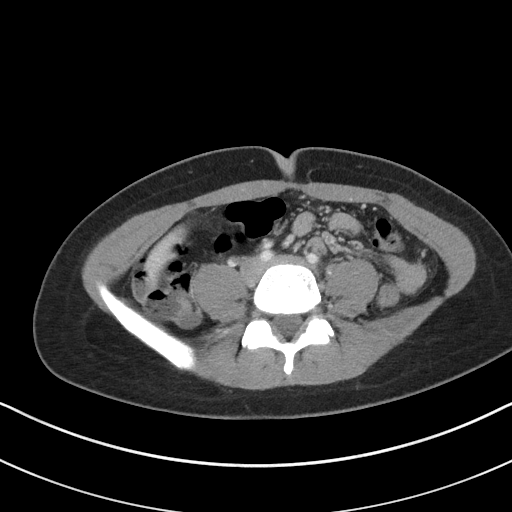
[im 43/76  soft-tissue]
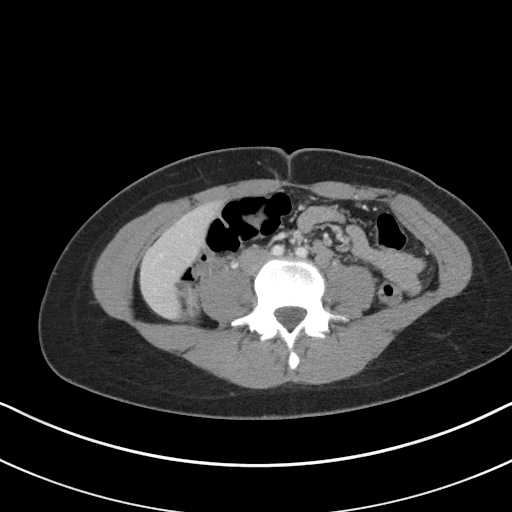
[im 49/76  soft-tissue]
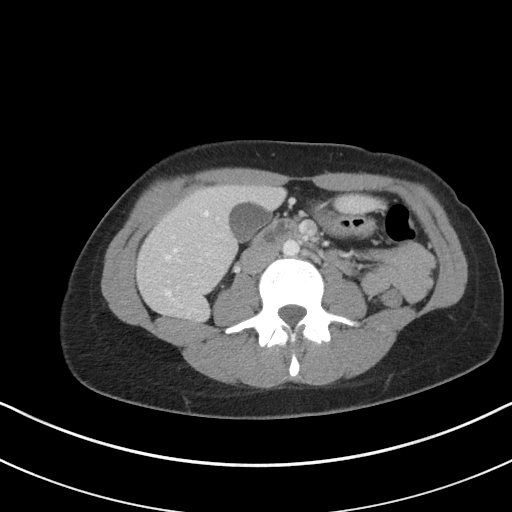
[im 49/76  bone]
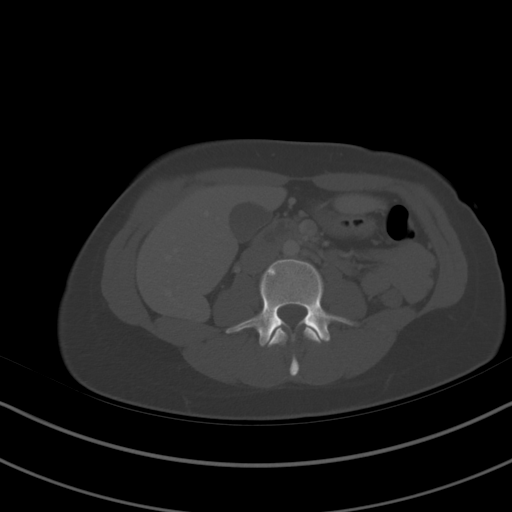
[im 55/76  soft-tissue]
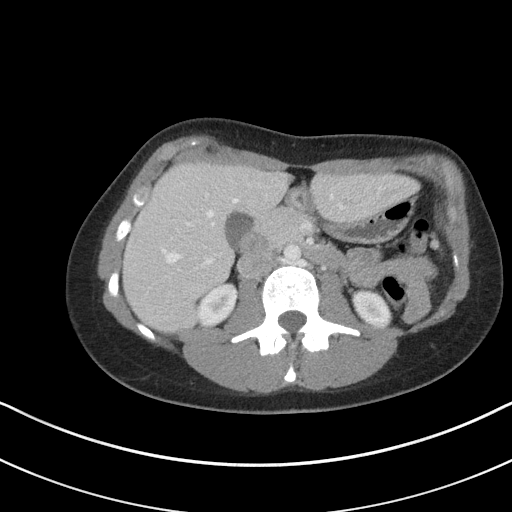
[im 61/76  soft-tissue]
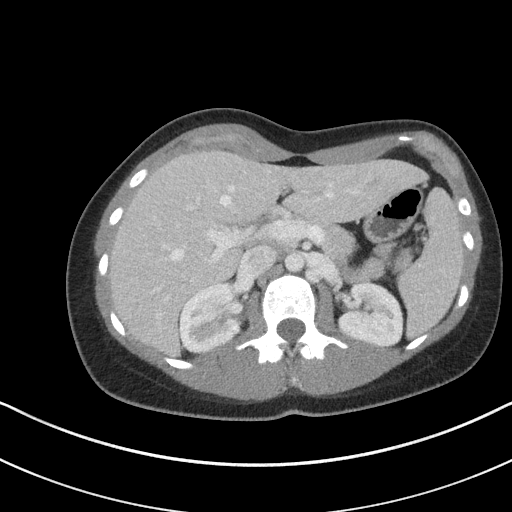
[im 67/76  soft-tissue]
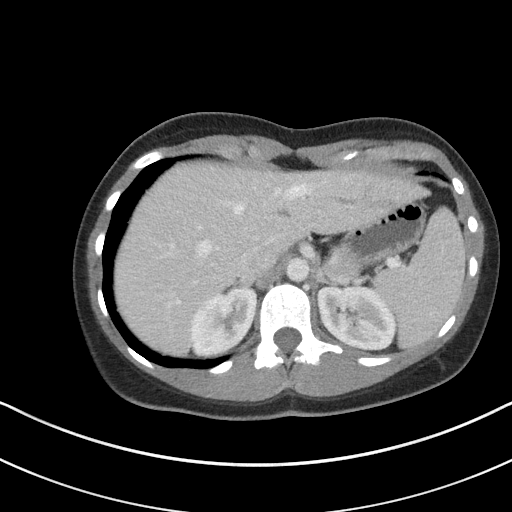
[im 73/76  soft-tissue]
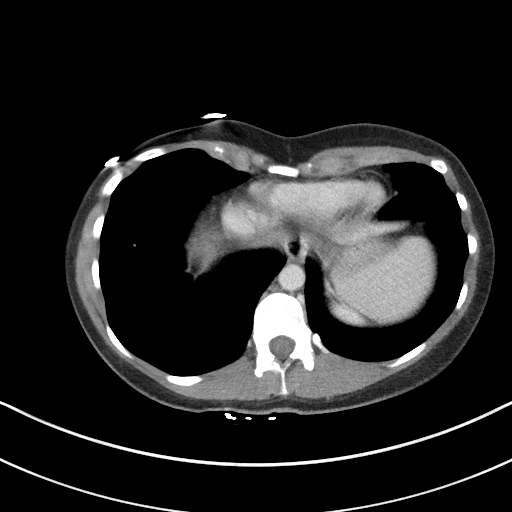

[Series 5: coronal st · coronal · 0.70mm/px · 3 of 67 slices shown]
[im 23/67  soft-tissue]
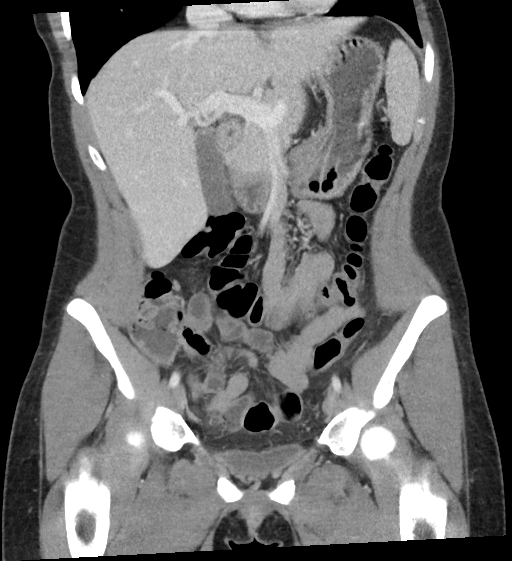
[im 30/67  soft-tissue]
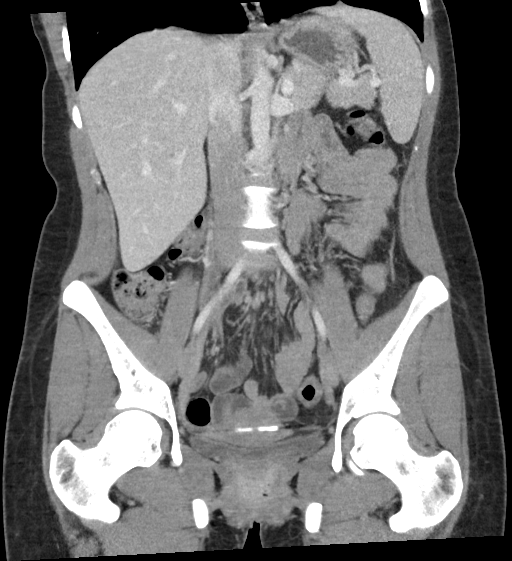
[im 37/67  soft-tissue]
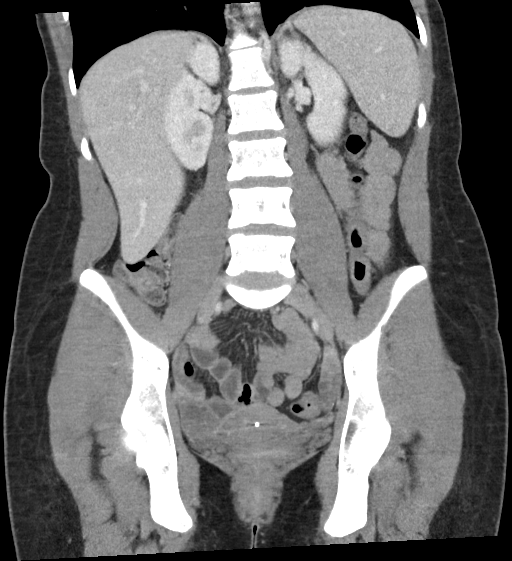

[16 of 46 positions shown; findings below may reference images not displayed]

FINDINGS: Lower chest: No acute abnormality.

Hepatobiliary: No focal liver abnormality is seen. No gallstones,
gallbladder wall thickening, or biliary dilatation.

Pancreas: Unremarkable. No pancreatic ductal dilatation or
surrounding inflammatory changes.

Spleen: Normal in size without focal abnormality.

Adrenals/Urinary Tract: Adrenal glands are unremarkable. Kidneys are
normal, without renal calculi, focal lesion, or hydronephrosis.
Bladder is unremarkable.

Stomach/Bowel: Stomach appears normal. There is no evidence of bowel
obstruction. The appendix is enlarged with surrounding inflammation
consistent with acute appendicitis.

Appendix: Location: Right lower quadrant.

Diameter: 10 mm.

Appendicolith: No.

Mucosal hyper-enhancement: No.

Extraluminal gas: No.

Periappendiceal collection: No.

Vascular/Lymphatic: No significant vascular findings are present. No
enlarged abdominal or pelvic lymph nodes.

Reproductive: Intrauterine device is noted. No adnexal abnormality
is noted.

Other: No abdominal wall hernia or abnormality. No abdominopelvic
ascites.

Musculoskeletal: No acute or significant osseous findings.
IMPRESSION: Findings consistent with acute appendicitis.  No abscess is noted.

## 2020-03-07 ENCOUNTER — Ambulatory Visit (HOSPITAL_COMMUNITY): Payer: BC Managed Care – PPO | Admitting: Psychiatry

## 2020-03-07 ENCOUNTER — Telehealth (HOSPITAL_COMMUNITY): Payer: Self-pay | Admitting: Psychiatry

## 2020-03-07 ENCOUNTER — Other Ambulatory Visit: Payer: Self-pay

## 2020-03-07 NOTE — Telephone Encounter (Signed)
I called patient at our scheduled appointment time. I called both lines listed in the chart. There was no answer and no way to leave a VM. I was unable to speak with Allison Bernard today.

## 2020-10-10 ENCOUNTER — Other Ambulatory Visit (HOSPITAL_COMMUNITY): Payer: Self-pay | Admitting: Psychiatry

## 2020-10-10 DIAGNOSIS — F411 Generalized anxiety disorder: Secondary | ICD-10-CM

## 2020-10-10 DIAGNOSIS — F330 Major depressive disorder, recurrent, mild: Secondary | ICD-10-CM

## 2020-10-10 DIAGNOSIS — F410 Panic disorder [episodic paroxysmal anxiety] without agoraphobia: Secondary | ICD-10-CM

## 2020-11-12 ENCOUNTER — Other Ambulatory Visit (HOSPITAL_COMMUNITY): Payer: Self-pay | Admitting: Psychiatry

## 2020-11-12 DIAGNOSIS — F330 Major depressive disorder, recurrent, mild: Secondary | ICD-10-CM

## 2020-11-12 DIAGNOSIS — F411 Generalized anxiety disorder: Secondary | ICD-10-CM

## 2020-11-12 DIAGNOSIS — F410 Panic disorder [episodic paroxysmal anxiety] without agoraphobia: Secondary | ICD-10-CM

## 2021-02-20 ENCOUNTER — Telehealth (HOSPITAL_COMMUNITY): Payer: BC Managed Care – PPO | Admitting: Psychiatry

## 2021-03-06 ENCOUNTER — Telehealth (INDEPENDENT_AMBULATORY_CARE_PROVIDER_SITE_OTHER): Payer: 59 | Admitting: Psychiatry

## 2021-03-06 ENCOUNTER — Other Ambulatory Visit: Payer: Self-pay

## 2021-03-06 DIAGNOSIS — F411 Generalized anxiety disorder: Secondary | ICD-10-CM | POA: Diagnosis not present

## 2021-03-06 DIAGNOSIS — F330 Major depressive disorder, recurrent, mild: Secondary | ICD-10-CM

## 2021-03-06 DIAGNOSIS — F410 Panic disorder [episodic paroxysmal anxiety] without agoraphobia: Secondary | ICD-10-CM | POA: Diagnosis not present

## 2021-03-06 MED ORDER — BUSPIRONE HCL 10 MG PO TABS: 20.0000 mg | ORAL_TABLET | Freq: Two times a day (BID) | ORAL | 2 refills | Status: DC

## 2021-03-06 MED ORDER — PAROXETINE HCL 40 MG PO TABS: 60.0000 mg | ORAL_TABLET | Freq: Every day | ORAL | 2 refills | Status: DC
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
## 2021-05-08 ENCOUNTER — Other Ambulatory Visit: Payer: Self-pay

## 2021-05-08 ENCOUNTER — Telehealth (INDEPENDENT_AMBULATORY_CARE_PROVIDER_SITE_OTHER): Payer: 59 | Admitting: Psychiatry

## 2021-05-08 DIAGNOSIS — F411 Generalized anxiety disorder: Secondary | ICD-10-CM

## 2021-05-08 DIAGNOSIS — F330 Major depressive disorder, recurrent, mild: Secondary | ICD-10-CM | POA: Diagnosis not present

## 2021-05-08 DIAGNOSIS — F410 Panic disorder [episodic paroxysmal anxiety] without agoraphobia: Secondary | ICD-10-CM

## 2021-05-08 MED ORDER — PAROXETINE HCL 40 MG PO TABS: 60.0000 mg | ORAL_TABLET | Freq: Every day | ORAL | 2 refills | Status: DC

## 2021-05-08 MED ORDER — BUSPIRONE HCL 10 MG PO TABS: 20.0000 mg | ORAL_TABLET | Freq: Two times a day (BID) | ORAL | 2 refills | Status: DC

## 2021-05-08 NOTE — Progress Notes (Signed)
Virtual Visit via Telephone Note  I connected with Allison Bernard on 05/08/21 at  9:30 AM EDT by telephone and verified that I am speaking with the correct person using two identifiers. Allison Bernard is currently driving so we could not connect by video  Location: Patient: driving in car Provider: office   I discussed the limitations, risks, security and privacy concerns of performing an evaluation and management service by telephone and the availability of in person appointments. I also discussed with the patient that there may be a patient responsible charge related to this service. The patient expressed understanding and agreed to proceed.   History of Present Illness: Allison Bernard has been very stressed lately. "I am not great but I am stable". She experienced "never ending anxiety" but is coping with it. She doesn't think that any medication can help due to nature of her stressors, both personal and domestic". She is having tachycardia, insomnia, fatigue, body aches and racing thoughts. Allison Bernard continues to experience stress induced panic attacks. Her general outlook is not that great right now. She is depressed but is managing and feels it is appropriate to the situation. She is endorsing anhedonia. Her appetite is poor but she is making sure she eats 1 meal/day and some small snacks. Allison Bernard finds herself restricting her food to a few different ones. Allison Bernard denies SI/HI. Allison Bernard gets up and does what she to daily. She is future oriented.    Observations/Objective:  General Appearance: unable to assess  Eye Contact:  unable to assess  Speech:  Clear and Coherent and Normal Rate  Volume:  Normal  Mood:  Anxious and Depressed  Affect:  Congruent  Thought Process:  Goal Directed, Linear, and Descriptions of Associations: Intact  Orientation:  Full (Time, Place, and Person)  Thought Content:  Logical  Suicidal Thoughts:  No  Homicidal Thoughts:  No  Memory:  Immediate;   Good  Judgement:  Good   Insight:  Good  Psychomotor Activity: unable to assess  Concentration:  Concentration: Good  Recall:  Good  Fund of Knowledge:  Good  Language:  Good  Akathisia:  unable to assess  Handed:  unable to assess  AIMS (if indicated):     Assets:  Communication Skills Desire for Improvement Financial Resources/Insurance Housing Intimacy Leisure Time Resilience Social Support Talents/Skills Transportation Vocational/Educational  ADL's:  unable to assess  Cognition:  WNL  Sleep:        Assessment and Plan: Depression screen [facility] 2/9 05/08/2021 03/06/2021 06/06/2015  Decreased Interest 3 0 1  Down, Depressed, Hopeless 3 2 3   PHQ - 2 Score 6 2 4   Altered sleeping 3 1 1   Tired, decreased energy 3 1 1   Change in appetite 3 1 3   Feeling bad or failure about yourself  0 0 3  Trouble concentrating 0 2 0  Moving slowly or fidgety/restless 0 0 0  Suicidal thoughts 0 0 1  PHQ-9 Score 15 7 13   Difficult doing work/chores Somewhat difficult Somewhat difficult Very difficult    Flowsheet Row Video Visit from 05/08/2021 in [facility] Video Visit from 03/06/2021 in [facility]  C-SSRS RISK CATEGORY No Risk No Risk      - [name] does not want her meds changed at this time. The meds are effective to the extent that they can be.  - she would like to start therapy and I will refer her to some   1. Mild episode of recurrent major depressive  disorder (HCC) - PARoxetine (PAXIL) 40 MG tablet; Take 1.5 tablets (60 mg total) by mouth daily.  Dispense: 45 tablet; Refill: 2 - busPIRone (BUSPAR) 10 MG tablet; Take 2 tablets (20 mg total) by mouth 2 (two) times daily.  Dispense: 120 tablet; Refill: 2  2. GAD (generalized anxiety disorder) - PARoxetine (PAXIL) 40 MG tablet; Take 1.5 tablets (60 mg total) by mouth daily.  Dispense: 45 tablet; Refill: 2 - busPIRone (BUSPAR) 10 MG tablet; Take 2 tablets (20 mg total) by mouth 2  (two) times daily.  Dispense: 120 tablet; Refill: 2  3. Panic disorder without agoraphobia - PARoxetine (PAXIL) 40 MG tablet; Take 1.5 tablets (60 mg total) by mouth daily.  Dispense: 45 tablet; Refill: 2      Follow Up Instructions: In 4-8 weeks or sooner if needed   I discussed the assessment and treatment plan with the patient. The patient was provided an opportunity to ask questions and all were answered. The patient agreed with the plan and demonstrated an understanding of the instructions.   The patient was advised to call back or seek an in-person evaluation if the symptoms worsen or if the condition fails to improve as anticipated.  I provided 13 minutes of non-face-to-face time during this encounter.   Oletta Darter, MD

## 2021-07-10 ENCOUNTER — Other Ambulatory Visit: Payer: Self-pay

## 2021-07-10 ENCOUNTER — Telehealth (HOSPITAL_COMMUNITY): Payer: Medicaid Other | Admitting: Psychiatry

## 2021-07-10 ENCOUNTER — Telehealth (HOSPITAL_COMMUNITY): Payer: Self-pay | Admitting: Psychiatry

## 2021-07-10 NOTE — Telephone Encounter (Signed)
I called the patient at our scheduled appointment time. There was no answer. I left a voice message for patient to call the clinic back at their convinence.   

## 2021-11-27 ENCOUNTER — Telehealth (HOSPITAL_COMMUNITY): Payer: Medicaid Other | Admitting: Psychiatry

## 2021-12-04 ENCOUNTER — Telehealth (HOSPITAL_COMMUNITY): Payer: Medicaid Other | Admitting: Psychiatry

## 2021-12-04 ENCOUNTER — Telehealth (HOSPITAL_COMMUNITY): Payer: Self-pay | Admitting: Psychiatry

## 2021-12-04 ENCOUNTER — Other Ambulatory Visit: Payer: Self-pay

## 2021-12-04 NOTE — Telephone Encounter (Signed)
I called the patient at our scheduled appointment time. The call went straight to voicemail but it was full so I was not able to leave a message.  ?

## 2021-12-11 ENCOUNTER — Other Ambulatory Visit: Payer: Self-pay

## 2021-12-11 ENCOUNTER — Encounter (HOSPITAL_COMMUNITY): Payer: Self-pay | Admitting: Psychiatry

## 2021-12-11 ENCOUNTER — Telehealth (HOSPITAL_BASED_OUTPATIENT_CLINIC_OR_DEPARTMENT_OTHER): Payer: Medicaid Other | Admitting: Psychiatry

## 2021-12-11 DIAGNOSIS — F330 Major depressive disorder, recurrent, mild: Secondary | ICD-10-CM

## 2021-12-11 DIAGNOSIS — F410 Panic disorder [episodic paroxysmal anxiety] without agoraphobia: Secondary | ICD-10-CM

## 2021-12-11 DIAGNOSIS — F411 Generalized anxiety disorder: Secondary | ICD-10-CM | POA: Diagnosis not present

## 2021-12-11 MED ORDER — PAROXETINE HCL 40 MG PO TABS: 60.0000 mg | ORAL_TABLET | Freq: Every day | ORAL | 2 refills | Status: DC

## 2021-12-11 MED ORDER — BUSPIRONE HCL 10 MG PO TABS: 20.0000 mg | ORAL_TABLET | Freq: Two times a day (BID) | ORAL | 2 refills | Status: DC

## 2021-12-11 NOTE — Progress Notes (Signed)
Virtual Visit via Video Note ? ?I connected with Allison Bernard on 12/11/21 at  2:00 PM EDT by a video enabled telemedicine application and verified that I am speaking with the correct person using two identifiers. ? ?Location: ?Patient: in house ?Provider: office ?  ?I discussed the limitations of evaluation and management by telemedicine and the availability of in person appointments. The patient expressed understanding and agreed to proceed. ? ?History of Present Illness: ?Allison Bernard is graduating [employer] in May and is only taking 1 class right now. She is working part time and is looking for a full time job. 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 Allison Bernard was taking classes at [employer] but has put it hold temporarily.her son has been diagnosed with autism. It has opened a lot of services for them and they have noticed that he is now thriving. Allison Bernard gets burdened by things that are happening in society, especially women's reproduction rights. It makes her depressed at times and her meds are working well. She is utilizing her supports, as well. She has can have 3-4 days of depression depending on the news. Some weeks are better than others. On bad days she will feel sad for a few hours but continues with her daily tasks. It gets better when her son gets home from school because he keeps her distracted. She denies anhedonia. Her sleep is affected by her son's sleep pattern. Allison Bernard feels exhausted everyday. Her appetite is ok- she is eating 2 meals/day and some snacks. It often feels like a lack of motivation to eat and then strong feelings about liking some food or not. When she gets a craving she finds it difficult to eat something else. She does not intentionally restrict food. She is not focused on her weight or how her body looks. Allison Bernard notes that she can hyper focused on things she likes but finds it difficult to focus on other things. Allison Bernard denies negative self talks. She is denying panic attacks and is  thinking she is having more "melt downs". It happens multiple times a week. Triggers are loud sounds or feeling overwhelmed. She has continued general anxiety and things like social interactions or changes in schedule really contribute. It is manageable.  ?  ?Observations/Objective: ?Psychiatric Specialty Exam: ?ROS  ?currently breastfeeding.There is no height or weight on file to calculate BMI.  ?General Appearance: Casual and Fairly Groomed  ?Eye Contact:  Good  ?Speech:  Clear and Coherent and Normal Rate  ?Volume:  Normal  ?Mood:  Euthymic  ?Affect:  Full Range  ?Thought Process:  Goal Directed, Linear, and Descriptions of Associations: Intact  ?Orientation:  Full (Time, Place, and Person)  ?Thought Content:  Logical  ?Suicidal Thoughts:  No  ?Homicidal Thoughts:  No  ?Memory:  Immediate;   Good  ?Judgement:  Good  ?Insight:  Good  ?Psychomotor Activity:  Normal  ?Concentration:  Concentration: Good  ?Recall:  Good  ?Fund of Knowledge:  Good  ?Language:  Good  ?Akathisia:  No  ?Handed:  Right  ?AIMS (if indicated):     ?Assets:  Communication Skills ?Desire for Improvement ?Financial Resources/Insurance ?Housing ?Intimacy ?Leisure Time ?Resilience ?Social Support ?Talents/Skills ?Transportation ?Vocational/Educational  ?ADL's:  Intact  ?Cognition:  WNL  ?Sleep:     ? ? ? ?Assessment and Plan: ?Depression screen [facility] 2/9 12/11/2021 05/08/2021 03/06/2021 06/06/2015  ?Decreased Interest 0 3 0 1  ?Down, Depressed, Hopeless 2 3 2 3   ?PHQ - 2 Score 2 6 2 4   ?  Altered sleeping 0 3 1 1   ?Tired, decreased energy 3 3 1 1   ?Change in appetite 0 3 1 3   ?Feeling bad or failure about yourself  0 0 0 3  ?Trouble concentrating 1 0 2 0  ?Moving slowly or fidgety/restless 0 0 0 0  ?Suicidal thoughts 0 0 0 1  ?PHQ-9 Score 6 15 7 13   ?Difficult doing work/chores Somewhat difficult Somewhat difficult Somewhat difficult Very difficult  ? ? ?Flowsheet Row Video [facility] 12[facility]11/2021 in [facility] Video  Visit from 05/08/2021 in [facility] Video Visit from 03/06/2021 in [facility]  ?C-SSRS RISK CATEGORY No Risk No Risk No Risk  ? ?  ? ?- Beatryce is not currently in therapy ? ?- she feels her meds are effective and wants to continue them ? ?- She is considering an autism evaluation for herself.  ? ?1. Mild episode of recurrent major depressive disorder (HCC) ?- busPIRone (BUSPAR) 10 MG tablet; Take 2 tablets (20 mg total) by mouth 2 (two) times daily.  Dispense: 120 tablet; Refill: 2 ?- PARoxetine (PAXIL) 40 MG tablet; Take 1.5 tablets (60 mg total) by mouth daily.  Dispense: 45 tablet; Refill: 2 ? ?2. GAD (generalized anxiety disorder) ?- busPIRone (BUSPAR) 10 MG tablet; Take 2 tablets (20 mg total) by mouth 2 (two) times daily.  Dispense: 120 tablet; Refill: 2 ?- PARoxetine (PAXIL) 40 MG tablet; Take 1.5 tablets (60 mg total) by mouth daily.  Dispense: 45 tablet; Refill: 2 ? ?3. Panic disorder without agoraphobia ?- PARoxetine (PAXIL) 40 MG tablet; Take 1.5 tablets (60 mg total) by mouth daily.  Dispense: 45 tablet; Refill: 2 ? ? ? ?Follow Up Instructions: ?In 2-3 months or sooner ?  ?I discussed the assessment and treatment plan with the patient. The patient was provided an opportunity to ask questions and all were answered. The patient agreed with the plan and demonstrated an understanding of the instructions. ?  ?The patient was advised to call back or seek an in-person evaluation if the symptoms worsen or if the condition fails to improve as anticipated. ? ?I provided 25 minutes of non-face-to-face time during this encounter. ? ? ? , MD ? ?

## 2021-12-12 ENCOUNTER — Emergency Department
Admission: EM | Admit: 2021-12-12 | Discharge: 2021-12-12 | Disposition: A | Discharge status: Home / Self Care | Payer: Medicaid Other | Attending: Emergency Medicine | Admitting: Emergency Medicine

## 2021-12-12 ENCOUNTER — Encounter: Payer: Self-pay | Admitting: Emergency Medicine

## 2021-12-12 DIAGNOSIS — J45901 Unspecified asthma with (acute) exacerbation: Secondary | ICD-10-CM | POA: Insufficient documentation

## 2021-12-12 DIAGNOSIS — J45909 Unspecified asthma, uncomplicated: Secondary | ICD-10-CM | POA: Diagnosis present

## 2021-12-12 MED ORDER — IPRATROPIUM-ALBUTEROL 0.5-2.5 (3) MG/3ML IN SOLN
3.0000 mL | Freq: Once | RESPIRATORY_TRACT | Status: AC
  Administered 2021-12-12: 3 mL via RESPIRATORY_TRACT
  Filled 2021-12-12: qty 3

## 2021-12-12 MED ORDER — PREDNISONE 20 MG PO TABS
40.0000 mg | ORAL_TABLET | Freq: Once | ORAL | Status: AC
  Administered 2021-12-12: 40 mg via ORAL
  Filled 2021-12-12: qty 2

## 2021-12-12 MED ORDER — BUDESONIDE-FORMOTEROL FUMARATE 80-4.5 MCG/ACT IN AERO: 2.0000 | INHALATION_SPRAY | Freq: Two times a day (BID) | RESPIRATORY_TRACT | 0 refills | Status: DC

## 2021-12-12 MED ORDER — PREDNISONE 50 MG PO TABS: ORAL_TABLET | ORAL | 0 refills | Status: DC

## 2021-12-12 MED ORDER — ALBUTEROL SULFATE HFA 108 (90 BASE) MCG/ACT IN AERS: 2.0000 | INHALATION_SPRAY | Freq: Four times a day (QID) | RESPIRATORY_TRACT | 0 refills | Status: DC | PRN

## 2021-12-12 NOTE — ED Provider Notes (Signed)
? ?[facility] ?Provider Note ? ? ? Event Date/Time  ? First MD Initiated Contact with Patient 12/12/21 0305   ?  (approximate) ? ? ?History  ? ?Asthma ? ? ?HPI ? ?Allison Bernard is a 26 y.o. female according to outpatient clinic note from yesterday patient suffers from anxiety and major depression.  Patient also reports history of asthma and anemia ? ?A couple weeks ago ran out of Symbicort and albuterol.  Had a doctor's appointment to get it renewed, but her son got ill so she could not attend this week ? ?Reports she has been wheezing for about 7 days since she ran out of albuterol.  Usually uses her albuterol pretty regularly and Symbicort twice a day. ? ?Increased shortness of breath slightly with wheezing in the last couple days.  Denies fever denies cough no chest pain no fevers. ? ?No chest pain.  Reports history of same multiple times usually would have this treated with albuterol and Symbicort but since she ran out seems to be having ongoing asthma ?  ? ? ?Physical Exam  ? ?Triage Vital Signs: ?ED Triage Vitals  ?Enc Vitals Group  ?   BP 12/12/21 0240 (!) 132/103  ?   Pulse Rate 12/12/21 0240 70  ?   Resp 12/12/21 0240 16  ?   Temp 12/12/21 0240 98.1 ?F (36.7 ?C)  ?   Temp Source 12/12/21 0240 Oral  ?   SpO2 12/12/21 0240 96 %  ?   Weight 12/12/21 0241 112 lb (50.8 kg)  ?   Height 12/12/21 0241 5\' 3"  (1.6 m)  ?   Head Circumference --   ?   Peak Flow --   ?   Pain Score 12/12/21 0244 0  ?   Pain Loc --   ?   Pain Edu? --   ?   Excl. in GC? --   ? ? ?Most recent vital signs: ?Vitals:  ? 12/12/21 0240  ?BP: (!) 132/103  ?Pulse: 70  ?Resp: 16  ?Temp: 98.1 ?F (36.7 ?C)  ?SpO2: 96%  ? ? ? ?General: Awake, no distress.  Does not have any apparent respiratory distress on exam ?CV:  Good peripheral perfusion.  Normal heart tones normal rhythm no rubs or gallops no murmurs ?Resp:  Normal effort.  Mild to moderate end expiratory wheezing throughout all lobes.  No rales.  No rhonchi.  Does not  appear to be in any respiratory distress but does have notable wheezing with respirations ?Abd:  No distention.  ?Other:  No lower extremity edema ? ?Does not appear gravid.  Patient denies pregnancy ? ? ?ED Results / Procedures / Treatments  ? ?Labs ?(all labs ordered are listed, but only abnormal results are displayed) ?Labs Reviewed - No data to display ? ? ?RADIOLOGY ? ? ? ? ?PROCEDURES: ? ?Critical Care performed: No ? ?Procedures ? ? ?MEDICATIONS ORDERED IN ED: ?Medications  ?ipratropium-albuterol (DUONEB) 0.5-2.5 (3) MG/3ML nebulizer solution 3 mL (3 mLs Nebulization Given 12/12/21 0317)  ?ipratropium-albuterol (DUONEB) 0.5-2.5 (3) MG/3ML nebulizer solution 3 mL (3 mLs Nebulization Given 12/12/21 0317)  ?predniSONE (DELTASONE) tablet 40 mg (40 mg Oral Given 12/12/21 0316)  ? ? ? ?IMPRESSION / MDM / ASSESSMENT AND PLAN / ED COURSE  ?I reviewed the triage vital signs and the nursing notes. ?             ?               ? ?  Differential diagnosis includes, but is not limited to, likely asthma exacerbation, likely secondary to running out of her Symbicort as well as her rescue albuterol.  Does use a spacer at home but has no albuterol ? ?We will place patient on steroid, discussed this as well as the potential side effects of prednisone medication, and also provided DuoNebs here with reassessment.  Sent refills for her albuterol and Symbicort to her pharmacy.  Patient agreeable with this plan.  She reports that she feels like after couple breathing treatments if she feels well she would like to go home and to continue her regular medications and follow-up with her new doctor.  He is quite agreeable ? ?Clinical exam shows no evidence to support pneumonia.  She has a known history of asthma with what appears to be mild exacerbation at this time.  Even lung sounds bilaterally, no evidence of chest pain no pleuritic pain, no signs or symptoms that would be suggestive of pulmonary embolism, DVT PE ACS or acute infectious  etiology at this time ? ? ?  ?----------------------------------------- ?4:09 AM on 12/12/2021 ?----------------------------------------- ?Vitals:  ? 12/12/21 0240  ?BP: (!) 132/103  ?Pulse: 70  ?Resp: 16  ?Temp: 98.1 ?F (36.7 ?C)  ?SpO2: 96%  ? ? ? ?Patient resting comfortably.  She reports she feels much better ready for discharge.  Her work of breathing is normal her lung sounds are clear bilaterally now.  She reports she feels back to normal and appears appropriate for discharge. ? ?Return precautions and treatment recommendations and follow-up discussed with the patient who is agreeable with the plan. ? ? ?FINAL CLINICAL IMPRESSION(S) / ED DIAGNOSES  ? ?Final diagnoses:  ?Mild asthma with exacerbation, unspecified whether persistent  ? ? ? ?Rx / DC Orders  ? ?ED Discharge Orders   ? ?      Ordered  ?  budesonide-formoterol (SYMBICORT) 80-4.5 MCG/ACT inhaler  2 times daily       ? 12/12/21 0313  ?  albuterol (VENTOLIN HFA) 108 (90 Base) MCG/ACT inhaler  Every 6 hours PRN       ? 12/12/21 0313  ?  predniSONE (DELTASONE) 50 MG tablet       ? 12/12/21 0313  ? ?  ?  ? ?  ? ? ? ?Note:  This document was prepared using Dragon voice recognition software and may include unintentional dictation errors. ?  Sharyn Creamer, MD ?12/12/21 0409 ? ?

## 2021-12-12 NOTE — ED Triage Notes (Signed)
Patient ambulatory to triage with steady gait, without difficulty or distress noted; pt st out of symbicort and albuterol x month; denies any recent illness, st she is having an asthma flare-up due to season change ?

## 2022-03-05 ENCOUNTER — Telehealth (HOSPITAL_BASED_OUTPATIENT_CLINIC_OR_DEPARTMENT_OTHER): Payer: Medicaid Other | Admitting: Psychiatry

## 2022-03-05 DIAGNOSIS — F411 Generalized anxiety disorder: Secondary | ICD-10-CM

## 2022-03-05 DIAGNOSIS — F330 Major depressive disorder, recurrent, mild: Secondary | ICD-10-CM | POA: Diagnosis not present

## 2022-03-05 DIAGNOSIS — F410 Panic disorder [episodic paroxysmal anxiety] without agoraphobia: Secondary | ICD-10-CM

## 2022-03-05 MED ORDER — PAROXETINE HCL 40 MG PO TABS: 60.0000 mg | ORAL_TABLET | Freq: Every day | ORAL | 2 refills | Status: DC

## 2022-03-05 MED ORDER — BUSPIRONE HCL 10 MG PO TABS: 20.0000 mg | ORAL_TABLET | Freq: Two times a day (BID) | ORAL | 2 refills | Status: DC

## 2022-03-05 NOTE — Progress Notes (Signed)
Virtual Visit via Video Note  I connected with Allison Bernard on 03/05/22 at  9:00 AM EDT by a video enabled telemedicine application and verified that I am speaking with the correct person using two identifiers.  Location: Patient: home Provider: office   I discussed the limitations of evaluation and management by telemedicine and the availability of in person appointments. The patient expressed understanding and agreed to proceed.  History of Present Illness: "I'm actually doing pretty good. I feel pretty ok". Allison Bernard graduated college last month. She is going to get a BA in public health at [employer] this coming fall. Allison Bernard feels she is coping with all the things that are happening. She feels good about where she is at and the  meds she is taking. Allison Bernard thinks the depression is usually situational. She denies hopelessness, isolation and anhedonia. She denies SI/HI. Her sleep and appetite are good. She has gained 10 lbs which is a good thing. Her anxiety is mostly manageable. She has a panic attack about 1x/month. She still has sensory overload and then feels overwhelmed. She is able to distinguish between that and a panic attack. Allison Bernard still wants to get an autism screening in the future.    Observations/Objective: Psychiatric Specialty Exam: ROS  There were no vitals taken for this visit.There is no height or weight on file to calculate BMI.  General Appearance: Neat and Well Groomed  Eye Contact:  Good  Speech:  Clear and Coherent and Normal Rate  Volume:  Normal  Mood:  Euthymic  Affect:  Full Range  Thought Process:  Goal Directed, Linear, and Descriptions of Associations: Intact  Orientation:  Full (Time, Place, and Person)  Thought Content:  Logical  Suicidal Thoughts:  No  Homicidal Thoughts:  No  Memory:  Immediate;   Good  Judgement:  Good  Insight:  Good  Psychomotor Activity:  Normal  Concentration:  Concentration: Good  Recall:  Good  Fund of Knowledge:  Good   Language:  Good  Akathisia:  No  Handed:  Right  AIMS (if indicated):     Assets:  Communication Skills Desire for Improvement Financial Resources/Insurance Housing Intimacy Leisure Time Resilience Social Support Talents/Skills Transportation Vocational/Educational  ADL's:  Intact  Cognition:  WNL  Sleep:        Assessment and Plan:     03/05/2022    2:22 PM 12/11/2021    2:12 PM 05/08/2021    9:41 AM 03/06/2021    9:41 AM 06/06/2015    8:57 AM  Depression screen PHQ 2/9  Decreased Interest 0 0 3 0 1  Down, Depressed, Hopeless 1 2 3 2 3   PHQ - 2 Score 1 2 6 2 4   Altered sleeping  0 3 1 1   Tired, decreased energy  3 3 1 1   Change in appetite  0 3 1 3   Feeling bad or failure about yourself   0 0 0 3  Trouble concentrating  1 0 2 0  Moving slowly or fidgety/restless  0 0 0 0  Suicidal thoughts  0 0 0 1  PHQ-9 Score  6 15 7 13   Difficult doing work/chores  Somewhat difficult Somewhat difficult Somewhat difficult Very difficult    Flowsheet Row Video Visit from 03/05/2022 in [facility] from 12/12/2021 in [place] Video Visit from 12/11/2021 in [place] No Risk No Risk No Risk  The risk of un-intended pregnancy is low based on the fact that pt reports using an IUD. Pt is aware that these meds carry a teratogenic risk. Pt will discuss plan of action if she does or plans to become pregnant in the future.  Status of current problems: stable  Meds:  1. Mild episode of recurrent major depressive disorder (HCC) - busPIRone (BUSPAR) 10 MG tablet; Take 2 tablets (20 mg total) by mouth 2 (two) times daily.  Dispense: 120 tablet; Refill: 2 - PARoxetine (PAXIL) 40 MG tablet; Take 1.5 tablets (60 mg total) by mouth daily.  Dispense: 45 tablet; Refill: 2  2. GAD (generalized anxiety disorder) - busPIRone (BUSPAR) 10 MG tablet;  Take 2 tablets (20 mg total) by mouth 2 (two) times daily.  Dispense: 120 tablet; Refill: 2 - PARoxetine (PAXIL) 40 MG tablet; Take 1.5 tablets (60 mg total) by mouth daily.  Dispense: 45 tablet; Refill: 2  3. Panic disorder without agoraphobia - PARoxetine (PAXIL) 40 MG tablet; Take 1.5 tablets (60 mg total) by mouth daily.  Dispense: 45 tablet; Refill: 2     Labs: none today    Therapy: brief supportive therapy provided.   Collaboration of Care: Other none today  Patient/Guardian was advised Release of Information must be obtained prior to any record release in order to collaborate their care with an outside provider. Patient/Guardian was advised if they have not already done so to contact the registration department to sign all necessary forms in order for [place] to release information regarding their care.   Consent: Patient/Guardian gives verbal consent for treatment and assignment of benefits for services provided during this visit. Patient/Guardian expressed understanding and agreed to proceed.   Follow Up Instructions: Follow up in 203 months or sooner if needed    I discussed the assessment and treatment plan with the patient. The patient was provided an opportunity to ask questions and all were answered. The patient agreed with the plan and demonstrated an understanding of the instructions.   The patient was advised to call back or seek an in-person evaluation if the symptoms worsen or if the condition fails to improve as anticipated.  I provided 10 minutes of non-face-to-face time during this encounter.   Charlcie Cradle, MD

## 2022-06-04 ENCOUNTER — Telehealth (HOSPITAL_COMMUNITY): Payer: Medicaid Other | Admitting: Psychiatry

## 2022-06-04 ENCOUNTER — Telehealth (HOSPITAL_COMMUNITY): Payer: Self-pay | Admitting: Psychiatry

## 2022-06-04 NOTE — Telephone Encounter (Signed)
Patient was not present on video platform used through mychart. I called the patient at our scheduled appointment time. There was no answer. I left a voice message for patient to call the clinic back at their convinence. There was no return phone call during out scheduled visit time. I was not able to speak with the patient today, as they were a no show for their scheduled appointment.   

## 2023-02-01 ENCOUNTER — Encounter (HOSPITAL_COMMUNITY): Payer: Self-pay

## 2023-05-27 ENCOUNTER — Ambulatory Visit
Admission: EM | Admit: 2023-05-27 | Discharge: 2023-05-27 | Disposition: A | Discharge status: Home / Self Care | Payer: Medicaid Other | Attending: Internal Medicine | Admitting: Internal Medicine

## 2023-05-27 ENCOUNTER — Encounter: Payer: Self-pay | Admitting: Emergency Medicine

## 2023-05-27 DIAGNOSIS — G43009 Migraine without aura, not intractable, without status migrainosus: Secondary | ICD-10-CM

## 2023-05-27 LAB — POCT URINE PREGNANCY: Preg Test, Ur: NEGATIVE

## 2023-05-27 MED ORDER — ONDANSETRON 4 MG PO TBDP
4.0000 mg | ORAL_TABLET | Freq: Once | ORAL | Status: AC
  Administered 2023-05-27: 4 mg via ORAL

## 2023-05-27 MED ORDER — DEXAMETHASONE SODIUM PHOSPHATE 10 MG/ML IJ SOLN
10.0000 mg | Freq: Once | INTRAMUSCULAR | Status: AC
  Administered 2023-05-27: 10 mg via INTRAMUSCULAR

## 2023-05-27 NOTE — ED Triage Notes (Signed)
Had headache/migraine since last night. Been having n/v. Pain more on left side of head esp behind left eye. Hasn't taken medications for pain.

## 2023-05-27 NOTE — ED Provider Notes (Signed)
[facility]    CSN: [id number] Arrival date & time: 05/27/23  1052      History   Chief Complaint Chief Complaint  Patient presents with   Migraine    HPI Allison Bernard is a 27 y.o. female.   Patient presents with headache that started last night around 6 PM.  Patient reports the pain was 8/10 on pain scale last night.  She took some Excedrin with some improvement.  She still currently has a headache that she rates 6/10 on pain scale.  Reports that the pain is present behind her left eye and extends down the lateral portion of her head.  She does have a history of migraines when she was a teenager that seemed to resolve.  Although, she reports that the past few months she has been having intermittent headaches but it is typically not this severe.  Patient reports to me that this feels like the "worst headache she has ever had".  Denies any recent falls or head injuries.  Denies dizziness or blurred vision but does report some nausea with vomiting.   Migraine    Past Medical History:  Diagnosis Date   ADD (attention deficit disorder)    Anemia    Anxiety    Asthma    Depression    no meds, doing ok   Ectopic pregnancy    Ectopic pregnancy    received methotrexate   Headache(784.0)    had migraines when she was younger   Infection    UTI   Kidney infection    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    Normal pregnancy in third trimester 06/09/2016   Oppositional defiant disorder    PAC (premature atrial contraction) 01/29/2016   Palpitations 01/03/2016   Pregnant    PVC (premature ventricular contraction) 01/29/2016   SVD (spontaneous vaginal delivery) 06/10/2016    Patient Active Problem List   Diagnosis Date Noted   Acute appendicitis with localized peritonitis 07/25/2018   Normal pregnancy 06/10/2016   SVD (spontaneous vaginal delivery) 06/10/2016   Normal pregnancy in third trimester 06/09/2016   PAC (premature atrial contraction) 01/29/2016   PVC (premature ventricular  contraction) 01/29/2016   Palpitations 01/03/2016   Panic disorder without agoraphobia 08/21/2014   Major depressive disorder, recurrent episode, moderate (HCC) 08/21/2014   GAD (generalized anxiety disorder) 01/01/2012   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   Depressive disorder, not elsewhere classified 11/02/2011   Anxiety state, unspecified 11/02/2011    Past Surgical History:  Procedure Laterality Date   ANKLE SURGERY Left    reconstruction    LAPAROSCOPIC APPENDECTOMY N/A 07/25/2018   Procedure: APPENDECTOMY LAPAROSCOPIC;  Surgeon: Carolan Shiver, MD;  Location: [facility] ORS;  Service: General;  Laterality: N/A;   MASS EXCISION Left 07/04/2013   Procedure: EXCISION MASS;  Surgeon: Nadara Mustard, MD;  Location: [facility];  Service: Orthopedics;  Laterality: Left;  Excision Left Foot Talo-calcaneous fibrous bar   tubes in ears     in the past   WISDOM TOOTH EXTRACTION      OB History     Gravida  2   Para      Term      Preterm      AB  1   Living  1      SAB      IAB      Ectopic  1   Multiple  0   Live Births  Home Medications    Prior to Admission medications   Medication Sig Start Date End Date Taking? Authorizing Provider  acetaminophen (TYLENOL) 325 MG tablet Take 325-650 mg by mouth every 6 (six) hours as needed for moderate pain or headache.     [provider]  albuterol (VENTOLIN HFA) 108 (90 Base) MCG/ACT inhaler Inhale 2 puffs into the lungs every 6 (six) hours as needed for wheezing or shortness of breath. 12/12/21   Sharyn Creamer, MD  budesonide-formoterol (SYMBICORT) 80-4.5 MCG/ACT inhaler Inhale 2 puffs into the lungs 2 (two) times daily. 12/12/21   Sharyn Creamer, MD  busPIRone (BUSPAR) 10 MG tablet Take 2 tablets (20 mg total) by mouth 2 (two) times daily. 03/05/22   Oletta Darter, MD  fluticasone [facility]) 50 MCG/ACT nasal spray Place into both nostrils daily.    [provider]   ibuprofen (ADVIL,MOTRIN) 200 MG tablet Take 400-800 mg by mouth every 6 (six) hours as needed for mild pain or moderate pain.    [provider]  Levonorgestrel 19.5 MG IUD by Intrauterine route. Was place 11/17    [provider]  PARoxetine (PAXIL) 40 MG tablet Take 1.5 tablets (60 mg total) by mouth daily. 03/05/22 03/05/23  Oletta Darter, MD    Family History Family History  Problem Relation Age of Onset   Depression Mother    Anxiety disorder Mother    Hypertension Father    Diabetes Father    Heart failure Maternal Grandmother    Hypertension Maternal Grandmother    Cancer Maternal Grandmother        4 types   Diabetes Maternal Grandmother    Hypertension Maternal Grandfather    Mesothelioma Maternal Grandfather    Cancer Paternal Grandmother        uterine   Autism Son     Social History Social History   Tobacco Use   Smoking status: Former    Types: E-cigarettes   Smokeless tobacco: Never   Tobacco comments:    quit prior to preg. Now using juice (30ml) that she is using with her e-cigs. It takes about 1 bottle in about 3-4 weeks.   Vaping Use   Vaping status: Never Used  Substance Use Topics   Alcohol use: No    Comment: 1 drink a month   Drug use: No     Allergies   Patient has no known allergies.   Review of Systems Review of Systems Per HPI  Physical Exam Triage Vital Signs ED Triage Vitals  Encounter Vitals Group     BP 05/27/23 1109 102/71     Systolic BP Percentile --      Diastolic BP Percentile --      Pulse Rate 05/27/23 1109 78     Resp 05/27/23 1109 14     Temp 05/27/23 1109 98.6 F (37 C)     Temp Source 05/27/23 1109 Oral     SpO2 05/27/23 1109 97 %     Weight --      Height --      Head Circumference --      Peak Flow --      Pain Score 05/27/23 1108 7     Pain Loc --      Pain Education --      Exclude from Growth Chart --    No data found.  Updated Vital Signs BP 102/71 (BP Location: Left Arm)    Pulse 78   Temp 98.6 F (37 C) (Oral)  Resp 14   SpO2 97%   Visual Acuity Right Eye Distance:   Left Eye Distance:   Bilateral Distance:    Right Eye Near:   Left Eye Near:    Bilateral Near:     Physical Exam Constitutional:      General: She is not in acute distress.    Appearance: Normal appearance. She is not toxic-appearing or diaphoretic.  HENT:     Head: Normocephalic and atraumatic.  Eyes:     Extraocular Movements: Extraocular movements intact.     Conjunctiva/sclera: Conjunctivae normal.     Pupils: Pupils are equal, round, and reactive to light.  Pulmonary:     Effort: Pulmonary effort is normal.  Neurological:     General: No focal deficit present.     Mental Status: She is alert and oriented to person, place, and time. Mental status is at baseline.     Cranial Nerves: Cranial nerves 2-12 are intact.     Sensory: Sensation is intact.     Motor: Motor function is intact.     Coordination: Coordination is intact.     Gait: Gait is intact.  Psychiatric:        Mood and Affect: Mood normal.        Behavior: Behavior normal.        Thought Content: Thought content normal.        Judgment: Judgment normal.      UC Treatments / Results  Labs (all labs ordered are listed, but only abnormal results are displayed) Labs Reviewed  POCT URINE PREGNANCY    EKG   Radiology No results found.  Procedures Procedures (including critical care time)  Medications Ordered in UC Medications  dexamethasone (DECADRON) injection 10 mg (10 mg Intramuscular Given 05/27/23 1146)  ondansetron (ZOFRAN-ODT) disintegrating tablet 4 mg (4 mg Oral Given 05/27/23 1148)    Initial Impression / Assessment and Plan / UC Course  I have reviewed the triage vital signs and the nursing notes.  Pertinent labs & imaging results that were available during my care of the patient were reviewed by me and considered in my medical decision making (see chart for details).     Given  patient is reporting that she has never had a headache this severe, recommended to patient that she go to the emergency department today as CT imaging of the head may be necessary.  Patient was adamant that she did not want to go to the emergency department.  Risks associated with not going to the emergency department discussed with patient.  Patient voiced understanding and accepted risks.  Given patient is declining ER evaluation, will trial IM Decadron and Zofran ODT.  Will avoid ketorolac at this time.  Patient is advised that if medications are not helpful or if headache worsens, she is to go straight to the emergency department for further evaluation.  Patient verbalized understanding and was agreeable with plan. Final Clinical Impressions(s) / UC Diagnoses   Final diagnoses:  Migraine without aura and without status migrainosus, not intractable     Discharge Instructions      Please go straight to the emergency department of symptoms do not improve or if it worsens over the next 24 hours.    ED Prescriptions   None    PDMP not reviewed this encounter.   Gustavus Bryant, [place] 05/27/23 [phone]

## 2023-05-27 NOTE — Discharge Instructions (Signed)
Please go straight to the emergency department of symptoms do not improve or if it worsens over the next 24 hours.

## 2023-06-04 ENCOUNTER — Ambulatory Visit: Payer: Medicaid Other | Admitting: Internal Medicine

## 2023-06-04 NOTE — Progress Notes (Unsigned)
NEW PATIENT Date of Service/Encounter:  06/07/23 Referring provider: none-self referred Primary care provider: Pcp, No  Subjective:  Allison Bernard is a 27 y.o. female with a PMHx of PVCs, ADD, anxiety, depression, migraines presenting today for evaluation of asthma and chronic allergic rhinitis History obtained from: chart review and patient.   Asthma History:  -Diagnosed at age [age] yo.  -Current symptoms include chest tightness, cough, shortness of breath, and wheezing - daily daytime symptoms in past month, 0 nighttime awakenings in past month - Using rescue inhaler using a few times per week -Limitations to daily activity: mild - 1  ED visits (12/12/21-treated with steroids), 0 UC visits and 0 oral steroids in the past year - 0 number of lifetime hospitalizations, 0 number of lifetime intubations.  - Identified Triggers: seasonal changes, dander, dirt/dust, respiratory illness - Does get yearly Covid-19, and Flu, vaccines. - History of prior pneumonias:in childhood - History of prior COVID-19 infection: yes-three times, does well respiratory wise - Smoking exposure: husband vapes, mom smokes Previous Diagnostics:  - Prior PFTs or spirometry: none - Most Recent AEC (2019): 500 -Most Recent Chest Imaging: CXR on (2020): read as normal Management:  - Previously used therapies: none.  - Current regimen:  - Maintenance: symbicort 80 mcg-out of this for the past 3 months, unsure which one - Rescue: Albuterol 2 puffs q4-6 hrs PRN, none prior to exercise  Chronic rhinitis:  started in childhood Symptoms include:  itchy scratchy throat, nasal congestion, and rhinorrhea  Occurs year-round, worse in seasonal change Potential triggers: dander, dust, pollen? Treatments tried: flonase Previous allergy testing: yes-doesn't remember the results History of reflux/heartburn: no Previous sinus, ear, tonsil, adenoid surgeries: ear tubes in childhood She was seeing another allergist,  but was discharged from practice due to missed appointments. She was seeing [facility].  Other allergy screening: Food allergy: no Medication allergy: no Hymenoptera allergy: no Urticaria: no Eczema:no History of recurrent infections suggestive of immunodeficency: no Vaccinations are up to date.   Past Medical History: Past Medical History:  Diagnosis Date   ADD (attention deficit disorder)    Anemia    Anxiety    Asthma    Depression    no meds, doing ok   Ectopic pregnancy    Ectopic pregnancy    received methotrexate   Headache(784.0)    had migraines when she was younger   Infection    UTI   Kidney infection    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    Normal pregnancy in third trimester 06/09/2016   Oppositional defiant disorder    PAC (premature atrial contraction) 01/29/2016   Palpitations 01/03/2016   Pregnant    PVC (premature ventricular contraction) 01/29/2016   SVD (spontaneous vaginal delivery) 06/10/2016   Medication List:  Current Outpatient Medications  Medication Sig Dispense Refill   acetaminophen (TYLENOL) 325 MG tablet Take 325-650 mg by mouth every 6 (six) hours as needed for moderate pain or headache.      albuterol (VENTOLIN HFA) 108 (90 Base) MCG/ACT inhaler Inhale 2 puffs into the lungs every 6 (six) hours as needed for wheezing or shortness of breath. 8.5 g 0   budesonide-formoterol (SYMBICORT) 80-4.5 MCG/ACT inhaler Inhale 2 puffs into the lungs 2 (two) times daily. 1 each 0   busPIRone (BUSPAR) 10 MG tablet Take 2 tablets (20 mg total) by mouth 2 (two) times daily. 120 tablet 2   fluticasone (FLONASE) 50 MCG/ACT nasal spray Place into both nostrils daily.     ibuprofen (ADVIL,MOTRIN) 200  MG tablet Take 400-800 mg by mouth every 6 (six) hours as needed for mild pain or moderate pain.     Levonorgestrel 19.5 MG IUD by Intrauterine route. Was place 11/17     PARoxetine (PAXIL) 40 MG tablet Take 1.5 tablets (60 mg total) by mouth daily. 45 tablet 2   No current  facility-administered medications for this visit.   Known Allergies:  No Known Allergies Past Surgical History: Past Surgical History:  Procedure Laterality Date   ANKLE SURGERY Left    reconstruction    LAPAROSCOPIC APPENDECTOMY N/A 07/25/2018   Procedure: APPENDECTOMY LAPAROSCOPIC;  Surgeon: Carolan Shiver, MD;  Location: [facility] ORS;  Service: General;  Laterality: N/A;   MASS EXCISION Left 07/04/2013   Procedure: EXCISION MASS;  Surgeon: Nadara Mustard, MD;  Location: [facility];  Service: Orthopedics;  Laterality: Left;  Excision Left Foot Talo-calcaneous fibrous bar   tubes in ears     in the past   WISDOM TOOTH EXTRACTION     Family History: Family History  Problem Relation Age of Onset   Depression Mother    Anxiety disorder Mother    Hypertension Father    Diabetes Father    Heart failure Maternal Grandmother    Hypertension Maternal Grandmother    Cancer Maternal Grandmother        4 types   Diabetes Maternal Grandmother    Hypertension Maternal Grandfather    Mesothelioma Maternal Grandfather    Cancer Paternal Grandmother        uterine   Autism Son    Social History: Allison Bernard lives in an apartment built 10 years ago, carpet in bedroom, Architectural technologist, central AC, indoor dog, no roaches, using dust mite protection on the bedding and pillows, she is not a smoker but her boyfriend vapes.  She is in [employer] [place] in her senior year.  No HEPA filter in the home, [place]   ROS:  All other systems negative except as noted per HPI.  Objective:  Blood pressure 124/80, pulse 82, temperature 98.4 F (36.9 C), resp. rate 16, height 5\' 4"  (1.626 m), weight 151 lb (68.5 kg), SpO2 97%. Body mass index is 25.92 kg/m. Physical Exam:  General Appearance:  Alert, cooperative, no distress, appears stated age  Head:  Normocephalic, without obvious abnormality, atraumatic  Eyes:  Conjunctiva clear, EOM's intact  Ears EACs normal bilaterally  and normal TMs bilaterally  Nose: Nares normal, hypertrophic turbinates, normal mucosa, and no visible anterior polyps  Throat: Lips, tongue normal; teeth and gums normal, normal posterior oropharynx  Neck: Supple, symmetrical  Lungs:   clear to auscultation bilaterally, Respirations unlabored, no coughing  Heart:  regular rate and rhythm and no murmur, Appears well perfused  Extremities: No edema  Skin: Skin color, texture, turgor normal and no rashes or lesions on visualized portions of skin  Neurologic: No gross deficits   Diagnostics: Spirometry:  Tracings reviewed. Her effort: Good reproducible efforts. FVC: 4.31L FEV1: 3.35L, 103% predicted FEV1/FVC ratio: 0.78  Interpretation: Spirometry consistent with normal pattern.  Please see scanned spirometry results for details.  Skin Testing: Environmental allergy panel. Adequate positive and negative controls. Results discussed with patient/family.  Airborne Adult Perc - 06/07/23 1429     Time Antigen Placed 1429    Allergen Manufacturer Waynette Buttery    Location Back    Number of Test 55    1. Control-Buffer 50% Glycerol Negative    2. Control-Histamine 2+    3. [place]  Negative    4. French Southern Territories Negative    5. Johnson 3+    6. Kentucky Blue Negative    7. Meadow Fescue Negative    8. Perennial Rye Negative    9. Timothy Negative    10. Ragweed Mix Negative    11. Cocklebur Negative    12. Plantain,  English Negative    13. Baccharis Negative    14. Dog Fennel Negative    15. Russian Thistle Negative    16. Lamb's Quarters Negative    17. Sheep Sorrell 3+    18. Rough Pigweed 2+    19. Marsh Elder, Rough Negative    20. Mugwort, Common Negative    21. Box, Elder Negative    22. Cedar, red 3+    23. Sweet Gum 2+    24. Pecan Pollen 2+    25. Pine Mix 2+    26. Walnut, Black Pollen Negative    27. Red Mulberry Negative    28. Ash Mix 3+    29. Birch Mix Negative    30. Beech American 3+    31. Cottonwood, Guinea[place] Negative     32. Hickory, White Negative    33. Maple Mix Negative    34. Oak, [place] Mix 3+    35. Sycamore Eastern Negative    36. Alternaria Alternata 2+    37. Cladosporium Herbarum 2+    38. Aspergillus Mix 2+    39. Penicillium Mix Negative    40. Bipolaris Sorokiniana (Helminthosporium) Negative    41. Drechslera Spicifera (Curvularia) 2+    42. Mucor Plumbeus Negative    43. Fusarium Moniliforme Negative    44. Aureobasidium Pullulans (pullulara) Negative    45. Rhizopus Oryzae Negative    46. Botrytis Cinera 3+    47. Epicoccum Nigrum 3+    48. Phoma Betae Negative    49. Dust Mite Mix 3+    50. Cat Hair 10,000 BAU/ml 4+    51.  Dog Epithelia Negative    52. Mixed Feathers 3+    53. Horse Epithelia Negative    54. Cockroach, German Negative    55. Tobacco Leaf Negative             Intradermal - 06/07/23 1456     Time Antigen Placed 1456    Allergen Manufacturer Waynette Buttery    Location Arm    Number of Test 8    Control Negative    Bahia Negative    French Southern Territ[place] Negative    7 Grass Negative    Ragweed Mix Negative    Mold 4 Negative    Dog Negative    Cockroach Negative             Allergy testing results were read and interpreted by myself, documented by clinical staff.  Labs:  Lab Orders  No laboratory test(s) ordered today     Assessment and Plan  Mild Persistent Asthma: - your lung testing today looked great - Controller Inhaler: Start Symbicort 2 puffs twice a day; This Should Be Used Everyday - Rinse mouth out after use - During respiratory illness or asthma flares: Increase Symbicort 80 mcg 4 puffs twice daily  and continue for 2 weeks or until symptoms resolve. - Rescue Inhaler: Airsupra 2 puffs. Use  every 4-6 hours as needed for chest tightness, wheezing, or coughing.  Max 12 puffs/day. Can also use 15 minutes prior to exercise if you have symptoms with activity. - Asthma is not controlled  if:  - Symptoms are occurring >2 times a week OR  - >2  times a month nighttime awakenings  - You are requiring systemic steroids (prednisone/steroid injections) more than once per year  - Your require hospitalization for your asthma.  - Please call the clinic to schedule a follow up if these symptoms arise Avoid smoke exposure Stay up-to-date with your annual flu vaccines, COVID vaccines and pneumonia vaccines when indicated.  Chronic Rhinitis: determined to be Seasonal and Perennial Allergic: - allergy testing today: positive to grass, weed and tree pollen, indoor and outdoor molds, dust mites, cat and mixed feathers. - Prevention:  - allergen avoidance when possible - consider allergy shots as long term control of your symptoms by teaching your immune system to be more tolerant of your allergy triggers  -asthma must first be controlled - Symptom control: - Continue Nasal Steroid Spray: Best results if used daily. - Options include Flonase (fluticasone), Nasocort (triamcinolone), Nasonex (mometasome) 1- 2 sprays in each nostril daily.  - All can be purchased over-the-counter if not covered by insurance. - Continue Antihistamine: daily or daily as needed.   -Options include Zyrtec (Cetirizine) 10mg , Claritin (Loratadine) 10mg , Allegra (Fexofenadine) 180mg , or Xyzal (Levocetirinze) 5mg  - Can be purchased over-the-counter if not covered by insurance.  Follow up : 8-10 weeks, sooner if needed It was a pleasure meeting you in clinic today! Thank you for allowing me to participate in your care.    Other: samples provided of: airsupra copay card only  Thank you for your kind referral. I appreciate the opportunity to take part in Cas[place]'s care. Please do not hesitate to contact me with questions.  Sincerely,  Tonny Bollman, MD [facility]

## 2023-06-07 ENCOUNTER — Ambulatory Visit (INDEPENDENT_AMBULATORY_CARE_PROVIDER_SITE_OTHER): Payer: BC Managed Care – PPO | Admitting: Internal Medicine

## 2023-06-07 ENCOUNTER — Other Ambulatory Visit: Payer: Self-pay

## 2023-06-07 ENCOUNTER — Encounter: Payer: Self-pay | Admitting: Internal Medicine

## 2023-06-07 VITALS — BP 124/80 | HR 82 | Temp 98.4°F | Resp 16 | Ht 64.0 in | Wt 151.0 lb

## 2023-06-07 DIAGNOSIS — J302 Other seasonal allergic rhinitis: Secondary | ICD-10-CM | POA: Diagnosis not present

## 2023-06-07 DIAGNOSIS — J4530 Mild persistent asthma, uncomplicated: Secondary | ICD-10-CM

## 2023-06-07 DIAGNOSIS — J3089 Other allergic rhinitis: Secondary | ICD-10-CM | POA: Diagnosis not present

## 2023-06-07 MED ORDER — FLUTICASONE PROPIONATE 50 MCG/ACT NA SUSP: 1.0000 | Freq: Every day | NASAL | 5 refills | Status: AC

## 2023-06-07 MED ORDER — CETIRIZINE HCL 10 MG PO TABS: 10.0000 mg | ORAL_TABLET | Freq: Every day | ORAL | 5 refills | Status: AC | PRN

## 2023-06-07 MED ORDER — BUDESONIDE-FORMOTEROL FUMARATE 80-4.5 MCG/ACT IN AERO: 2.0000 | INHALATION_SPRAY | Freq: Two times a day (BID) | RESPIRATORY_TRACT | 5 refills | Status: DC

## 2023-06-07 MED ORDER — AIRSUPRA 90-80 MCG/ACT IN AERO: 2.0000 | INHALATION_SPRAY | RESPIRATORY_TRACT | 2 refills | Status: DC | PRN

## 2023-06-07 NOTE — Patient Instructions (Addendum)
Mild Persistent Asthma: - your lung testing today looked great - Controller Inhaler: Start Symbicort 2 puffs twice a day; This Should Be Used Everyday - Rinse mouth out after use - During respiratory illness or asthma flares: Increase Symbicort 80 mcg 4 puffs twice daily  and continue for 2 weeks or until symptoms resolve. - Rescue Inhaler: Airsupra 2 puffs. Use  every 4-6 hours as needed for chest tightness, wheezing, or coughing.  Max 12 puffs/day. Can also use 15 minutes prior to exercise if you have symptoms with activity. - Asthma is not controlled if:  - Symptoms are occurring >2 times a week OR  - >2 times a month nighttime awakenings  - You are requiring systemic steroids (prednisone/steroid injections) more than once per year  - Your require hospitalization for your asthma.  - Please call the clinic to schedule a follow up if these symptoms arise Avoid smoke exposure Stay up-to-date with your annual flu vaccines, COVID vaccines and pneumonia vaccines when indicated.  Chronic Rhinitis: determined to be Seasonal and Perennial Allergic: - allergy testing today: positive to grass, weed and tree pollen, indoor and outdoor molds, dust mites, cat and mixed feathers. - Prevention:  - allergen avoidance when possible - consider allergy shots as long term control of your symptoms by teaching your immune system to be more tolerant of your allergy triggers  -asthma must first be controlled - Symptom control: - Continue Nasal Steroid Spray: Best results if used daily. - Options include Flonase (fluticasone), Nasocort (triamcinolone), Nasonex (mometasome) 1- 2 sprays in each nostril daily.  - All can be purchased over-the-counter if not covered by insurance. - Continue Antihistamine: daily or daily as needed.   -Options include Zyrtec (Cetirizine) 10mg , Claritin (Loratadine) 10mg , Allegra (Fexofenadine) 180mg , or Xyzal (Levocetirinze) 5mg  - Can be purchased over-the-counter if not  covered by insurance.  Follow up : 8-10 weeks, sooner if needed It was a pleasure meeting you in clinic today! Thank you for allowing me to participate in your care.  Tonny Bollman, MD [facility]  Reducing Pollen Exposure  The American Academy of Allergy, Asthma and Immunology suggests the following steps to reduce your exposure to pollen during allergy seasons.    Do not hang sheets or clothing out to dry; pollen may collect on these items. Do not mow lawns or spend time around freshly cut grass; mowing stirs up pollen. Keep windows closed at night.  Keep car windows closed while driving. Minimize morning activities outdoors, a time when pollen counts are usually at their highest. Stay indoors as much as possible when pollen counts or humidity is high and on windy days when pollen tends to remain in the air longer. Use air conditioning when possible.  Many air conditioners have filters that trap the pollen spores. Use a HEPA room air filter to remove pollen form the indoor air you breathe. DUST MITE AVOIDANCE MEASURES:  There are three main measures that need and can be taken to avoid house dust mites:  Reduce accumulation of dust in general -reduce furniture, clothing, carpeting, books, stuffed animals, especially in bedroom  Separate yourself from the dust -use pillow and mattress encasements (can be found at stores such as [employer] or online) -avoid direct exposure to air condition flow -use a HEPA filter device, especially in the bedroom; you can also use a HEPA filter vacuum cleaner -wipe dust with a moist towel instead of a dry towel or broom when cleaning  Decrease mites and/or their secretions -wash clothing and linen and stuffed animals at highest temperature possible, at least every 2 weeks -stuffed animals can also be placed in a bag and put in a freezer overnight  Despite the above measures, it is impossible to eliminate dust mites or  their allergen completely from your home.  With the above measures the burden of mites in your home can be diminished, with the goal of minimizing your allergic symptoms.  Success will be reached only when implementing and using all means together. Control of Mold Allergen   Mold and fungi can grow on a variety of surfaces provided certain temperature and moisture conditions exist.  Outdoor molds grow on plants, decaying vegetation and soil.  The major outdoor mold, Alternaria and Cladosporium, are found in very high numbers during hot and dry conditions.  Generally, a late Summer - Fall peak is seen for common outdoor fungal spores.  Rain will temporarily lower outdoor mold spore count, but counts rise rapidly when the rainy period ends.  The most important indoor molds are Aspergillus and Penicillium.  Dark, humid and poorly ventilated basements are ideal sites for mold growth.  The next most common sites of mold growth are the bathroom and the kitchen.  Outdoor (Seasonal) Mold Control  Use air conditioning and keep windows closed Avoid exposure to decaying vegetation. Avoid leaf raking. Avoid grain handling. Consider wearing a face mask if working in moldy areas.    Indoor (Perennial) Mold Control   Maintain humidity below 50%. Clean washable surfaces with 5% bleach solution. Remove sources e.g. contaminated carpets.   Control of Dog or Cat Allergen  Avoidance is the best way to manage a dog or cat allergy. If you have a dog or cat and are allergic to dog or cats, consider removing the dog or cat from the home. If you have a dog or cat but don't want to find it a new home, or if your family wants a pet even though someone in the household is allergic, here are some strategies that may help keep symptoms at bay:  Keep the pet out of your bedroom and restrict it to only a few rooms. Be advised that keeping the dog or cat in only one room will not limit the allergens to that room. Don't  pet, hug or kiss the dog or cat; if you do, wash your hands with soap and water. High-efficiency particulate air (HEPA) cleaners run continuously in a bedroom or living room can reduce allergen levels over time. Regular use of a high-efficiency vacuum cleaner or a central vacuum can reduce allergen levels. Giving your dog or cat a bath at least once a week can reduce airborne allergen.

## 2023-07-22 ENCOUNTER — Telehealth: Payer: Self-pay

## 2023-07-22 NOTE — Telephone Encounter (Signed)
No she did not fail them just change in theraphy

## 2023-07-22 NOTE — Telephone Encounter (Signed)
*  Asthma/Allergy  Prior Authorization form/request asks a question that requires your assistance. Please see the question below and advise accordingly.   Has patient failed any preferred alternatives:  Albuterol HFA, Symbicort HFA

## 2023-07-22 NOTE — Telephone Encounter (Signed)
Did patient FAIL those alternatives?

## 2023-07-22 NOTE — Telephone Encounter (Signed)
Pharmacy Patient Advocate Encounter   Received notification from CoverMyMeds that prior authorization for Airsupra 90-80MCG/ACT aerosol  is required/requested.   Insurance verification completed.   The patient is insured through [facility] .   Per test claim: PA required; PA submitted to [facility] via CoverMyMeds Key/confirmation #[id number] Status is pending

## 2023-07-22 NOTE — Telephone Encounter (Signed)
Pt has only been on symbicort and albuterol

## 2023-07-23 NOTE — Telephone Encounter (Signed)
Pharmacy Patient Advocate Encounter  Received notification from [facility] that Prior Authorization for Allison Bernard has been DENIED.  Full denial letter will be uploaded to the media tab. See denial reason below.   PA #/Case ID/Reference #: [employer] reviewed your AIRSUPRA 90-80 MCG INHALER request for the above-identified member, and it is denied for the following reason: because we did not see certain details about your use and treatment. We see that this request is for a drug called Airsupra for your use (mild persistent asthma, uncomplicated). We may consider approval of this drug after a trial of  certain other drugs first (a trial and failure of two formulary preferred drugs, such as Advair  Diskus, Advair HFA, Dulera, or Symbicort). We did not see records that you tried and did not  respond well to two of these drugs first or that you cannot use them for certain reasons (such as a drug-drug interaction or adverse drug experience). We based this decision on your health plan's prior authorization criteria named Preferred Drug List

## 2023-07-26 MED ORDER — AIRSUPRA 90-80 MCG/ACT IN AERO: 2.0000 | INHALATION_SPRAY | RESPIRATORY_TRACT | 2 refills | Status: AC | PRN

## 2023-07-26 NOTE — Addendum Note (Signed)
Addended by: Berna Bue on: 07/26/2023 11:03 AM   Modules accepted: Orders

## 2023-07-26 NOTE — Telephone Encounter (Signed)
Pharmacy notified to use savings card

## 2023-07-28 ENCOUNTER — Other Ambulatory Visit (HOSPITAL_COMMUNITY): Payer: Self-pay | Admitting: Psychiatry

## 2023-07-28 DIAGNOSIS — F411 Generalized anxiety disorder: Secondary | ICD-10-CM

## 2023-07-28 DIAGNOSIS — F330 Major depressive disorder, recurrent, mild: Secondary | ICD-10-CM

## 2023-07-28 DIAGNOSIS — F410 Panic disorder [episodic paroxysmal anxiety] without agoraphobia: Secondary | ICD-10-CM

## 2023-07-31 DIAGNOSIS — Z760 Encounter for issue of repeat prescription: Secondary | ICD-10-CM | POA: Diagnosis not present

## 2023-07-31 DIAGNOSIS — Z23 Encounter for immunization: Secondary | ICD-10-CM | POA: Diagnosis not present

## 2023-07-31 DIAGNOSIS — F32A Depression, unspecified: Secondary | ICD-10-CM | POA: Diagnosis not present

## 2023-07-31 DIAGNOSIS — Z6826 Body mass index (BMI) 26.0-26.9, adult: Secondary | ICD-10-CM | POA: Diagnosis not present

## 2023-08-02 ENCOUNTER — Ambulatory Visit: Payer: BC Managed Care – PPO | Admitting: Internal Medicine

## 2024-03-03 ENCOUNTER — Telehealth

## 2024-03-03 DIAGNOSIS — F339 Major depressive disorder, recurrent, unspecified: Secondary | ICD-10-CM | POA: Diagnosis not present

## 2024-03-03 MED ORDER — PAROXETINE HCL 40 MG PO TABS: 60.0000 mg | ORAL_TABLET | ORAL | 1 refills | Status: DC

## 2024-03-03 NOTE — Patient Instructions (Signed)

## 2024-03-03 NOTE — Progress Notes (Signed)
 Virtual Visit Consent   Allison Bernard, you are scheduled for a virtual visit with a [facility] provider today. Just as with appointments in the office, your consent must be obtained to participate. Your consent will be active for this visit and any virtual visit you may have with one of our providers in the next 365 days. If you have a MyChart account, a copy of this consent can be sent to you electronically.  As this is a virtual visit, video technology does not allow for your provider to perform a traditional examination. This may limit your provider's ability to fully assess your condition. If your provider identifies any concerns that need to be evaluated in person or the need to arrange testing (such as labs, EKG, etc.), we will make arrangements to do so. Although advances in technology are sophisticated, we cannot ensure that it will always work on either your end or our end. If the connection with a video visit is poor, the visit may have to be switched to a telephone visit. With either a video or telephone visit, we are not always able to ensure that we have a secure connection.  By engaging in this virtual visit, you consent to the provision of healthcare and authorize for your insurance to be billed (if applicable) for the services provided during this visit. Depending on your insurance coverage, you may receive a charge related to this service.  I need to obtain your verbal consent now. Are you willing to proceed with your visit today? Allison Bernard has provided verbal consent on 03/03/2024 for a virtual visit (video or telephone). Albertha Huger, FNP  Date: 03/03/2024 10:16 AM   Virtual Visit via Video Note   I, Albertha Huger, connected with  Allison Bernard  ([id number], 1996-06-21) on 03/03/24 at  9:00 AM EDT by a video-enabled telemedicine application and verified that I am speaking with the correct person using two identifiers.  Location: Patient: Virtual Visit Location Patient:  Home Provider: Virtual Visit Location Provider: Home Office   I discussed the limitations of evaluation and management by telemedicine and the availability of in person appointments. The patient expressed understanding and agreed to proceed.    History of Present Illness: STORMIE VENTOLA is a 28 y.o. who identifies as a female who was assigned female at birth, and is being seen today for a refill on paxil . She just graduated from college and has apptmt with pcp in July but needs refills until then since college can't refill anymore. She is stable on meds per patient. Aaron Aas  HPI: HPI  Problems:  Patient Active Problem List   Diagnosis Date Noted   Mild persistent asthma without complication [J45.30] 06/07/2023   Seasonal and perennial allergic rhinitis 06/07/2023   Acute appendicitis with localized peritonitis 07/25/2018   Normal pregnancy 06/10/2016   SVD (spontaneous vaginal delivery) 06/10/2016   Normal pregnancy in third trimester 06/09/2016   PAC (premature atrial contraction) 01/29/2016   PVC (premature ventricular contraction) 01/29/2016   Palpitations 01/03/2016   Panic disorder without agoraphobia 08/21/2014   Major depressive disorder, recurrent episode, moderate (HCC) 08/21/2014   GAD (generalized anxiety disorder) 01/01/2012   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 , Disp: , Rfl:    albuterol  (VENTOLIN  HFA) 108 (90 Base) MCG/ACT inhaler, Inhale 2 puffs into the lungs every 6 (six) hours as needed for wheezing or shortness of breath., Disp: 8.5 g,  Rfl: 0   Albuterol -Budesonide  (AIRSUPRA ) 90-80 MCG/ACT AERO, Inhale 2 puffs into the lungs as needed (maximum 12 puffs/day)., Disp: 10.7 g, Rfl: 2   budesonide -formoterol  (SYMBICORT ) 80-4.5 MCG/ACT inhaler, Inhale 2 puffs into the lungs 2 (two) times daily., Disp: 1 each, Rfl: 5   busPIRone  (BUSPAR ) 10 MG tablet, Take 2 tablets (20 mg total) by mouth 2 (two) times daily., Disp: 120 tablet, Rfl: 2   cetirizine  (ZYRTEC  ALLERGY ) 10 MG tablet, Take 1 tablet (10 mg total) by mouth daily as needed for allergies., Disp: 325 tablet, Rfl: 5   fluticasone  (FLONASE ) 50 MCG/ACT nasal spray, Place 1 spray into both nostrils daily., Disp: 16 g, Rfl: 5   ibuprofen  (ADVIL ,MOTRIN ) 200 MG tablet, Take 400-800 mg by mouth every 6 (six) hours as needed for mild pain or moderate pain., Disp: , Rfl:    Levonorgestrel 19.5 MG IUD, by Intrauterine route. Was place 11/17, Disp: , Rfl:    PARoxetine  (PAXIL ) 40 MG tablet, Take 1.5 tablets (60 mg total) by mouth daily., Disp: 45 tablet, Rfl: 2  Observations/Objective: Patient is well-developed, well-nourished in no acute distress.  Resting comfortably  at home.  Head is normocephalic, atraumatic.  No labored breathing.  Speech is clear and coherent with logical content.  Patient is alert and oriented at baseline.    Assessment and Plan: There are no diagnoses linked to this encounter. PCP in July. UC as needed for problems.   Follow Up Instructions: I discussed the assessment and treatment plan with the patient. The patient was provided an opportunity to ask questions and all were answered. The patient agreed with the plan and demonstrated an understanding of the instructions.  A copy of instructions were sent to the patient via MyChart unless otherwise noted below.     The patient was advised to call back or seek an in-person evaluation if the symptoms worsen or if the condition fails to improve as anticipated.    Tonie Elsey, FNP

## 2024-03-20 ENCOUNTER — Ambulatory Visit: Admitting: Internal Medicine

## 2024-04-20 ENCOUNTER — Ambulatory Visit: Admitting: Family Medicine

## 2024-05-26 ENCOUNTER — Ambulatory Visit (INDEPENDENT_AMBULATORY_CARE_PROVIDER_SITE_OTHER): Admitting: Family Medicine

## 2024-05-26 ENCOUNTER — Encounter: Payer: Self-pay | Admitting: Family Medicine

## 2024-05-26 VITALS — BP 106/77 | HR 70 | Ht 63.0 in | Wt 169.7 lb

## 2024-05-26 DIAGNOSIS — R635 Abnormal weight gain: Secondary | ICD-10-CM | POA: Diagnosis not present

## 2024-05-26 DIAGNOSIS — Z1159 Encounter for screening for other viral diseases: Secondary | ICD-10-CM | POA: Diagnosis not present

## 2024-05-26 DIAGNOSIS — Z975 Presence of (intrauterine) contraceptive device: Secondary | ICD-10-CM

## 2024-05-26 DIAGNOSIS — Z862 Personal history of diseases of the blood and blood-forming organs and certain disorders involving the immune mechanism: Secondary | ICD-10-CM | POA: Diagnosis not present

## 2024-05-26 DIAGNOSIS — E6609 Other obesity due to excess calories: Secondary | ICD-10-CM | POA: Diagnosis not present

## 2024-05-26 DIAGNOSIS — F419 Anxiety disorder, unspecified: Secondary | ICD-10-CM

## 2024-05-26 DIAGNOSIS — N926 Irregular menstruation, unspecified: Secondary | ICD-10-CM | POA: Diagnosis not present

## 2024-05-26 DIAGNOSIS — F902 Attention-deficit hyperactivity disorder, combined type: Secondary | ICD-10-CM

## 2024-05-26 DIAGNOSIS — Z6830 Body mass index (BMI) 30.0-30.9, adult: Secondary | ICD-10-CM

## 2024-05-26 DIAGNOSIS — F339 Major depressive disorder, recurrent, unspecified: Secondary | ICD-10-CM

## 2024-05-26 DIAGNOSIS — F913 Oppositional defiant disorder: Secondary | ICD-10-CM | POA: Diagnosis not present

## 2024-05-26 DIAGNOSIS — Z23 Encounter for immunization: Secondary | ICD-10-CM | POA: Diagnosis not present

## 2024-05-26 DIAGNOSIS — J4530 Mild persistent asthma, uncomplicated: Secondary | ICD-10-CM

## 2024-05-26 DIAGNOSIS — E66811 Obesity, class 1: Secondary | ICD-10-CM | POA: Diagnosis not present

## 2024-05-26 DIAGNOSIS — Z7689 Persons encountering health services in other specified circumstances: Secondary | ICD-10-CM

## 2024-05-26 MED ORDER — PAROXETINE HCL 20 MG PO TABS
20.0000 mg | ORAL_TABLET | Freq: Every day | ORAL | 1 refills | Status: AC
Start: 2024-05-26 — End: ?

## 2024-05-26 NOTE — Progress Notes (Signed)
 New Patient Office Visit  Introduced to nurse practitioner role and practice setting.  All questions answered.  Discussed provider/patient relationship and expectations.   Subjective    Patient ID: Allison Bernard, female    DOB: [birth year]  Age: 28 y.o. MRN: [id number]  CC:  Chief Complaint  Patient presents with   New Patient (Initial Visit)    Patient is present to establish care and would like to see about IUD removal and would like to know the different pain mgmt options with removal.    Gynecologic Exam    Patient reports last one was 3 years ago and would like a referral placed to [facility] for [facility] @ [address] [place]   Immunizations    Hepatitis B Vaccines - Shows in [place] HPV Vaccines - Shows in [facility] Influenza Vaccine - ok to administer Pneumococcal Vaccine (hx of asthma) - ok to administer    HPI  Discussed the use of AI scribe software for clinical note transcription with the patient, who gave verbal consent to proceed.  History of Present Illness Allison Bernard is a 28 year old female who presents to establish care and discuss medication management.  She has a history of anemia, anxiety, depression, ADD, and oppositional defiant disorder (ODD). She is currently taking Paxil  20 mg at night. She previously used Buspar  but found it ineffective and has discontinued it. She is interested in being evaluated for autism, as her son has been diagnosed with autism, and she feels her symptoms may align with this diagnosis.  Her anemia was diagnosed during her teenage years due to menorrhagia, but she has never required blood transfusions. She experienced a hemorrhage post-childbirth, which may have required a transfusion.  She has a history of migraines, which have recently returned, occurring about once a week. These are manageable with Tylenol  and rest, though dehydration exacerbates her headaches.  She has a history of two pregnancies, one of which was  ectopic. She currently has a Kyleena IUD, which she believes may be expired, and is considering its removal. She wants to understand her menstrual cycle better and reports irregular periods and weight gain. She is not interested in replacing the IUD at this time.  Her family history includes multiple cancers on her maternal side, including stomach, uterine, and breast cancer in her maternal grandmother, and lung cancer in her maternal grandfather. Both her parents have type 2 diabetes, and her sister has hypermobile Ehlers-Danlos syndrome (HEDS).  Socially, she drinks alcohol about once a month and quit tobacco use in 2016. She is married with one child and feels safe at home. She is physically active, engaging in exercise about three times a week, often through vigorous play with her son.  Her current medications include Paxil  20 mg at night, Symbicort  daily, and as-needed use of Tylenol , Zyrtec , and Flonase . She is working on obtaining a new rescue inhaler recommended by her allergist.    Outpatient Encounter Medications as of 05/26/2024  Medication Sig   acetaminophen  (TYLENOL ) 325 MG tablet Take 325-650 mg by mouth every 6 (six) hours as needed for moderate pain or headache.    Albuterol -Budesonide  (AIRSUPRA ) 90-80 MCG/ACT AERO Inhale 2 puffs into the lungs as needed (maximum 12 puffs/day).   budesonide -formoterol  (SYMBICORT ) 80-4.5 MCG/ACT inhaler Inhale 2 puffs into the lungs 2 (two) times daily.   cetirizine  (ZYRTEC  ALLERGY ) 10 MG tablet Take 1 tablet (10 mg total) by mouth daily as needed for allergies.  fluticasone  (FLONASE ) 50 MCG/ACT nasal spray Place 1 spray into both nostrils daily.   ibuprofen  (ADVIL ,MOTRIN ) 200 MG tablet Take 400-800 mg by mouth every 6 (six) hours as needed for mild pain or moderate pain.   Levonorgestrel 19.5 MG IUD by Intrauterine route. Was place 11/17   PARoxetine  (PAXIL ) 20 MG tablet Take 1 tablet (20 mg total) by mouth daily.   [DISCONTINUED] PARoxetine   (PAXIL ) 40 MG tablet Take 1.5 tablets (60 mg total) by mouth daily. (Patient taking differently: Take 60 mg by mouth daily. Taking 0.5 tablets daily)   [DISCONTINUED] albuterol  (VENTOLIN  HFA) 108 (90 Base) MCG/ACT inhaler Inhale 2 puffs into the lungs every 6 (six) hours as needed for wheezing or shortness of breath. (Patient not taking: Reported on 05/26/2024)   [DISCONTINUED] busPIRone  (BUSPAR ) 10 MG tablet Take 2 tablets (20 mg total) by mouth 2 (two) times daily. (Patient not taking: Reported on 05/26/2024)   [DISCONTINUED] PARoxetine  (PAXIL ) 40 MG tablet Take 1.5 tablets (60 mg total) by mouth every morning. (Patient not taking: Reported on 05/26/2024)   No facility-administered encounter medications on file as of 05/26/2024.    Past Medical History:  Diagnosis Date   ADD (attention deficit disorder)    Anemia    Anxiety    Asthma    Depression    no meds, doing ok   Ectopic pregnancy    Ectopic pregnancy    received methotrexate    Headache(784.0)    had migraines when she was younger   Infection    UTI   Kidney infection    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    Normal pregnancy in third trimester 06/09/2016   Oppositional defiant disorder    PAC (premature atrial contraction) 01/29/2016   Palpitations 01/03/2016   Pregnant    PVC (premature ventricular contraction) 01/29/2016   SVD (spontaneous vaginal delivery) 06/10/2016    Past Surgical History:  Procedure Laterality Date   ANKLE SURGERY Left    reconstruction    APPENDECTOMY     LAPAROSCOPIC APPENDECTOMY N/A 07/25/2018   Procedure: APPENDECTOMY LAPAROSCOPIC;  Surgeon: Rodolph Romano, MD;  Location: [facility] ORS;  Service: General;  Laterality: N/A;   MASS EXCISION Left 07/04/2013   Procedure: EXCISION MASS;  Surgeon: Jerona LULLA Sage, MD;  Location: [facility] OR;  Service: Orthopedics;  Laterality: Left;  Excision Left Foot Talo-calcaneous fibrous bar   tubes in ears     in the past   WISDOM TOOTH EXTRACTION      Family History  Problem Relation  Age of Onset   Depression Mother    Anxiety disorder Mother    Miscarriages / Stillbirths Mother    Obesity Mother    Hypertension Father    Diabetes Father    Ehlers-Danlos syndrome Sister    Heart failure Maternal Grandmother    Hypertension Maternal Grandmother    Cancer Maternal Grandmother        4 types   Diabetes Maternal Grandmother    Arthritis Maternal Grandmother    Kidney disease Maternal Grandmother    Hypertension Maternal Grandfather    Mesothelioma Maternal Grandfather    Obesity Maternal Grandfather    Stroke Maternal Grandfather    Cancer Maternal Grandfather        lung   Cancer Paternal Grandmother        uterine   COPD Paternal Grandfather    Autism Son     Social History   Socioeconomic History   Marital status: Married    Spouse name: Not on file  Number of children: Not on file   Years of education: Not on file   Highest education level: Bachelor's degree (e.g., BA, AB, BS)  Occupational History   Not on file  Tobacco Use   Smoking status: Former    Current packs/day: 0.00    Types: E-cigarettes, Cigarettes    Start date: 09/29/2011    Quit date: 09/12/2015    Years since quitting: 8.7   Smokeless tobacco: Never  Vaping Use   Vaping status: Never Used  Substance and Sexual Activity   Alcohol use: No    Comment: 1 drink a month   Drug use: No   Sexual activity: Yes    Birth control/protection: I.U.D.  Other Topics Concern   Not on file  Social History Narrative   Not on file   Social Drivers of Health   Financial Resource Strain: Medium Risk (05/25/2024)   Overall Financial Resource Strain (CARDIA)    Difficulty of Paying Living Expenses: Somewhat hard  Food Insecurity: No Food Insecurity (05/25/2024)   Hunger Vital Sign    Worried About Running Out of Food in the Last Year: Never true    Ran Out of Food in the Last Year: Never true  Transportation Needs: Unmet Transportation Needs (05/25/2024)   PRAPARE - Doctor, general practice (Medical): Yes    Lack of Transportation (Non-Medical): No  Physical Activity: Insufficiently Active (05/25/2024)   Exercise Vital Sign    Days of Exercise per Week: 3 days    Minutes of Exercise per Session: 10 min  Stress: Stress Concern Present (05/25/2024)   [employer] of Occupational Health - Occupational Stress Questionnaire    Feeling of Stress: Rather much  Social Connections: Moderately Integrated (05/25/2024)   Social Connection and Isolation Panel    Frequency of Communication with Friends and Family: More than three times a week    Frequency of Social Gatherings with Friends and Family: Twice a week    Attends Religious Services: Never    Database administrator or Organizations: Yes    Attends Engineer, structural: More than 4 times per year    Marital Status: Married  Catering manager Violence: Not on file    ROS     Objective    BP 106/77 (BP Location: Right Arm, Patient Position: Sitting, Cuff Size: Normal)   Pulse 70   Ht 5' 3 (1.6 m)   Wt 169 lb 11.2 oz (77 kg)   SpO2 99%   BMI 30.06 kg/m      Assessment & Plan:  Assessment and Plan Assessment & Plan Establishing care Initial visit to establish care with family practice.  Irregular menstruation and expired intrauterine device Davia) Irregular menstruation possibly due to expired Kyleena IUD, typically effective for 5 years. She desires better understanding of her cycle and is considering IUD removal and potential replacement - Refer to [facility] in [place]- would like a midwife  Weight gain and class 1 obesity Weight gain possibly related to Paxil  use and family history of diabetes.  She is content with some weight gain as she was previously borderline malnourished. Increase weekly purposeful movement - 20-30 minutes per day - Order lipid panel, diabetes screening, and thyroid function tests  Depression and anxiety disorder, stable on  Paxil  Depression and anxiety well-managed on Paxil  20 mg nightly.  Previous higher doses interfered with intimate life. She is satisfied with the current dose. - Continue Paxil  20mg  - has resources  for CBT  HX of ADHD and oppositional defiant disorder, evaluation for possible ASD Considering evaluation for autism spectrum disorder due to son's diagnosis and overlapping symptoms. Previous evaluation attempts were unsuccessful. - Refer to psychiatry for formal autism spectrum disorder workup  Asthma, mild and controlled Mild asthma controlled with Symbicort  daily. Working on Therapist, occupational for Airsupra , a combination rescue inhaler with steroid, in coordination with allergist Rocky Endow. - Continue Symbicort  daily - Continue to use prn albuterol , until approved for Airsupra  - Continue to be managed by Allergist  Recurrent headaches Recurrent headaches approximately once a week, manageable with Tylenol  and rest.  Possibly related to hydration status. - Continue symptomatic management with Tylenol  and rest  History of anemia Anemia related to heavy menstrual periods in adolescence. - Will check CBC, B12  General Health Maintenance Discussion of vaccinations and general health screenings. - Administer pneumococcal and influenza vaccines, today, consent received - F/u 3 months   Abnormal menstrual periods -     TSH  Weight gain -     TSH  Class 1 obesity due to excess calories without serious comorbidity with body mass index (BMI) of 30.0 to 30.9 in adult -     CBC -     Comprehensive metabolic panel with GFR -     VITAMIN D 25 Hydroxy (Vit-D Deficiency, Fractures) -     Lipid panel  Attention deficit hyperactivity disorder (ADHD), combined type -     Ambulatory referral to Psychiatry  Oppositional defiant disorder -     Ambulatory referral to Psychiatry  Depression, recurrent (HCC) -     Ambulatory referral to Psychiatry -     PARoxetine  HCl; Take 1 tablet (20 mg  total) by mouth daily.  Dispense: 90 tablet; Refill: 1  Anxiety and depression  IUD (intrauterine device) in place -     Ambulatory referral to Obstetrics / Gynecology  Mild persistent asthma without complication [J45.30]  Screening for viral disease -     Hemoglobin A1c -     Hepatitis C antibody  History of anemia -     CBC -     Vitamin B12  Immunization due -     Flu vaccine trivalent PF, 6mos and older(Flulaval,Afluria,Fluarix,Fluzone) -     Pneumococcal conjugate vaccine 20-valent  Encounter to establish care with new doctor    Return in about 3 months (around 08/28/2024) for Check in with mood and referrals.   Curtis DELENA Boom, FNP

## 2024-05-27 LAB — LIPID PANEL
Chol/HDL Ratio: 3.6 ratio (ref 0.0–4.4)
Cholesterol, Total: 170 mg/dL (ref 100–199)
HDL: 47 mg/dL (ref >39)
LDL Chol Calc (NIH): 109 mg/dL — ABNORMAL HIGH (ref 0–99)
Triglycerides: 73 mg/dL (ref 0–149)
VLDL Cholesterol Cal: 14 mg/dL (ref 5–40)

## 2024-05-27 LAB — VITAMIN B12: Vitamin B-12: 318 pg/mL (ref 232–1245)

## 2024-05-27 LAB — COMPREHENSIVE METABOLIC PANEL WITH GFR
ALT: 6 IU/L (ref 0–32)
AST: 12 IU/L (ref 0–40)
Albumin: 4.6 g/dL (ref 4.0–5.0)
Alkaline Phosphatase: 63 IU/L (ref 44–121)
BUN/Creatinine Ratio: 13 (ref 9–23)
BUN: 14 mg/dL (ref 6–20)
Bilirubin Total: 0.3 mg/dL (ref 0.0–1.2)
CO2: 22 mmol/L (ref 20–29)
Calcium: 9.9 mg/dL (ref 8.7–10.2)
Chloride: 100 mmol/L (ref 96–106)
Creatinine, Ser: 1.04 mg/dL — ABNORMAL HIGH (ref 0.57–1.00)
Globulin, Total: 2.7 g/dL (ref 1.5–4.5)
Glucose: 86 mg/dL (ref 70–99)
Potassium: 4.6 mmol/L (ref 3.5–5.2)
Sodium: 136 mmol/L (ref 134–144)
Total Protein: 7.3 g/dL (ref 6.0–8.5)
eGFR: 75 mL/min/1.73 (ref >59)

## 2024-05-27 LAB — CBC
Hematocrit: 43 % (ref 34.0–46.6)
Hemoglobin: 14.2 g/dL (ref 11.1–15.9)
MCH: 28.3 pg (ref 26.6–33.0)
MCHC: 33 g/dL (ref 31.5–35.7)
MCV: 86 fL (ref 79–97)
Platelets: 333 x10E3/uL (ref 150–450)
RBC: 5.01 x10E6/uL (ref 3.77–5.28)
RDW: 13.2 % (ref 11.7–15.4)
WBC: 6.2 x10E3/uL (ref 3.4–10.8)

## 2024-05-27 LAB — HEMOGLOBIN A1C
Est. average glucose Bld gHb Est-mCnc: 103 mg/dL
Hgb A1c MFr Bld: 5.2 % (ref 4.8–5.6)

## 2024-05-27 LAB — HEPATITIS C ANTIBODY: Hep C Virus Ab: NONREACTIVE

## 2024-05-27 LAB — TSH: TSH: 1.78 u[IU]/mL (ref 0.450–4.500)

## 2024-05-27 LAB — VITAMIN D 25 HYDROXY (VIT D DEFICIENCY, FRACTURES): Vit D, 25-Hydroxy: 20.3 ng/mL — ABNORMAL LOW (ref 30.0–100.0)

## 2024-05-28 ENCOUNTER — Ambulatory Visit: Payer: Self-pay | Admitting: Family Medicine

## 2024-05-31 ENCOUNTER — Ambulatory Visit: Admitting: Family Medicine

## 2024-06-02 ENCOUNTER — Other Ambulatory Visit: Payer: Self-pay | Admitting: Internal Medicine

## 2024-07-07 ENCOUNTER — Ambulatory Visit: Admitting: Family Medicine
